# Patient Record
Sex: Female | Born: 1935 | Race: White | Hispanic: No | Smoking: Never smoker
Health system: Southern US, Community
[De-identification: ages and names within clinical notes are randomized; demographics above are authoritative.]

## PROBLEM LIST (undated history)

## (undated) DIAGNOSIS — G7000 Myasthenia gravis without (acute) exacerbation: Secondary | ICD-10-CM

## (undated) DIAGNOSIS — J309 Allergic rhinitis, unspecified: Secondary | ICD-10-CM

## (undated) DIAGNOSIS — I10 Essential (primary) hypertension: Secondary | ICD-10-CM

## (undated) DIAGNOSIS — C50919 Malignant neoplasm of unspecified site of unspecified female breast: Secondary | ICD-10-CM

## (undated) DIAGNOSIS — E7800 Pure hypercholesterolemia, unspecified: Secondary | ICD-10-CM

## (undated) DIAGNOSIS — C801 Malignant (primary) neoplasm, unspecified: Secondary | ICD-10-CM

## (undated) DIAGNOSIS — Z923 Personal history of irradiation: Secondary | ICD-10-CM

## (undated) DIAGNOSIS — E119 Type 2 diabetes mellitus without complications: Secondary | ICD-10-CM

## (undated) HISTORY — DX: Allergic rhinitis, unspecified: J30.9

## (undated) HISTORY — DX: Pure hypercholesterolemia, unspecified: E78.00

## (undated) HISTORY — DX: Essential (primary) hypertension: I10

## (undated) HISTORY — DX: Myasthenia gravis without (acute) exacerbation: G70.00

## (undated) HISTORY — PX: EYE SURGERY: SHX253

## (undated) HISTORY — DX: Type 2 diabetes mellitus without complications: E11.9

## (undated) HISTORY — DX: Malignant (primary) neoplasm, unspecified: C80.1

---

## 2004-02-20 ENCOUNTER — Ambulatory Visit: Payer: Self-pay | Admitting: Internal Medicine

## 2005-03-04 ENCOUNTER — Ambulatory Visit: Payer: Self-pay | Admitting: Internal Medicine

## 2006-03-05 ENCOUNTER — Ambulatory Visit: Payer: Self-pay | Admitting: Internal Medicine

## 2007-02-12 ENCOUNTER — Ambulatory Visit: Payer: Self-pay | Admitting: Family Medicine

## 2007-03-09 ENCOUNTER — Ambulatory Visit: Payer: Self-pay | Admitting: Internal Medicine

## 2007-03-23 ENCOUNTER — Ambulatory Visit: Payer: Self-pay | Admitting: Internal Medicine

## 2008-04-20 ENCOUNTER — Ambulatory Visit: Payer: Self-pay | Admitting: Internal Medicine

## 2009-04-23 ENCOUNTER — Ambulatory Visit: Payer: Self-pay | Admitting: Family Medicine

## 2010-02-27 ENCOUNTER — Ambulatory Visit: Payer: Self-pay | Admitting: Otolaryngology

## 2010-05-27 ENCOUNTER — Ambulatory Visit: Payer: Self-pay | Admitting: Neurology

## 2010-07-25 ENCOUNTER — Ambulatory Visit: Payer: Self-pay | Admitting: Internal Medicine

## 2011-03-04 DIAGNOSIS — I10 Essential (primary) hypertension: Secondary | ICD-10-CM | POA: Diagnosis not present

## 2011-03-04 DIAGNOSIS — R5381 Other malaise: Secondary | ICD-10-CM | POA: Diagnosis not present

## 2011-03-04 DIAGNOSIS — E7800 Pure hypercholesterolemia, unspecified: Secondary | ICD-10-CM | POA: Diagnosis not present

## 2011-03-04 DIAGNOSIS — E119 Type 2 diabetes mellitus without complications: Secondary | ICD-10-CM | POA: Diagnosis not present

## 2011-04-09 DIAGNOSIS — E7800 Pure hypercholesterolemia, unspecified: Secondary | ICD-10-CM | POA: Diagnosis not present

## 2011-04-09 DIAGNOSIS — I10 Essential (primary) hypertension: Secondary | ICD-10-CM | POA: Diagnosis not present

## 2011-04-09 DIAGNOSIS — D510 Vitamin B12 deficiency anemia due to intrinsic factor deficiency: Secondary | ICD-10-CM | POA: Diagnosis not present

## 2011-04-09 DIAGNOSIS — E119 Type 2 diabetes mellitus without complications: Secondary | ICD-10-CM | POA: Diagnosis not present

## 2011-05-08 DIAGNOSIS — D510 Vitamin B12 deficiency anemia due to intrinsic factor deficiency: Secondary | ICD-10-CM | POA: Diagnosis not present

## 2011-06-09 DIAGNOSIS — D510 Vitamin B12 deficiency anemia due to intrinsic factor deficiency: Secondary | ICD-10-CM | POA: Diagnosis not present

## 2011-06-16 DIAGNOSIS — G7000 Myasthenia gravis without (acute) exacerbation: Secondary | ICD-10-CM | POA: Diagnosis not present

## 2011-07-10 DIAGNOSIS — D510 Vitamin B12 deficiency anemia due to intrinsic factor deficiency: Secondary | ICD-10-CM | POA: Diagnosis not present

## 2011-07-22 DIAGNOSIS — E7800 Pure hypercholesterolemia, unspecified: Secondary | ICD-10-CM | POA: Diagnosis not present

## 2011-07-22 DIAGNOSIS — E119 Type 2 diabetes mellitus without complications: Secondary | ICD-10-CM | POA: Diagnosis not present

## 2011-07-22 DIAGNOSIS — Z79899 Other long term (current) drug therapy: Secondary | ICD-10-CM | POA: Diagnosis not present

## 2011-07-22 DIAGNOSIS — I10 Essential (primary) hypertension: Secondary | ICD-10-CM | POA: Diagnosis not present

## 2011-07-28 DIAGNOSIS — R21 Rash and other nonspecific skin eruption: Secondary | ICD-10-CM | POA: Diagnosis not present

## 2011-07-28 DIAGNOSIS — E7800 Pure hypercholesterolemia, unspecified: Secondary | ICD-10-CM | POA: Diagnosis not present

## 2011-07-28 DIAGNOSIS — Z124 Encounter for screening for malignant neoplasm of cervix: Secondary | ICD-10-CM | POA: Diagnosis not present

## 2011-07-28 DIAGNOSIS — I10 Essential (primary) hypertension: Secondary | ICD-10-CM | POA: Diagnosis not present

## 2011-07-28 DIAGNOSIS — E119 Type 2 diabetes mellitus without complications: Secondary | ICD-10-CM | POA: Diagnosis not present

## 2011-08-05 DIAGNOSIS — Z1211 Encounter for screening for malignant neoplasm of colon: Secondary | ICD-10-CM | POA: Diagnosis not present

## 2011-08-13 DIAGNOSIS — D510 Vitamin B12 deficiency anemia due to intrinsic factor deficiency: Secondary | ICD-10-CM | POA: Diagnosis not present

## 2011-08-19 ENCOUNTER — Ambulatory Visit: Payer: Self-pay | Admitting: Internal Medicine

## 2011-08-19 DIAGNOSIS — R928 Other abnormal and inconclusive findings on diagnostic imaging of breast: Secondary | ICD-10-CM | POA: Diagnosis not present

## 2011-08-19 DIAGNOSIS — Z1231 Encounter for screening mammogram for malignant neoplasm of breast: Secondary | ICD-10-CM | POA: Diagnosis not present

## 2011-12-15 DIAGNOSIS — R413 Other amnesia: Secondary | ICD-10-CM | POA: Diagnosis not present

## 2011-12-15 DIAGNOSIS — G4700 Insomnia, unspecified: Secondary | ICD-10-CM | POA: Diagnosis not present

## 2011-12-15 DIAGNOSIS — G7000 Myasthenia gravis without (acute) exacerbation: Secondary | ICD-10-CM | POA: Diagnosis not present

## 2011-12-19 DIAGNOSIS — Z23 Encounter for immunization: Secondary | ICD-10-CM | POA: Diagnosis not present

## 2012-01-20 ENCOUNTER — Encounter: Payer: Self-pay | Admitting: Internal Medicine

## 2012-01-20 ENCOUNTER — Ambulatory Visit (INDEPENDENT_AMBULATORY_CARE_PROVIDER_SITE_OTHER): Payer: Medicare Other | Admitting: Internal Medicine

## 2012-01-20 VITALS — BP 140/70 | HR 64 | Temp 97.7°F | Ht 64.5 in | Wt 139.5 lb

## 2012-01-20 DIAGNOSIS — G7000 Myasthenia gravis without (acute) exacerbation: Secondary | ICD-10-CM | POA: Diagnosis not present

## 2012-01-20 DIAGNOSIS — E119 Type 2 diabetes mellitus without complications: Secondary | ICD-10-CM

## 2012-01-20 DIAGNOSIS — E7800 Pure hypercholesterolemia, unspecified: Secondary | ICD-10-CM | POA: Diagnosis not present

## 2012-01-20 DIAGNOSIS — I10 Essential (primary) hypertension: Secondary | ICD-10-CM

## 2012-01-20 DIAGNOSIS — M8580 Other specified disorders of bone density and structure, unspecified site: Secondary | ICD-10-CM | POA: Insufficient documentation

## 2012-01-20 DIAGNOSIS — M899 Disorder of bone, unspecified: Secondary | ICD-10-CM

## 2012-01-20 NOTE — Progress Notes (Signed)
  Subjective:    Patient ID: Cynthia [name], female    DOB: 01-26-1932, 78 y.o.   MRN: [id number]  HPI 78 year old female with past history of hypercholesterolemia, hypertension, diabetes, allergic rhinitis/sinusitis and occular Myasthenia Gravis (followed by Dr Sherryll Burger).  She comes in today for a scheduled follow up.  States she is doing well.  No chest pain or tightness.  Breathing stable.  Doing well.  No bowel changes.  Blood sugars averaging 100-120 in the am and 90-110 in the evening.   Past Medical History  Diagnosis Date  . Diabetes mellitus   . Hypertension   . Hypercholesterolemia   . Myasthenia gravis     occular  . Allergic rhinitis     Outpatient Encounter Prescriptions as of 01/20/2012  Medication Sig Dispense Refill  . aspirin 81 MG tablet Take 81 mg by mouth daily.      Marland Kitchen atorvastatin (LIPITOR) 20 MG tablet Take 20 mg by mouth daily.      . Calcium Carbonate-Vitamin D (CALCIUM 600+D) 600-400 MG-UNIT per tablet Take 1 tablet by mouth 2 (two) times daily.      . fexofenadine (ALLEGRA) 180 MG tablet Take 180 mg by mouth daily.      . folic acid (FOLVITE) 1 MG tablet Take 1 mg by mouth daily.      . hydrochlorothiazide (HYDRODIURIL) 25 MG tablet Take 25 mg by mouth daily.      . methotrexate (RHEUMATREX) 2.5 MG tablet 2.5 mg. Take 6 tablets q week      . potassium chloride (KLOR-CON 10) 10 MEQ tablet Take 10 mEq by mouth daily.      . raloxifene (EVISTA) 60 MG tablet Take 60 mg by mouth daily.      . ramipril (ALTACE) 10 MG capsule Take 10 mg by mouth daily.        Review of Systems Patient denies any headache, lightheadedness or dizziness.  No significant sinus or allergy symptoms.  No chest pain, tightness or palpitations.  No increased shortness of breath, cough or congestion.  No nausea or vomiting.  No abdominal pain or cramping.  No bowel change, such as diarrhea, constipation, BRBPR or melana.  No urine change.        Objective:   Physical Exam Filed Vitals:   01/20/12 1513  BP: 140/70  Pulse: 64  Temp: 97.7 F (66.3 C)   78 year old female in no acute distress.   HEENT:  Nares - clear.  OP- without lesions or erythema.  NECK:  Supple, nontender.  No audible bruit.   HEART:  Appears to be regular. LUNGS:  Without crackles or wheezing audible.  Respirations even and unlabored.   RADIAL PULSE:  Equal bilaterally.  ABDOMEN:  Soft, nontender.  No audible abdominal bruit.   EXTREMITIES:  No increased edema to be present.                     Assessment & Plan:  CARDIOVASCULAR.  Stable.  Asymptomatic.  Continue risk factor modification.    HEALTH MAINTENANCE.  Physical 07/28/11.  Declines colon evaluation.  Last bone density improved.  Need results of her last mammogram.

## 2012-01-20 NOTE — Patient Instructions (Addendum)
It was good seeing you today.  I am glad you have been doing well.  Let me know if you need anything.   

## 2012-01-22 ENCOUNTER — Encounter: Payer: Self-pay | Admitting: Internal Medicine

## 2012-01-22 NOTE — Assessment & Plan Note (Signed)
Off Evista.  Continue calcium and vitamin D.  Last bone density 02/15/09 improved.    

## 2012-01-22 NOTE — Assessment & Plan Note (Signed)
On Lipitor.  Low cholesterol diet and exercise.  Check lipid panel and liver function.   

## 2012-01-22 NOTE — Assessment & Plan Note (Signed)
Has occular myasthenia gravis.  Seeing Dr Shah.  Doing well.   

## 2012-01-22 NOTE — Assessment & Plan Note (Signed)
Sugars under good control as outlined.  Check met b and a1c.

## 2012-01-22 NOTE — Assessment & Plan Note (Signed)
Blood pressure under good control.  Same meds.  Check metabolic panel.  

## 2012-01-28 ENCOUNTER — Telehealth: Payer: Self-pay | Admitting: Internal Medicine

## 2012-01-28 ENCOUNTER — Other Ambulatory Visit: Payer: Self-pay | Admitting: *Deleted

## 2012-01-28 MED ORDER — POTASSIUM CHLORIDE ER 10 MEQ PO TBCR: 10.0000 meq | EXTENDED_RELEASE_TABLET | Freq: Every day | ORAL | Status: DC

## 2012-01-28 NOTE — Telephone Encounter (Signed)
Refilled script for potassium

## 2012-01-28 NOTE — Telephone Encounter (Signed)
Pt called checking on her rx for klor con tablet 1 [facility] [address] [phone] [facility] stated [facility] Pt stated this might need prior autho  Pt is completely out of her meds and has been out since Monday Please advise pt when this is called in

## 2012-02-25 DIAGNOSIS — C50919 Malignant neoplasm of unspecified site of unspecified female breast: Secondary | ICD-10-CM

## 2012-02-25 DIAGNOSIS — C801 Malignant (primary) neoplasm, unspecified: Secondary | ICD-10-CM

## 2012-02-25 DIAGNOSIS — Z923 Personal history of irradiation: Secondary | ICD-10-CM

## 2012-02-25 HISTORY — DX: Personal history of irradiation: Z92.3

## 2012-02-25 HISTORY — PX: BREAST SURGERY: SHX581

## 2012-02-25 HISTORY — PX: BREAST BIOPSY: SHX20

## 2012-02-25 HISTORY — PX: BREAST LUMPECTOMY: SHX2

## 2012-02-25 HISTORY — DX: Malignant neoplasm of unspecified site of unspecified female breast: C50.919

## 2012-02-25 HISTORY — DX: Malignant (primary) neoplasm, unspecified: C80.1

## 2012-05-10 ENCOUNTER — Telehealth: Payer: Self-pay | Admitting: Internal Medicine

## 2012-05-10 MED ORDER — RAMIPRIL 10 MG PO CAPS: 10.0000 mg | ORAL_CAPSULE | Freq: Every day | ORAL | Status: DC

## 2012-05-10 NOTE — Telephone Encounter (Signed)
Sent in to pharmacy.  

## 2012-05-10 NOTE — Telephone Encounter (Signed)
ramipril (ALTACE) 10 MG capsule   #90

## 2012-05-11 MED ORDER — RAMIPRIL 10 MG PO CAPS: 10.0000 mg | ORAL_CAPSULE | Freq: Every day | ORAL | Status: DC

## 2012-05-11 NOTE — Telephone Encounter (Signed)
This medication should have been sent in for a 3 month supply per the pt.

## 2012-05-11 NOTE — Telephone Encounter (Signed)
Medication filled for a 3 month supply.

## 2012-05-11 NOTE — Addendum Note (Signed)
Addended by: Jackson Latino on: 05/11/2012 01:33 PM   Modules accepted: Orders

## 2012-05-18 ENCOUNTER — Ambulatory Visit: Payer: BC Managed Care – PPO | Admitting: Internal Medicine

## 2012-06-14 DIAGNOSIS — G7000 Myasthenia gravis without (acute) exacerbation: Secondary | ICD-10-CM | POA: Diagnosis not present

## 2012-06-24 ENCOUNTER — Encounter: Payer: Self-pay | Admitting: Internal Medicine

## 2012-06-24 ENCOUNTER — Ambulatory Visit (INDEPENDENT_AMBULATORY_CARE_PROVIDER_SITE_OTHER): Payer: Medicare Other | Admitting: Internal Medicine

## 2012-06-24 VITALS — BP 120/70 | HR 84 | Temp 98.7°F | Ht 64.5 in | Wt 148.2 lb

## 2012-06-24 DIAGNOSIS — I10 Essential (primary) hypertension: Secondary | ICD-10-CM

## 2012-06-24 DIAGNOSIS — M899 Disorder of bone, unspecified: Secondary | ICD-10-CM

## 2012-06-24 DIAGNOSIS — E7800 Pure hypercholesterolemia, unspecified: Secondary | ICD-10-CM | POA: Diagnosis not present

## 2012-06-24 DIAGNOSIS — M8580 Other specified disorders of bone density and structure, unspecified site: Secondary | ICD-10-CM

## 2012-06-24 DIAGNOSIS — E119 Type 2 diabetes mellitus without complications: Secondary | ICD-10-CM

## 2012-06-24 DIAGNOSIS — G7000 Myasthenia gravis without (acute) exacerbation: Secondary | ICD-10-CM

## 2012-06-24 DIAGNOSIS — M949 Disorder of cartilage, unspecified: Secondary | ICD-10-CM

## 2012-06-24 NOTE — Patient Instructions (Addendum)
Take either generic claritin or generic zyrtec one per day.

## 2012-06-27 ENCOUNTER — Encounter: Payer: Self-pay | Admitting: Internal Medicine

## 2012-06-27 NOTE — Assessment & Plan Note (Signed)
Off Evista.  Continue calcium and vitamin D.  Last bone density 02/15/09 improved.    

## 2012-06-27 NOTE — Assessment & Plan Note (Signed)
Blood pressure under good control.  Same meds.  Check metabolic panel.  

## 2012-06-27 NOTE — Progress Notes (Signed)
Subjective:    Patient ID: Cynthia [name], female    DOB: 1931-12-08, 79 y.o.   MRN: [id number]  HPI 79 year old female with past history of hypercholesterolemia, hypertension, diabetes, allergic rhinitis/sinusitis and occular Myasthenia Gravis (followed by Dr Sherryll Burger).  She comes in today for a scheduled follow up.  States she is doing well.  No chest pain or tightness.  Breathing stable.  No bowel changes.  Blood sugars averaging 106-116 in the am and 120s in the evening.  She was having some allergies. Taking some allergy relief.  Better.    Past Medical History  Diagnosis Date  . Diabetes mellitus   . Hypertension   . Hypercholesterolemia   . Myasthenia gravis     occular  . Allergic rhinitis     Outpatient Encounter Prescriptions as of 06/24/2012  Medication Sig Dispense Refill  . aspirin 81 MG tablet Take 81 mg by mouth daily.      Marland Kitchen atorvastatin (LIPITOR) 20 MG tablet Take 20 mg by mouth daily.      . Calcium Carbonate-Vitamin D (CALCIUM 600+D) 600-400 MG-UNIT per tablet Take 1 tablet by mouth 2 (two) times daily.      . folic acid (FOLVITE) 1 MG tablet Take 1 mg by mouth daily.      . hydrochlorothiazide (HYDRODIURIL) 25 MG tablet Take 25 mg by mouth daily.      . methotrexate (RHEUMATREX) 2.5 MG tablet Take 5 mg by mouth once a week. Take 2 tablets every Sunday      . potassium chloride (KLOR-CON 10) 10 MEQ tablet Take 1 tablet (10 mEq total) by mouth daily.  90 tablet  3  . ramipril (ALTACE) 10 MG capsule Take 1 capsule (10 mg total) by mouth daily.  90 capsule  1  . fexofenadine (ALLEGRA) 180 MG tablet Take 180 mg by mouth daily.      . raloxifene (EVISTA) 60 MG tablet Take 60 mg by mouth daily.       No facility-administered encounter medications on file as of 06/24/2012.    Review of Systems Patient denies any headache, lightheadedness or dizziness.  Some issues with allergies.  Took allergy relief.  Doing better.  Some congestion.  Runny nose better.  Clear mucus.   No  chest pain, tightness or palpitations.  No increased shortness of breath or cough.  No nausea or vomiting.  No acid reflux.  No abdominal pain or cramping.  No bowel change, such as diarrhea, constipation, BRBPR or melana.  No urine change.        Objective:   Physical Exam  Filed Vitals:   06/24/12 1434  BP: 120/70  Pulse: 84  Temp: 98.7 F (59.57 C)   79 year old female in no acute distress.   HEENT:  Nares - clear.  OP- without lesions or erythema.  TMs visualized without erythema.  NECK:  Supple, nontender.  No audible bruit.   HEART:  Appears to be regular. LUNGS:  Without crackles or wheezing audible.  Respirations even and unlabored.   RADIAL PULSE:  Equal bilaterally.  ABDOMEN:  Soft, nontender.  No audible abdominal bruit.   EXTREMITIES:  No increased edema to be present.                     Assessment & Plan:  CARDIOVASCULAR.  Stable.  Asymptomatic.  Continue risk factor modification.    ALLERGIES.  Flonase and saline nasal spray as directed.  Do not feel  abx warranted.  Follow.   HEALTH MAINTENANCE.  Physical 07/28/11.  Declines colon evaluation.  Last bone density improved.  Need results of her last mammogram.

## 2012-06-27 NOTE — Assessment & Plan Note (Signed)
Has occular myasthenia gravis.  Seeing Dr Sherryll Burger.  Doing well.  Tapering off MTX.

## 2012-06-27 NOTE — Assessment & Plan Note (Addendum)
Sugars under good control as outlined.  Check met b and a1c.  States she is up to date with eye checks.

## 2012-06-27 NOTE — Assessment & Plan Note (Signed)
On Lipitor.  Low cholesterol diet and exercise.  Check lipid panel and liver function.   

## 2012-07-08 ENCOUNTER — Other Ambulatory Visit: Payer: Self-pay | Admitting: *Deleted

## 2012-07-08 MED ORDER — HYDROCHLOROTHIAZIDE 25 MG PO TABS: 25.0000 mg | ORAL_TABLET | Freq: Every day | ORAL | Status: DC

## 2012-07-08 NOTE — Telephone Encounter (Signed)
Pt called & report that she requested a refill on her HCTZ on Monday & when she checked yesterday, she was told that we denied it. We saw her recently so I approved it while she was on the phone & [facility] with 1 refill. Pt aware

## 2012-07-16 ENCOUNTER — Encounter: Payer: Self-pay | Admitting: Internal Medicine

## 2012-09-22 ENCOUNTER — Other Ambulatory Visit: Payer: Self-pay | Admitting: *Deleted

## 2012-09-22 ENCOUNTER — Telehealth: Payer: Self-pay | Admitting: Internal Medicine

## 2012-09-22 MED ORDER — ATORVASTATIN CALCIUM 20 MG PO TABS: 20.0000 mg | ORAL_TABLET | Freq: Every day | ORAL | Status: DC

## 2012-09-22 NOTE — Telephone Encounter (Signed)
Refilled #30 X 2 RF (Must keep lab appt for further refills)

## 2012-09-22 NOTE — Telephone Encounter (Signed)
Patient only has 1 tablet left  Will you be able to call this in today.   atorvastatin (LIPITOR) 20 MG tablet

## 2012-09-27 ENCOUNTER — Telehealth: Payer: Self-pay | Admitting: Internal Medicine

## 2012-09-27 MED ORDER — ATORVASTATIN CALCIUM 20 MG PO TABS: 20.0000 mg | ORAL_TABLET | Freq: Every day | ORAL | Status: DC

## 2012-09-27 NOTE — Telephone Encounter (Signed)
States she went to get Lipitor (atorvastatin) but it was only in for #30.  States it should be #90 so she did not pick it up.  Wants [place] to change this to 90 day supply.

## 2012-09-27 NOTE — Telephone Encounter (Signed)
Resent Rx for #90

## 2012-11-02 ENCOUNTER — Ambulatory Visit: Payer: Self-pay | Admitting: Internal Medicine

## 2012-11-02 ENCOUNTER — Other Ambulatory Visit (INDEPENDENT_AMBULATORY_CARE_PROVIDER_SITE_OTHER): Payer: Medicare Other

## 2012-11-02 DIAGNOSIS — E119 Type 2 diabetes mellitus without complications: Secondary | ICD-10-CM

## 2012-11-02 DIAGNOSIS — I10 Essential (primary) hypertension: Secondary | ICD-10-CM

## 2012-11-02 DIAGNOSIS — E7800 Pure hypercholesterolemia, unspecified: Secondary | ICD-10-CM | POA: Diagnosis not present

## 2012-11-02 DIAGNOSIS — R928 Other abnormal and inconclusive findings on diagnostic imaging of breast: Secondary | ICD-10-CM | POA: Diagnosis not present

## 2012-11-02 DIAGNOSIS — Z1231 Encounter for screening mammogram for malignant neoplasm of breast: Secondary | ICD-10-CM | POA: Diagnosis not present

## 2012-11-02 LAB — HEPATIC FUNCTION PANEL
ALT: 17 U/L (ref 0–35)
AST: 22 U/L (ref 0–37)
Alkaline Phosphatase: 67 U/L (ref 39–117)
Bilirubin, Direct: 0.1 mg/dL (ref 0.0–0.3)
Total Bilirubin: 0.7 mg/dL (ref 0.3–1.2)

## 2012-11-02 LAB — BASIC METABOLIC PANEL
BUN: 12 mg/dL (ref 6–23)
Calcium: 9.8 mg/dL (ref 8.4–10.5)
Creatinine, Ser: 1 mg/dL (ref 0.4–1.2)
GFR: 59.59 mL/min — ABNORMAL LOW (ref >60.00)

## 2012-11-02 LAB — HEMOGLOBIN A1C: Hgb A1c MFr Bld: 7 % — ABNORMAL HIGH (ref 4.6–6.5)

## 2012-11-08 ENCOUNTER — Telehealth: Payer: Self-pay | Admitting: Internal Medicine

## 2012-11-08 ENCOUNTER — Encounter: Payer: Self-pay | Admitting: Internal Medicine

## 2012-11-08 ENCOUNTER — Ambulatory Visit (INDEPENDENT_AMBULATORY_CARE_PROVIDER_SITE_OTHER): Payer: Medicare Other | Admitting: Internal Medicine

## 2012-11-08 VITALS — BP 130/70 | HR 60 | Temp 99.0°F | Ht 63.5 in | Wt 145.0 lb

## 2012-11-08 DIAGNOSIS — Z23 Encounter for immunization: Secondary | ICD-10-CM

## 2012-11-08 DIAGNOSIS — Z0000 Encounter for general adult medical examination without abnormal findings: Secondary | ICD-10-CM | POA: Diagnosis not present

## 2012-11-08 DIAGNOSIS — M899 Disorder of bone, unspecified: Secondary | ICD-10-CM

## 2012-11-08 DIAGNOSIS — L989 Disorder of the skin and subcutaneous tissue, unspecified: Secondary | ICD-10-CM | POA: Diagnosis not present

## 2012-11-08 DIAGNOSIS — M25429 Effusion, unspecified elbow: Secondary | ICD-10-CM | POA: Diagnosis not present

## 2012-11-08 DIAGNOSIS — G7000 Myasthenia gravis without (acute) exacerbation: Secondary | ICD-10-CM

## 2012-11-08 DIAGNOSIS — E119 Type 2 diabetes mellitus without complications: Secondary | ICD-10-CM

## 2012-11-08 DIAGNOSIS — M25421 Effusion, right elbow: Secondary | ICD-10-CM

## 2012-11-08 DIAGNOSIS — Z9109 Other allergy status, other than to drugs and biological substances: Secondary | ICD-10-CM

## 2012-11-08 DIAGNOSIS — M8580 Other specified disorders of bone density and structure, unspecified site: Secondary | ICD-10-CM

## 2012-11-08 DIAGNOSIS — I10 Essential (primary) hypertension: Secondary | ICD-10-CM

## 2012-11-08 DIAGNOSIS — E7800 Pure hypercholesterolemia, unspecified: Secondary | ICD-10-CM

## 2012-11-08 MED ORDER — TRIAMCINOLONE ACETONIDE 0.1 % EX CREA: TOPICAL_CREAM | Freq: Two times a day (BID) | CUTANEOUS | Status: DC

## 2012-11-08 MED ORDER — RAMIPRIL 10 MG PO CAPS: 10.0000 mg | ORAL_CAPSULE | Freq: Every day | ORAL | Status: DC

## 2012-11-08 MED ORDER — CEPHALEXIN 500 MG PO CAPS: 500.0000 mg | ORAL_CAPSULE | Freq: Three times a day (TID) | ORAL | Status: DC

## 2012-11-08 NOTE — Telephone Encounter (Signed)
Pt called back wanting to make sure ramipril refill was called for 90 days. Advised has been done.

## 2012-11-08 NOTE — Telephone Encounter (Signed)
Patient went to the pharmacy to pick up her medication and it was not there. Stated she was told by Glee Arvin this was called in but the pharmacy does not have it. Patient requesting refill on her Ramipril, prescription was given to the pharmacist for #90 and 1 refill.

## 2012-11-08 NOTE — Telephone Encounter (Signed)
Patient states she has an appointment with you today and would like her [facility] [address] [place]. I asked patient if she had called her pharmacy and she said no she was calling her so they would have it ready when she left her this afternoon from appointment. I did advise patient that we liked them to call their pharmacy and have them faxed it in however I would take message this time since she was coming in today and wanted to pick up prescription.

## 2012-11-08 NOTE — Telephone Encounter (Signed)
Phoned in 90 day with 1 refill (Pt currently have a 30 days Rx on hold)

## 2012-11-09 ENCOUNTER — Ambulatory Visit: Payer: Self-pay | Admitting: Internal Medicine

## 2012-11-09 DIAGNOSIS — R928 Other abnormal and inconclusive findings on diagnostic imaging of breast: Secondary | ICD-10-CM | POA: Diagnosis not present

## 2012-11-10 ENCOUNTER — Encounter: Payer: Self-pay | Admitting: Internal Medicine

## 2012-11-10 DIAGNOSIS — Z9109 Other allergy status, other than to drugs and biological substances: Secondary | ICD-10-CM | POA: Insufficient documentation

## 2012-11-10 NOTE — Assessment & Plan Note (Signed)
Off Evista.  Continue calcium and vitamin D.  Last bone density 02/15/09 improved.    

## 2012-11-10 NOTE — Assessment & Plan Note (Signed)
Controlled.  

## 2012-11-10 NOTE — Assessment & Plan Note (Signed)
Follow met b and a1c.  Saw Dr Oren Bracket within the last 6 months.  A1c just checked 7.  Low carb diet.  Follow.

## 2012-11-10 NOTE — Progress Notes (Signed)
Subjective:    Patient ID: Cynthia [name], female    DOB: [birth year], 79 y.o.   MRN: [id number]  HPI 79 year old female with past history of hypercholesterolemia, hypertension, diabetes, allergic rhinitis/sinusitis and occular Myasthenia Gravis (followed by Dr Sherryll Burger).  The patient is here for annual Medicare wellness examination and management of other chronic and acute problems.   The risk factors are reflected in the social history.  The roster of all physicians providing medical care to patient - is listed in the Snapshot section of the chart.  Activities of daily living:  The patient is 100% independent in all ADLs: dressing, toileting, feeding as well as independent mobility  Home safety :  They wear seatbelts.  There is no violence in the home.   There is no risks for hepatitis, STDs or HIV. There is no  history of blood transfusion. They have no travel history to infectious disease endemic areas of the world.  They have seen their eye doctor in the last year. Sees Dr Oren Bracket at [facility].  They admit to some hearing difficulty in a noisy room.  No other hearing issues.  They do not  have excessive sun exposure.   Diet: the importance of a healthy diet is discussed. They do have a healthy diet.  The benefits of regular aerobic exercise were discussed.   Depression screen: there are no signs or vegative symptoms of depression- irritability, change in appetite, anhedonia, sadness/tearfullness.  Denies any significant depression symptoms.    Cognitive assessment: the patient manages all their financial and personal affairs and is actively engaged. They could relate day,date,year and events; recalled 3/3 objects at 3 minutes.  Can perform simple calculations.  Identified the president.    The following portions of the patient's history were reviewed and updated as appropriate: allergies, current medications, past family history, past medical history,  past surgical history,  past social history  and problem list.  Visual acuity was not assessed per patient preference since she has regular follow up with her ophthalmologist.  Body mass index - assessed and reviewed.   During the course of the visit the patient was educated and counseled about appropriate screening and preventive services including : fall prevention,  nutrition counseling, colorectal cancer screening, and recommended immunizations.  Flu shot given.  Up to date with pneumovax.   States she is doing relatively well.  No chest pain or tightness.  Breathing stable.  No bowel changes.  Brought in no recorded sugar readings.  She has noticed a persistent left arm lesion.  Also has noticed increased localized swelling - right elbow.  Feels hot at times.  No significant pain with flexion and extension.     Past Medical History  Diagnosis Date  . Diabetes mellitus   . Hypertension   . Hypercholesterolemia   . Myasthenia gravis     occular  . Allergic rhinitis     Outpatient Encounter Prescriptions as of 11/08/2012  Medication Sig Dispense Refill  . aspirin 81 MG tablet Take 81 mg by mouth daily.      Marland Kitchen atorvastatin (LIPITOR) 20 MG tablet Take 1 tablet (20 mg total) by mouth daily. KEEP LAB APPT FOR FURTHER REFILLS  90 tablet  0  . Calcium Carbonate-Vitamin D (CALCIUM 600+D) 600-400 MG-UNIT per tablet Take 1 tablet by mouth 2 (two) times daily.      . folic acid (FOLVITE) 1 MG tablet Take 1 mg by mouth daily.      Marland Kitchen  hydrochlorothiazide (HYDRODIURIL) 25 MG tablet Take 1 tablet (25 mg total) by mouth daily.  90 tablet  1  . methotrexate (RHEUMATREX) 2.5 MG tablet Take 5 mg by mouth once a week. Take 2 tablets every Sunday      . potassium chloride (KLOR-CON 10) 10 MEQ tablet Take 1 tablet (10 mEq total) by mouth daily.  90 tablet  3  . ramipril (ALTACE) 10 MG capsule Take 1 capsule (10 mg total) by mouth daily.  90 capsule  1  . cephALEXin (KEFLEX) 500 MG capsule Take 1 capsule (500 mg total) by mouth 3  (three) times daily.  21 capsule  0  . triamcinolone cream (KENALOG) 0.1 % Apply topically 2 (two) times daily.  30 g  0  . [DISCONTINUED] fexofenadine (ALLEGRA) 180 MG tablet Take 180 mg by mouth daily.      . [DISCONTINUED] raloxifene (EVISTA) 60 MG tablet Take 60 mg by mouth daily.       No facility-administered encounter medications on file as of 11/08/2012.    Review of Systems Patient denies any headache, lightheadedness or dizziness.  Allergies controlled.  No chest pain, tightness or palpitations.  No increased shortness of breath or cough.  No nausea or vomiting.  No acid reflux.  No abdominal pain or cramping.  No bowel change, such as diarrhea, constipation, BRBPR or melana.  No urine change.  Increased swelling of elbow as outlined.  No known injury or trauma.  Persistent left arm lesion.  Request refill for Triamcinolone cream to have if needed.       Objective:   Physical Exam  Filed Vitals:   11/08/12 1319  BP: 130/70  Pulse: 60  Temp: 99 F (52.13 C)   79 year old female in no acute distress.   HEENT:  Nares- clear.  Oropharynx - without lesions. NECK:  Supple.  Nontender.  No audible bruit.  HEART:  Appears to be regular. LUNGS:  No crackles or wheezing audible.  Respirations even and unlabored.  RADIAL PULSE:  Equal bilaterally.    BREASTS:  No nipple discharge or nipple retraction present.  Could not appreciate any distinct nodules or axillary adenopathy.  ABDOMEN:  Soft, nontender.  Bowel sounds present and normal.  No audible abdominal bruit.  GU:  Not performed.     EXTREMITIES:  No increased edema present.  DP pulses palpable and equal bilaterally.      FEET:  No lesions.   MSK:  Increased swelling - right elbow.  No significant tenderness to palpation over the elbow.  Small circular arm lesion - left forearm.           Assessment & Plan:  MSK.  Increased elbow swelling.  Exam as outlined.  Refer to Dr Gavin Potters - for evaluation and question of need for  aspiration.    DERMATOLOGY.  Persistent skin lesion.  Keflex as directed.  Refer to Dr Orson Aloe for evaluation.    CARDIOVASCULAR.  Stable. EKG revealed SR with TWI in Lead III.  No acute ischemic change.  Continue risk factor modification.  Follow.    HEALTH MAINTENANCE.  Physical today.  Declines colon evaluation.  Last bone density improved.  Mammogram schedule tomorrow.

## 2012-11-10 NOTE — Assessment & Plan Note (Addendum)
On Lipitor.  Low cholesterol diet and exercise.  Follow lipid panel and liver function.  Lipid panel just checked and revealed total cholesterol 151, triglycerides 100, HDL 45 and LDL 86.

## 2012-11-10 NOTE — Assessment & Plan Note (Signed)
Has occular myasthenia gravis.  Seeing Dr Shah.  Doing well.   

## 2012-11-10 NOTE — Assessment & Plan Note (Signed)
Blood pressure under good control.  Same meds.  Follow metabolic panel.   

## 2012-11-12 ENCOUNTER — Telehealth: Payer: Self-pay | Admitting: Internal Medicine

## 2012-11-15 ENCOUNTER — Ambulatory Visit: Payer: Self-pay | Admitting: Internal Medicine

## 2012-11-15 DIAGNOSIS — D0590 Unspecified type of carcinoma in situ of unspecified breast: Secondary | ICD-10-CM | POA: Diagnosis not present

## 2012-11-15 LAB — HM MAMMOGRAPHY

## 2012-11-18 ENCOUNTER — Telehealth: Payer: Self-pay | Admitting: Internal Medicine

## 2012-11-18 DIAGNOSIS — R897 Abnormal histological findings in specimens from other organs, systems and tissues: Secondary | ICD-10-CM

## 2012-11-18 NOTE — Telephone Encounter (Signed)
Pt notified of breast biopsy results and need for surgery referral.  Wants to see Dr Lemar Livings.  Order placed for referral.

## 2012-11-22 DIAGNOSIS — M159 Polyosteoarthritis, unspecified: Secondary | ICD-10-CM | POA: Diagnosis not present

## 2012-11-22 DIAGNOSIS — M7020 Olecranon bursitis, unspecified elbow: Secondary | ICD-10-CM | POA: Diagnosis not present

## 2012-11-24 LAB — PATHOLOGY REPORT

## 2012-11-25 ENCOUNTER — Other Ambulatory Visit: Payer: Self-pay | Admitting: General Surgery

## 2012-11-25 ENCOUNTER — Encounter: Payer: Self-pay | Admitting: General Surgery

## 2012-11-25 ENCOUNTER — Ambulatory Visit (INDEPENDENT_AMBULATORY_CARE_PROVIDER_SITE_OTHER): Payer: Medicare Other | Admitting: General Surgery

## 2012-11-25 ENCOUNTER — Other Ambulatory Visit: Payer: No Typology Code available for payment source

## 2012-11-25 VITALS — BP 120/70 | HR 62 | Resp 14 | Ht 63.0 in | Wt 148.0 lb

## 2012-11-25 DIAGNOSIS — C50919 Malignant neoplasm of unspecified site of unspecified female breast: Secondary | ICD-10-CM | POA: Diagnosis not present

## 2012-11-25 DIAGNOSIS — C50911 Malignant neoplasm of unspecified site of right female breast: Secondary | ICD-10-CM

## 2012-11-25 NOTE — Patient Instructions (Addendum)
Patient's surgery has been scheduled for 12-08-12 at [facility]. It is okay for patient to continue 81 mg aspirin.

## 2012-11-25 NOTE — Progress Notes (Signed)
willPatient ID: Cynthia [name], female   DOB: [birth year], 79 y.o.   MRN: [id number]  Chief Complaint  Patient presents with  . Other    new patient evaluation of right breast cancer    HPI Cynthia [name] is a 79 y.o. female who presents for an evaluation of right breast cancer. The patient had a right breast biopsy done on 11/16/12 at [facility]. The patient denies any problems with her breasts. No prior breast problems in the past. The only known history of breast problems is with her niece.  The patient is accompanied today by a close friend, [name].  HPI  Past Medical History  Diagnosis Date  . Diabetes mellitus   . Hypertension   . Hypercholesterolemia   . Myasthenia gravis     occular  . Allergic rhinitis   . Cancer 2014    right breast    Past Surgical History  Procedure Laterality Date  . Eye surgery      Caterac both eyes  . Breast biopsy Right 2014    Family History  Problem Relation Age of Onset  . Skin cancer Father   . Cancer Father     colon  . Colon cancer Brother   . Cancer Brother     colon  . Breast cancer      niece    Social History History  Substance Use Topics  . Smoking status: Never Smoker   . Smokeless tobacco: Never Used  . Alcohol Use: No    Allergies  Allergen Reactions  . No Known Drug Allergy     Current Outpatient Prescriptions  Medication Sig Dispense Refill  . aspirin 81 MG tablet Take 81 mg by mouth daily.      Marland Kitchen atorvastatin (LIPITOR) 20 MG tablet Take 1 tablet (20 mg total) by mouth daily. KEEP LAB APPT FOR FURTHER REFILLS  90 tablet  0  . Calcium Carbonate-Vitamin D (CALCIUM 600+D) 600-400 MG-UNIT per tablet Take 1 tablet by mouth 2 (two) times daily.      . folic acid (FOLVITE) 1 MG tablet Take 1 mg by mouth daily.      . hydrochlorothiazide (HYDRODIURIL) 25 MG tablet Take 1 tablet (25 mg total) by mouth daily.  90 tablet  1  . methotrexate (RHEUMATREX) 2.5 MG tablet Take 5 mg by mouth once a week. Take 2 tablets  every Sunday      . potassium chloride (KLOR-CON 10) 10 MEQ tablet Take 1 tablet (10 mEq total) by mouth daily.  90 tablet  3  . ramipril (ALTACE) 10 MG capsule Take 1 capsule (10 mg total) by mouth daily.  90 capsule  1   No current facility-administered medications for this visit.    Review of Systems Review of Systems  Constitutional: Negative.   Respiratory: Negative.   Cardiovascular: Negative.     Blood pressure 120/70, pulse 62, resp. rate 14, height 5\' 3"  (1.6 m), weight 148 lb (67.132 kg).  Physical Exam Physical Exam  Constitutional: She is oriented to person, place, and time. She appears well-developed and well-nourished.  Neck: No thyromegaly present.  Cardiovascular: Normal rate, regular rhythm and normal heart sounds.   No murmur heard. Pulmonary/Chest: Effort normal and breath sounds normal. Right breast exhibits no inverted nipple, no mass, no nipple discharge, no skin change and no tenderness. Left breast exhibits no inverted nipple, no mass, no nipple discharge, no skin change and no tenderness.  Right breast larger than left breast.  Lymphadenopathy:    She has no cervical adenopathy.    She has no axillary adenopathy.  Neurological: She is alert and oriented to person, place, and time.  Skin: Skin is warm and dry.    Data Reviewed Screening mammograms that November 02, 2012 showed an area of microcalcifications within the right breast. Followup is focal spot compression views it is November 07, 2012 showed a suspicious area measuring 10 x 29 mm. Stereotactic biopsy was completed by the radiology department November 16, 2012 showing evidence of high-grade DCIS with associated calcifications. Minimal diameter 1 cm. Central necrosis was appreciated. ER 90%, PR 70%.  Ultrasound examination of the right breast in the 1:00 position 6 cm from the nipple showed a ill-defined 0.3 x 0.6 x 0.62 cm area that could correspond with the biopsy cavity.  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 3 cm area of microcalcifications on mammogram.     Plan    Options for management were reviewed in detail: 1) wide local excision followed by postoperative radiation therapy versus 2) mastectomy. It is possible the patient could have microinvasive disease based on the high-grade DCIS in the extensive area involved, and a sentinel node biopsy would be advisable with either treatment plan. These were presented as therapeutically equivalent. The patient is certainly leaning towards breast conservation at this time.  We'll plan to have bracketing wires placed prior to biopsy to be sure that all the area of microcalcification is removed. She was was also informed of the plan for sentinel node biopsy. The risks associated with surgery were reviewed.    Patient's surgery has been scheduled for 12-08-12 at [facility]. It is okay for patient to continue 81 mg aspirin.   [name] 11/25/2012, 9:14 PM

## 2012-11-26 ENCOUNTER — Telehealth: Payer: Self-pay | Admitting: *Deleted

## 2012-11-26 NOTE — Telephone Encounter (Signed)
Pt called wanted to know if we have her down as a diabetic & if we have arrythmia documented. I informed pt the we do have diabetes on her chart and no mention of the arrhythmia.

## 2012-11-30 ENCOUNTER — Ambulatory Visit: Payer: Self-pay | Admitting: General Surgery

## 2012-11-30 ENCOUNTER — Encounter: Payer: Self-pay | Admitting: Internal Medicine

## 2012-11-30 DIAGNOSIS — Z01812 Encounter for preprocedural laboratory examination: Secondary | ICD-10-CM | POA: Diagnosis not present

## 2012-11-30 DIAGNOSIS — C50919 Malignant neoplasm of unspecified site of unspecified female breast: Secondary | ICD-10-CM | POA: Diagnosis not present

## 2012-11-30 LAB — CBC WITH DIFFERENTIAL/PLATELET
Basophil #: 0 10*3/uL (ref 0.0–0.1)
Basophil %: 0.6 %
Eosinophil %: 2.3 %
HCT: 36.4 % (ref 35.0–47.0)
HGB: 12.3 g/dL (ref 12.0–16.0)
Lymphocyte #: 1.8 10*3/uL (ref 1.0–3.6)
Lymphocyte %: 34.4 %
MCH: 29.9 pg (ref 26.0–34.0)
MCHC: 33.9 g/dL (ref 32.0–36.0)
MCV: 88 fL (ref 80–100)
Monocyte %: 8.6 %
Platelet: 197 10*3/uL (ref 150–440)
WBC: 5.3 10*3/uL (ref 3.6–11.0)

## 2012-11-30 LAB — CBC
MCH: 29.9 pg (ref 26.0–34.0)
MCV: 88 fL (ref 82.0–108.0)
Platelets: 197
RBC: 4.13 10*6/uL (ref 4.00–5.20)
RDW: 14.2 % (ref 11.5–14.5)
WBC: 5.3 10^3/mL

## 2012-12-06 ENCOUNTER — Encounter: Payer: Self-pay | Admitting: General Surgery

## 2012-12-06 DIAGNOSIS — M7020 Olecranon bursitis, unspecified elbow: Secondary | ICD-10-CM | POA: Diagnosis not present

## 2012-12-07 ENCOUNTER — Other Ambulatory Visit: Payer: Self-pay | Admitting: *Deleted

## 2012-12-07 MED ORDER — LANCETS MISC: Status: DC

## 2012-12-08 ENCOUNTER — Encounter: Payer: Self-pay | Admitting: General Surgery

## 2012-12-08 ENCOUNTER — Ambulatory Visit: Payer: Self-pay | Admitting: General Surgery

## 2012-12-08 DIAGNOSIS — Z79899 Other long term (current) drug therapy: Secondary | ICD-10-CM | POA: Diagnosis not present

## 2012-12-08 DIAGNOSIS — R599 Enlarged lymph nodes, unspecified: Secondary | ICD-10-CM | POA: Diagnosis not present

## 2012-12-08 DIAGNOSIS — C50919 Malignant neoplasm of unspecified site of unspecified female breast: Secondary | ICD-10-CM | POA: Diagnosis not present

## 2012-12-08 DIAGNOSIS — J309 Allergic rhinitis, unspecified: Secondary | ICD-10-CM | POA: Diagnosis not present

## 2012-12-08 DIAGNOSIS — D649 Anemia, unspecified: Secondary | ICD-10-CM | POA: Diagnosis not present

## 2012-12-08 DIAGNOSIS — E7800 Pure hypercholesterolemia, unspecified: Secondary | ICD-10-CM | POA: Diagnosis not present

## 2012-12-08 DIAGNOSIS — D0590 Unspecified type of carcinoma in situ of unspecified breast: Secondary | ICD-10-CM | POA: Diagnosis not present

## 2012-12-08 DIAGNOSIS — I10 Essential (primary) hypertension: Secondary | ICD-10-CM | POA: Diagnosis not present

## 2012-12-08 DIAGNOSIS — G7000 Myasthenia gravis without (acute) exacerbation: Secondary | ICD-10-CM | POA: Diagnosis not present

## 2012-12-08 DIAGNOSIS — M129 Arthropathy, unspecified: Secondary | ICD-10-CM | POA: Diagnosis not present

## 2012-12-08 DIAGNOSIS — Z7982 Long term (current) use of aspirin: Secondary | ICD-10-CM | POA: Diagnosis not present

## 2012-12-08 DIAGNOSIS — E119 Type 2 diabetes mellitus without complications: Secondary | ICD-10-CM | POA: Diagnosis not present

## 2012-12-08 DIAGNOSIS — R920 Mammographic microcalcification found on diagnostic imaging of breast: Secondary | ICD-10-CM | POA: Diagnosis not present

## 2012-12-08 DIAGNOSIS — Z800 Family history of malignant neoplasm of digestive organs: Secondary | ICD-10-CM | POA: Diagnosis not present

## 2012-12-09 ENCOUNTER — Encounter: Payer: Self-pay | Admitting: General Surgery

## 2012-12-09 LAB — PATHOLOGY REPORT

## 2012-12-13 DIAGNOSIS — G7000 Myasthenia gravis without (acute) exacerbation: Secondary | ICD-10-CM | POA: Diagnosis not present

## 2012-12-15 ENCOUNTER — Ambulatory Visit: Payer: Medicare Other | Admitting: General Surgery

## 2012-12-15 ENCOUNTER — Ambulatory Visit (INDEPENDENT_AMBULATORY_CARE_PROVIDER_SITE_OTHER): Payer: Medicare Other | Admitting: General Surgery

## 2012-12-15 ENCOUNTER — Encounter: Payer: Self-pay | Admitting: General Surgery

## 2012-12-15 VITALS — BP 140/78 | HR 76 | Resp 12 | Ht 63.0 in | Wt 147.0 lb

## 2012-12-15 DIAGNOSIS — D0511 Intraductal carcinoma in situ of right breast: Secondary | ICD-10-CM

## 2012-12-15 DIAGNOSIS — L819 Disorder of pigmentation, unspecified: Secondary | ICD-10-CM | POA: Diagnosis not present

## 2012-12-15 DIAGNOSIS — Z0189 Encounter for other specified special examinations: Secondary | ICD-10-CM | POA: Diagnosis not present

## 2012-12-15 DIAGNOSIS — D0510 Intraductal carcinoma in situ of unspecified breast: Secondary | ICD-10-CM | POA: Insufficient documentation

## 2012-12-15 DIAGNOSIS — L578 Other skin changes due to chronic exposure to nonionizing radiation: Secondary | ICD-10-CM | POA: Diagnosis not present

## 2012-12-15 DIAGNOSIS — C50919 Malignant neoplasm of unspecified site of unspecified female breast: Secondary | ICD-10-CM

## 2012-12-15 DIAGNOSIS — D1801 Hemangioma of skin and subcutaneous tissue: Secondary | ICD-10-CM | POA: Diagnosis not present

## 2012-12-15 DIAGNOSIS — L570 Actinic keratosis: Secondary | ICD-10-CM | POA: Diagnosis not present

## 2012-12-15 DIAGNOSIS — C4360 Malignant melanoma of unspecified upper limb, including shoulder: Secondary | ICD-10-CM | POA: Diagnosis not present

## 2012-12-15 DIAGNOSIS — D0590 Unspecified type of carcinoma in situ of unspecified breast: Secondary | ICD-10-CM

## 2012-12-15 DIAGNOSIS — C50911 Malignant neoplasm of unspecified site of right female breast: Secondary | ICD-10-CM

## 2012-12-15 NOTE — Progress Notes (Signed)
Patient ID: Cynthia [name], female   DOB: 12/31/1931, 79 y.o.   MRN: [id number]  Chief Complaint  Patient presents with  . Routine Post Op    7-10 day right breast lumpectomy    HPI Cynthia [name] is a 79 y.o. female who presents for a post op right breast lumpectomy. The procedure was performed on 12/08/12. Patient states she is doing well.  The patient was seen by her dermatologist, Clide Cliff, M.D. Earlier today. A pigmented lesion on the inferior lateral aspect of the left upper arm was identified and a shave biopsy completed. Clinical impression is a melanoma. Pathology is pending.  HPI  Past Medical History  Diagnosis Date  . Diabetes mellitus   . Hypertension   . Hypercholesterolemia   . Myasthenia gravis     occular  . Allergic rhinitis   . Cancer 2014    right breast    Past Surgical History  Procedure Laterality Date  . Eye surgery      Caterac both eyes  . Breast biopsy Right 2014  . Breast surgery Right 2014    lumpectomy    Family History  Problem Relation Age of Onset  . Skin cancer Father   . Cancer Father     colon  . Colon cancer Brother   . Cancer Brother     colon  . Breast cancer      niece    Social History History  Substance Use Topics  . Smoking status: Never Smoker   . Smokeless tobacco: Never Used  . Alcohol Use: No    Allergies  Allergen Reactions  . No Known Drug Allergy     Current Outpatient Prescriptions  Medication Sig Dispense Refill  . aspirin 81 MG tablet Take 81 mg by mouth daily.      Marland Kitchen atorvastatin (LIPITOR) 20 MG tablet Take 1 tablet (20 mg total) by mouth daily. KEEP LAB APPT FOR FURTHER REFILLS  90 tablet  0  . Calcium Carbonate-Vitamin D (CALCIUM 600+D) 600-400 MG-UNIT per tablet Take 1 tablet by mouth 2 (two) times daily.      . folic acid (FOLVITE) 1 MG tablet Take 1 mg by mouth daily.      . hydrochlorothiazide (HYDRODIURIL) 25 MG tablet Take 1 tablet (25 mg total) by mouth daily.  90 tablet  1  .  Lancets MISC Accucheck soft click-Use BID (Dx. 250.00)  100 each  0  . methotrexate (RHEUMATREX) 2.5 MG tablet Take 5 mg by mouth once a week. Take 2 tablets every Sunday      . potassium chloride (KLOR-CON 10) 10 MEQ tablet Take 1 tablet (10 mEq total) by mouth daily.  90 tablet  3  . ramipril (ALTACE) 10 MG capsule Take 1 capsule (10 mg total) by mouth daily.  90 capsule  1   No current facility-administered medications for this visit.    Review of Systems Review of Systems  Constitutional: Negative.   Respiratory: Negative.   Cardiovascular: Negative.     Blood pressure 140/78, pulse 76, resp. rate 12, height 5\' 3"  (1.6 m), weight 147 lb (66.679 kg).  Physical Exam Physical Exam  Constitutional: She is oriented to person, place, and time. She appears well-developed and well-nourished.  Eyes: No scleral icterus.  Cardiovascular: Normal rate, regular rhythm and normal heart sounds.   Pulmonary/Chest: Breath sounds normal.  Right lumpectomy looks clean and healing well.  Musculoskeletal:       Arms: Lymphadenopathy:  She has no cervical adenopathy.    She has no axillary adenopathy.  Neurological: She is alert and oriented to person, place, and time.  Skin: Skin is warm and dry.    Data Reviewed A 10 mm high-grade DCIS,margins negative by at least 7 mm. Sentinel lymph node negative. No invasive cancer identified.ER 90%, PR 70%. Sentinel lymph node biopsy negative. Ultrasound examination of the posterior biopsy cavity shows that this measures 0.7 x 3. 2 x 3.4 cm. The minimal distance from the cavity to the skin is 1.26 cm.  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     Plan    The role for post surgery radiation therapy was reviewed. The patient is amenable to meet with the radiation oncology service.     Patient to see Dr. Rushie Chestnut at the [facility] on Monday, 12-20-12 at 11 am.   Cynthia [name] 12/15/2012, 9:29 PM

## 2012-12-15 NOTE — Patient Instructions (Addendum)
Follow up appointment to be announced. Patient to talk with the radiation oncologist. We will arrange for patient to see Dr. Rushie Chestnut at the [facility].

## 2012-12-16 ENCOUNTER — Encounter: Payer: Self-pay | Admitting: General Surgery

## 2012-12-20 ENCOUNTER — Ambulatory Visit: Payer: Self-pay | Admitting: Radiation Oncology

## 2012-12-20 DIAGNOSIS — E785 Hyperlipidemia, unspecified: Secondary | ICD-10-CM | POA: Diagnosis not present

## 2012-12-20 DIAGNOSIS — Z170 Estrogen receptor positive status [ER+]: Secondary | ICD-10-CM | POA: Diagnosis not present

## 2012-12-20 DIAGNOSIS — I10 Essential (primary) hypertension: Secondary | ICD-10-CM | POA: Diagnosis not present

## 2012-12-20 DIAGNOSIS — D0590 Unspecified type of carcinoma in situ of unspecified breast: Secondary | ICD-10-CM | POA: Diagnosis not present

## 2012-12-20 DIAGNOSIS — Z79899 Other long term (current) drug therapy: Secondary | ICD-10-CM | POA: Diagnosis not present

## 2012-12-25 ENCOUNTER — Ambulatory Visit: Payer: Self-pay | Admitting: Radiation Oncology

## 2012-12-25 DIAGNOSIS — Z510 Encounter for antineoplastic radiation therapy: Secondary | ICD-10-CM | POA: Diagnosis not present

## 2012-12-25 DIAGNOSIS — D0590 Unspecified type of carcinoma in situ of unspecified breast: Secondary | ICD-10-CM | POA: Diagnosis not present

## 2012-12-25 DIAGNOSIS — Z170 Estrogen receptor positive status [ER+]: Secondary | ICD-10-CM | POA: Diagnosis not present

## 2012-12-28 ENCOUNTER — Other Ambulatory Visit: Payer: Self-pay | Admitting: Internal Medicine

## 2012-12-28 ENCOUNTER — Encounter: Payer: Self-pay | Admitting: Internal Medicine

## 2012-12-28 DIAGNOSIS — D0590 Unspecified type of carcinoma in situ of unspecified breast: Secondary | ICD-10-CM | POA: Diagnosis not present

## 2013-01-03 DIAGNOSIS — D0590 Unspecified type of carcinoma in situ of unspecified breast: Secondary | ICD-10-CM | POA: Diagnosis not present

## 2013-01-04 DIAGNOSIS — D0590 Unspecified type of carcinoma in situ of unspecified breast: Secondary | ICD-10-CM | POA: Diagnosis not present

## 2013-01-05 DIAGNOSIS — D0590 Unspecified type of carcinoma in situ of unspecified breast: Secondary | ICD-10-CM | POA: Diagnosis not present

## 2013-01-06 ENCOUNTER — Ambulatory Visit (INDEPENDENT_AMBULATORY_CARE_PROVIDER_SITE_OTHER): Payer: Medicare Other | Admitting: General Surgery

## 2013-01-06 ENCOUNTER — Encounter: Payer: Self-pay | Admitting: General Surgery

## 2013-01-06 VITALS — BP 154/82 | HR 60 | Resp 12 | Ht 63.0 in | Wt 147.0 lb

## 2013-01-06 DIAGNOSIS — Z85820 Personal history of malignant melanoma of skin: Secondary | ICD-10-CM | POA: Insufficient documentation

## 2013-01-06 DIAGNOSIS — C4360 Malignant melanoma of unspecified upper limb, including shoulder: Secondary | ICD-10-CM

## 2013-01-06 DIAGNOSIS — C4362 Malignant melanoma of left upper limb, including shoulder: Secondary | ICD-10-CM

## 2013-01-06 NOTE — Patient Instructions (Signed)
neosporin and band aid to left upper arm

## 2013-01-06 NOTE — Progress Notes (Signed)
Patient ID: Cynthia [name], female   DOB: [birth year], 79 y.o.   MRN: [id number]  Chief Complaint  Patient presents with  . Other    skin cancer left upper arm    HPI Cynthia [name] is a 79 y.o. female.  Here today for evaluation of left upper arm malignant melanoma diagnosed by Dr Orson Aloe 12-15-12. States the area "mole" had been there for years but had recently looked a little browner in color so he did a biopsy. HPI  Past Medical History  Diagnosis Date  . Diabetes mellitus   . Hypertension   . Hypercholesterolemia   . Myasthenia gravis     occular  . Allergic rhinitis   . Cancer 2014    right breast  . Cancer 12-15-12    left upper arm, malignant melanoma     Past Surgical History  Procedure Laterality Date  . Eye surgery      Caterac both eyes  . Breast biopsy Right 2014  . Breast surgery Right 2014    lumpectomy    Family History  Problem Relation Age of Onset  . Skin cancer Father   . Cancer Father     colon  . Colon cancer Brother   . Cancer Brother     colon  . Breast cancer      niece    Social History History  Substance Use Topics  . Smoking status: Never Smoker   . Smokeless tobacco: Never Used  . Alcohol Use: No    Allergies  Allergen Reactions  . No Known Drug Allergy     Current Outpatient Prescriptions  Medication Sig Dispense Refill  . aspirin 81 MG tablet Take 81 mg by mouth daily.      Marland Kitchen atorvastatin (LIPITOR) 20 MG tablet Take 1 tablet (20 mg total) by mouth daily. KEEP LAB APPT FOR FURTHER REFILLS  90 tablet  0  . Calcium Carbonate-Vitamin D (CALCIUM 600+D) 600-400 MG-UNIT per tablet Take 1 tablet by mouth 2 (two) times daily.      . folic acid (FOLVITE) 1 MG tablet Take 1 mg by mouth daily.      . hydrochlorothiazide (HYDRODIURIL) 25 MG tablet TAKE 1 TABLET BY MOUTH EVERY DAY  90 tablet  1  . Lancets MISC Accucheck soft click-Use BID (Dx. 250.00)  100 each  0  . methotrexate (RHEUMATREX) 2.5 MG tablet Take 5 mg by mouth once  a week. Take 2 tablets every Sunday      . potassium chloride (KLOR-CON 10) 10 MEQ tablet Take 1 tablet (10 mEq total) by mouth daily.  90 tablet  3  . ramipril (ALTACE) 10 MG capsule Take 1 capsule (10 mg total) by mouth daily.  90 capsule  1   No current facility-administered medications for this visit.    Review of Systems Review of Systems  Constitutional: Negative.   Respiratory: Negative.   Cardiovascular: Negative.     Blood pressure 154/82, pulse 60, resp. rate 12, height 5\' 3"  (1.6 m), weight 147 lb (66.679 kg).  Physical Exam Physical Exam  Constitutional: She is oriented to person, place, and time. She appears well-developed and well-nourished.  Cardiovascular: Regular rhythm.   Pulmonary/Chest: Effort normal.  Lymphadenopathy:    She has no axillary adenopathy.       Left: No supraclavicular adenopathy present.  Neurological: She is alert and oriented to person, place, and time.  Skin: Skin is warm and dry.  12 x 15 mm area of healing  on the posterior left upper arm biopsy site    Data Reviewed Pathology dated October 22 toe 2014 showed a superficial spreading malignant melanoma of the left inferior posterior upper arm measuring 0.35 mm in thickness. No ulceration. Deep margins and peripheral margins reported free but close. Low mitotic index.  Assessment    Superficial spreading malignant melanoma of the left upper extremity.     Plan    The indication for conservative local excision was reviewed. This will be completed as an office procedure in the near future.        Cynthia [name] 01/06/2013, 9:25 PM

## 2013-01-09 ENCOUNTER — Other Ambulatory Visit: Payer: Self-pay | Admitting: Internal Medicine

## 2013-01-11 DIAGNOSIS — D0590 Unspecified type of carcinoma in situ of unspecified breast: Secondary | ICD-10-CM | POA: Diagnosis not present

## 2013-01-12 DIAGNOSIS — H02409 Unspecified ptosis of unspecified eyelid: Secondary | ICD-10-CM | POA: Diagnosis not present

## 2013-01-12 LAB — CBC CANCER CENTER
Basophil #: 0 x10 3/mm (ref 0.0–0.1)
Basophil %: 0.6 %
Eosinophil %: 2 %
HCT: 37.8 % (ref 35.0–47.0)
MCH: 28.9 pg (ref 26.0–34.0)
MCHC: 32.2 g/dL (ref 32.0–36.0)
MCV: 90 fL (ref 80–100)
Monocyte %: 8.2 %
Platelet: 200 x10 3/mm (ref 150–440)
RBC: 4.22 10*6/uL (ref 3.80–5.20)
WBC: 5.9 x10 3/mm (ref 3.6–11.0)

## 2013-01-18 DIAGNOSIS — D0590 Unspecified type of carcinoma in situ of unspecified breast: Secondary | ICD-10-CM | POA: Diagnosis not present

## 2013-01-20 LAB — HM DIABETES EYE EXAM

## 2013-01-23 ENCOUNTER — Other Ambulatory Visit: Payer: Self-pay | Admitting: Internal Medicine

## 2013-01-24 ENCOUNTER — Ambulatory Visit: Payer: Self-pay | Admitting: Radiation Oncology

## 2013-01-24 DIAGNOSIS — D0590 Unspecified type of carcinoma in situ of unspecified breast: Secondary | ICD-10-CM | POA: Diagnosis not present

## 2013-01-24 DIAGNOSIS — Z170 Estrogen receptor positive status [ER+]: Secondary | ICD-10-CM | POA: Diagnosis not present

## 2013-01-24 DIAGNOSIS — Z510 Encounter for antineoplastic radiation therapy: Secondary | ICD-10-CM | POA: Diagnosis not present

## 2013-01-25 DIAGNOSIS — D0590 Unspecified type of carcinoma in situ of unspecified breast: Secondary | ICD-10-CM | POA: Diagnosis not present

## 2013-01-26 ENCOUNTER — Telehealth: Payer: Self-pay | Admitting: *Deleted

## 2013-01-26 LAB — CBC CANCER CENTER
Eosinophil #: 0.1 x10 3/mm (ref 0.0–0.7)
Eosinophil %: 2.9 %
HGB: 12.1 g/dL (ref 12.0–16.0)
MCH: 28.8 pg (ref 26.0–34.0)
MCV: 88 fL (ref 80–100)
Monocyte #: 0.4 x10 3/mm (ref 0.2–0.9)
Neutrophil #: 2.8 x10 3/mm (ref 1.4–6.5)
Neutrophil %: 59.1 %
Platelet: 184 x10 3/mm (ref 150–440)
RBC: 4.2 10*6/uL (ref 3.80–5.20)

## 2013-01-26 NOTE — Telephone Encounter (Signed)
LMTCB- Dr. Lorin Picket has reviewed her sugar readings: her blood sugars looks good (Records at [facility])

## 2013-01-26 NOTE — Telephone Encounter (Signed)
Pt notifed.

## 2013-01-31 ENCOUNTER — Encounter: Payer: Self-pay | Admitting: Internal Medicine

## 2013-02-01 ENCOUNTER — Encounter: Payer: Self-pay | Admitting: General Surgery

## 2013-02-01 ENCOUNTER — Ambulatory Visit (INDEPENDENT_AMBULATORY_CARE_PROVIDER_SITE_OTHER): Payer: Medicare Other | Admitting: General Surgery

## 2013-02-01 VITALS — BP 130/78 | HR 74 | Resp 14 | Ht 64.0 in | Wt 148.0 lb

## 2013-02-01 DIAGNOSIS — D0590 Unspecified type of carcinoma in situ of unspecified breast: Secondary | ICD-10-CM | POA: Diagnosis not present

## 2013-02-01 DIAGNOSIS — C4360 Malignant melanoma of unspecified upper limb, including shoulder: Secondary | ICD-10-CM

## 2013-02-01 NOTE — Progress Notes (Signed)
She is here today for excision of lesion left upper arm.   Previous biopsy completed by the dermatology service showed a 0.35 mm superficial spreading malignant melanoma of the left posterior distal upper arm. She presents today for wide excision.  The area was cleansed with alcohol and 20 cc of 0.5% Xylocaine with 0.25% Marcaine with 1-200,000 units of epinephrine was utilized a well-tolerated. Chlor prep was applied to the skin. An elliptical incision, longitudinally orientated with a 1 cm minimal distance from the previous biopsy site was undertaken. This was 3 cm wide, 6 cm long. This was extended down into the adipose tissue. Scant bleeding was encountered. The deep tissue was approximated with interrupted 2-0 Vicryl figure-of-eight sutures. A layer of deep dermal 3-0 Vicryl sutures were placed. Skin was approximated with a running 4-0 nylon simple suture. Telfa and Tegaderm dressing applied.  The patient tolerated the procedure very well. She has Norco left over from her previous breast surgery and she may use this if needed for pain, otherwise Tylenol/Advil/ Aleve as needed.  The Tegaderm dressing can remain in place as long as it is comfortable. A followup examination and suture removal take place with the staff in 10 days.    The patient did report some thickening at the wide excision site in the right breast. She is about halfway through her radiation. Examination showed no evidence of infection or induration. Observation is recommended.

## 2013-02-01 NOTE — Patient Instructions (Addendum)
Keep area dry, may leave dressing in place

## 2013-02-02 ENCOUNTER — Ambulatory Visit (INDEPENDENT_AMBULATORY_CARE_PROVIDER_SITE_OTHER): Payer: Medicare Other | Admitting: *Deleted

## 2013-02-02 DIAGNOSIS — D0590 Unspecified type of carcinoma in situ of unspecified breast: Secondary | ICD-10-CM | POA: Diagnosis not present

## 2013-02-02 DIAGNOSIS — C4360 Malignant melanoma of unspecified upper limb, including shoulder: Secondary | ICD-10-CM

## 2013-02-02 LAB — CBC CANCER CENTER
Basophil #: 0 x10 3/mm (ref 0.0–0.1)
Basophil %: 0.5 %
Eosinophil %: 1.9 %
MCH: 28.4 pg (ref 26.0–34.0)
MCHC: 31.9 g/dL — ABNORMAL LOW (ref 32.0–36.0)
Monocyte #: 0.5 x10 3/mm (ref 0.2–0.9)
Monocyte %: 9.8 %
Neutrophil #: 3.5 x10 3/mm (ref 1.4–6.5)
Neutrophil %: 64.5 %
Platelet: 179 x10 3/mm (ref 150–440)
RBC: 4.24 10*6/uL (ref 3.80–5.20)
RDW: 13.7 % (ref 11.5–14.5)

## 2013-02-02 NOTE — Patient Instructions (Signed)
Follow up as scheduled.  

## 2013-02-02 NOTE — Progress Notes (Signed)
Patient here requesting a dressing change.  States she noticed it was real bloody and wanted to make sure everything was "ok".  Dressing changed. Incision with sutures clean no active bleeding noted, no signs of infection noted.  Redressed, pt pleased.

## 2013-02-04 DIAGNOSIS — D0590 Unspecified type of carcinoma in situ of unspecified breast: Secondary | ICD-10-CM | POA: Diagnosis not present

## 2013-02-04 LAB — PATHOLOGY

## 2013-02-08 DIAGNOSIS — D0590 Unspecified type of carcinoma in situ of unspecified breast: Secondary | ICD-10-CM | POA: Diagnosis not present

## 2013-02-09 LAB — CBC CANCER CENTER
Basophil %: 0.3 %
Eosinophil #: 0.1 x10 3/mm (ref 0.0–0.7)
Eosinophil %: 2.4 %
HCT: 40.1 % (ref 35.0–47.0)
HGB: 12.8 g/dL (ref 12.0–16.0)
Lymphocyte %: 21.4 %
MCH: 28 pg (ref 26.0–34.0)
MCHC: 31.8 g/dL — ABNORMAL LOW (ref 32.0–36.0)
MCV: 88 fL (ref 80–100)
Monocyte %: 10.1 %
Neutrophil #: 3.9 x10 3/mm (ref 1.4–6.5)
Neutrophil %: 65.8 %
RBC: 4.56 10*6/uL (ref 3.80–5.20)
WBC: 5.9 x10 3/mm (ref 3.6–11.0)

## 2013-02-10 ENCOUNTER — Ambulatory Visit (INDEPENDENT_AMBULATORY_CARE_PROVIDER_SITE_OTHER): Payer: Medicare Other | Admitting: *Deleted

## 2013-02-10 DIAGNOSIS — C4360 Malignant melanoma of unspecified upper limb, including shoulder: Secondary | ICD-10-CM

## 2013-02-10 NOTE — Progress Notes (Signed)
Patient came in today for a wound check excision melanoma left arm.  The wound is clean, with no signs of infection noted. The sutures were removed and steri strips applied.

## 2013-02-14 ENCOUNTER — Telehealth: Payer: Self-pay | Admitting: Internal Medicine

## 2013-02-14 NOTE — Telephone Encounter (Signed)
See below

## 2013-02-14 NOTE — Telephone Encounter (Signed)
The patient thinks she has a UTI and she wants to be seen .

## 2013-02-14 NOTE — Telephone Encounter (Signed)
Pt unable to take appt today due to another appt at 3:15, scheduled for tomorrow at 8:15.

## 2013-02-14 NOTE — Telephone Encounter (Signed)
Please advise 

## 2013-02-14 NOTE — Telephone Encounter (Signed)
See if she can come in at 2:45 for evaluation - work in for uti

## 2013-02-15 ENCOUNTER — Ambulatory Visit (INDEPENDENT_AMBULATORY_CARE_PROVIDER_SITE_OTHER): Payer: Medicare Other | Admitting: Internal Medicine

## 2013-02-15 ENCOUNTER — Encounter: Payer: Self-pay | Admitting: Internal Medicine

## 2013-02-15 VITALS — BP 140/80 | HR 64 | Temp 98.5°F | Ht 63.0 in | Wt 147.8 lb

## 2013-02-15 DIAGNOSIS — D0590 Unspecified type of carcinoma in situ of unspecified breast: Secondary | ICD-10-CM | POA: Diagnosis not present

## 2013-02-15 DIAGNOSIS — N390 Urinary tract infection, site not specified: Secondary | ICD-10-CM

## 2013-02-15 DIAGNOSIS — C4360 Malignant melanoma of unspecified upper limb, including shoulder: Secondary | ICD-10-CM | POA: Diagnosis not present

## 2013-02-15 DIAGNOSIS — C4362 Malignant melanoma of left upper limb, including shoulder: Secondary | ICD-10-CM

## 2013-02-15 DIAGNOSIS — D0511 Intraductal carcinoma in situ of right breast: Secondary | ICD-10-CM

## 2013-02-15 LAB — POCT URINALYSIS DIPSTICK
Bilirubin, UA: NEGATIVE
Ketones, UA: NEGATIVE
Nitrite, UA: NEGATIVE
Protein, UA: NEGATIVE
Urobilinogen, UA: 0.2
pH, UA: 6

## 2013-02-15 MED ORDER — CIPROFLOXACIN HCL 250 MG PO TABS: 250.0000 mg | ORAL_TABLET | Freq: Two times a day (BID) | ORAL | Status: DC

## 2013-02-15 NOTE — Progress Notes (Signed)
Pre-visit discussion using our clinic review tool. No additional management support is needed unless otherwise documented below in the visit note.  

## 2013-02-16 LAB — CBC CANCER CENTER
Basophil #: 0 x10 3/mm (ref 0.0–0.1)
Basophil %: 0.4 %
HGB: 12.1 g/dL (ref 12.0–16.0)
Lymphocyte #: 0.8 x10 3/mm — ABNORMAL LOW (ref 1.0–3.6)
Lymphocyte %: 18.7 %
MCH: 28.4 pg (ref 26.0–34.0)
MCHC: 31.9 g/dL — ABNORMAL LOW (ref 32.0–36.0)
MCV: 89 fL (ref 80–100)
Monocyte #: 0.4 x10 3/mm (ref 0.2–0.9)
Monocyte %: 10.9 %
Neutrophil #: 2.8 x10 3/mm (ref 1.4–6.5)

## 2013-02-18 ENCOUNTER — Encounter: Payer: Self-pay | Admitting: Internal Medicine

## 2013-02-18 DIAGNOSIS — N390 Urinary tract infection, site not specified: Secondary | ICD-10-CM | POA: Insufficient documentation

## 2013-02-18 LAB — URINE CULTURE: Colony Count: >=100000

## 2013-02-18 NOTE — Progress Notes (Signed)
  Subjective:    Patient ID: Cynthia [name], female    DOB: 1931/10/23, 79 y.o.   MRN: [id number]  Urinary Tract Infection   79 year old female with past history of hypercholesterolemia, hypertension, diabetes, allergic rhinitis/sinusitis and occular Myasthenia Gravis (followed by Dr Sherryll Burger).  She comes in today as a work in with concerns regarding a urinary tract infection.  States symptoms started one week ago.  Dysuria.  No abdominal pain or hematuria.  Urinary odor.  No fever, nausea or vomiting.  Has been drinking cranberry juice and has taken cystex.  No bowel changes.    Past Medical History  Diagnosis Date  . Diabetes mellitus   . Hypertension   . Hypercholesterolemia   . Myasthenia gravis     occular  . Allergic rhinitis   . Cancer 2014    right breast  . Cancer 12-15-12    left upper arm, malignant melanoma     Outpatient Encounter Prescriptions as of 02/15/2013  Medication Sig  . aspirin 81 MG tablet Take 81 mg by mouth daily.  Marland Kitchen atorvastatin (LIPITOR) 20 MG tablet Take 1 tablet (20 mg total) by mouth daily.  . Calcium Carbonate-Vitamin D (CALCIUM 600+D) 600-400 MG-UNIT per tablet Take 1 tablet by mouth 2 (two) times daily.  . hydrochlorothiazide (HYDRODIURIL) 25 MG tablet TAKE 1 TABLET BY MOUTH EVERY DAY  . KLOR-CON 10 10 MEQ tablet TAKE 1 TABLET (10 MEQ TOTAL) BY MOUTH DAILY.  Marland Kitchen Lancets MISC Accucheck soft click-Use BID (Dx. 250.00)  . ramipril (ALTACE) 10 MG capsule Take 1 capsule (10 mg total) by mouth daily.  . ciprofloxacin (CIPRO) 250 MG tablet Take 1 tablet (250 mg total) by mouth 2 (two) times daily.  . [DISCONTINUED] folic acid (FOLVITE) 1 MG tablet Take 1 mg by mouth daily.  . [DISCONTINUED] methotrexate (RHEUMATREX) 2.5 MG tablet Take 5 mg by mouth once a week. Take 2 tablets every Sunday    Review of Systems No nausea or vomiting.  No abdominal pain or cramping.  No bowel change, such as diarrhea.  Some dysuria with urinary odor.  No abdominal pain.  No  hematuria.  No fever, nausea or vomiting.  No vaginal symptoms.         Objective:   Physical Exam  Filed Vitals:   02/15/13 0758  BP: 140/80  Pulse: 64  Temp: 98.5 F (73.21 C)   79 year old female in no acute distress.   HEART:  Appears to be regular. LUNGS:  Without crackles or wheezing audible.  Respirations even and unlabored.   ABDOMEN:  Soft, nontender.  BACK:  No CVA tenderness.  Non tender to palpation.                    Assessment & Plan:

## 2013-02-18 NOTE — Assessment & Plan Note (Signed)
S/p removal.  Continues to follow up with surgery and dermatology.   

## 2013-02-18 NOTE — Assessment & Plan Note (Signed)
Symptoms and urine dip appear to be consistent with uti.  Treat with cipro as directed.  Follow.  Fluids.  Explained to her if symptoms worsened or did not resolve, she was to be reevaluated.

## 2013-02-18 NOTE — Assessment & Plan Note (Signed)
Undergoing XRT.  Follow.

## 2013-02-21 ENCOUNTER — Telehealth: Payer: Self-pay | Admitting: Internal Medicine

## 2013-02-21 ENCOUNTER — Other Ambulatory Visit: Payer: Self-pay | Admitting: *Deleted

## 2013-02-21 MED ORDER — CIPROFLOXACIN HCL 250 MG PO TABS: 250.0000 mg | ORAL_TABLET | Freq: Two times a day (BID) | ORAL | Status: DC

## 2013-02-21 NOTE — Telephone Encounter (Signed)
Patient states she is still burning a little bit and wanted to know if she needed another prescription for cipro she has finished the first round of antibiotic.

## 2013-02-21 NOTE — Telephone Encounter (Signed)
Sent in a refill & left patient a voicemail

## 2013-02-21 NOTE — Telephone Encounter (Signed)
See lab results (Urine culture was positive-completed Cipro)-please advise

## 2013-02-21 NOTE — Telephone Encounter (Signed)
If better, but still just some residual symptoms - can refill cipro 250mg  bid x 5 more days.  If any worsening symptoms, needs reevaluation.

## 2013-02-22 DIAGNOSIS — D0590 Unspecified type of carcinoma in situ of unspecified breast: Secondary | ICD-10-CM | POA: Diagnosis not present

## 2013-02-24 ENCOUNTER — Ambulatory Visit: Payer: Self-pay | Admitting: Radiation Oncology

## 2013-02-24 DIAGNOSIS — Z923 Personal history of irradiation: Secondary | ICD-10-CM

## 2013-02-24 DIAGNOSIS — Z510 Encounter for antineoplastic radiation therapy: Secondary | ICD-10-CM | POA: Diagnosis not present

## 2013-02-24 DIAGNOSIS — D0590 Unspecified type of carcinoma in situ of unspecified breast: Secondary | ICD-10-CM | POA: Diagnosis not present

## 2013-02-24 HISTORY — DX: Personal history of irradiation: Z92.3

## 2013-03-02 ENCOUNTER — Telehealth: Payer: Self-pay | Admitting: General Surgery

## 2013-03-02 NOTE — Telephone Encounter (Signed)
PT CALLED TODAY REF NOTE I LEFT 03-01-13 ABOUT DR CHYSTAL WANTING PT TO BE ON TAMOXIFEN. THE PT WAS GOING TO CHECK ON WHICH PHARMACY SHE WAS GOING TO USE.SHE'S GOING TO USE [facility] F# [phone] AND CAN GET THEM IN 10 OR 20 MG.SHE WOULD LIKE TO BE CALLED ONCE THIS  HAS BEEN FAXED.

## 2013-03-02 NOTE — Telephone Encounter (Signed)
Will review tamoxifen RX at Feb appt.

## 2013-03-02 NOTE — Telephone Encounter (Signed)
Patient aware and agrees.

## 2013-03-21 ENCOUNTER — Telehealth: Payer: Self-pay | Admitting: Internal Medicine

## 2013-03-21 NOTE — Telephone Encounter (Signed)
And she will need refill on Ramipril 10 mg, Atorivastatin 20 mg and Hydrocholrithyzide 25 mg.

## 2013-03-21 NOTE — Telephone Encounter (Signed)
Pt has switched over to [facility] mailorder and will need to pick those rx's up for the first time. Pt is needing refills on Klor Con 10 mg 90 day supply. Please advise.

## 2013-03-22 ENCOUNTER — Other Ambulatory Visit: Payer: Self-pay | Admitting: *Deleted

## 2013-03-22 MED ORDER — ATORVASTATIN CALCIUM 20 MG PO TABS: 20.0000 mg | ORAL_TABLET | Freq: Every day | ORAL | Status: DC

## 2013-03-22 MED ORDER — POTASSIUM CHLORIDE ER 10 MEQ PO TBCR: EXTENDED_RELEASE_TABLET | ORAL | Status: DC

## 2013-03-22 MED ORDER — HYDROCHLOROTHIAZIDE 25 MG PO TABS: ORAL_TABLET | ORAL | Status: DC

## 2013-03-22 MED ORDER — RAMIPRIL 10 MG PO CAPS: 10.0000 mg | ORAL_CAPSULE | Freq: Every day | ORAL | Status: DC

## 2013-03-22 NOTE — Telephone Encounter (Signed)
Rx's sent to [facility]

## 2013-03-27 ENCOUNTER — Ambulatory Visit: Payer: Self-pay

## 2013-03-27 ENCOUNTER — Ambulatory Visit: Payer: Self-pay | Admitting: Radiation Oncology

## 2013-04-04 ENCOUNTER — Ambulatory Visit (INDEPENDENT_AMBULATORY_CARE_PROVIDER_SITE_OTHER): Payer: Medicare Other | Admitting: General Surgery

## 2013-04-04 ENCOUNTER — Encounter: Payer: Self-pay | Admitting: General Surgery

## 2013-04-04 VITALS — BP 130/90 | HR 80 | Resp 14 | Ht 64.0 in | Wt 147.0 lb

## 2013-04-04 DIAGNOSIS — C439 Malignant melanoma of skin, unspecified: Secondary | ICD-10-CM | POA: Diagnosis not present

## 2013-04-04 DIAGNOSIS — D0510 Intraductal carcinoma in situ of unspecified breast: Secondary | ICD-10-CM

## 2013-04-04 DIAGNOSIS — D0590 Unspecified type of carcinoma in situ of unspecified breast: Secondary | ICD-10-CM

## 2013-04-04 DIAGNOSIS — C50911 Malignant neoplasm of unspecified site of right female breast: Secondary | ICD-10-CM | POA: Insufficient documentation

## 2013-04-04 MED ORDER — TAMOXIFEN CITRATE 20 MG PO TABS: 20.0000 mg | ORAL_TABLET | Freq: Every day | ORAL | Status: DC

## 2013-04-04 NOTE — Patient Instructions (Addendum)
Patient to return in April 2015 with right diagnostic mammogram.  Patient advised to start Tamoxifen. Patient instructed to take medication for at least 1 month. She is aware of the side effects. Patient to call in 1 month with an update on how she is doing on Tamoxifen.

## 2013-04-04 NOTE — Progress Notes (Signed)
Patient ID: Cynthia [name], female   DOB: [birth year], 80 y.o.   MRN: [id number]  Chief Complaint  Patient presents with  . Follow-up    melanoma excision    HPI Cynthia [name] is a 80 y.o. female who presents for a follow up of an excision of a melanoma. The excision was performed on 02/01/13. She denies any new problems at this time.   The patient completed her whole breast radiation in January 2015. Except for some mild thickening along the wide excision site she has not appreciated any problems. No difficult or shoulder range of motion.   HPI  Past Medical History  Diagnosis Date  . Diabetes mellitus   . Hypertension   . Hypercholesterolemia   . Myasthenia gravis     occular  . Allergic rhinitis   . Cancer 2014    right breast  . Cancer 12-15-12    left upper arm, malignant melanoma     Past Surgical History  Procedure Laterality Date  . Eye surgery      Caterac both eyes  . Breast biopsy Right 2014  . Breast surgery Right 2014    lumpectomy    Family History  Problem Relation Age of Onset  . Skin cancer Father   . Cancer Father     colon  . Colon cancer Brother   . Cancer Brother     colon  . Breast cancer      niece    Social History History  Substance Use Topics  . Smoking status: Never Smoker   . Smokeless tobacco: Never Used  . Alcohol Use: No    Allergies  Allergen Reactions  . No Known Drug Allergy     Current Outpatient Prescriptions  Medication Sig Dispense Refill  . aspirin 81 MG tablet Take 81 mg by mouth daily.      Marland Kitchen atorvastatin (LIPITOR) 20 MG tablet Take 1 tablet (20 mg total) by mouth daily.  90 tablet  1  . Calcium Carbonate-Vitamin D (CALCIUM 600+D) 600-400 MG-UNIT per tablet Take 1 tablet by mouth 2 (two) times daily.      . hydrochlorothiazide (HYDRODIURIL) 25 MG tablet TAKE 1 TABLET BY MOUTH EVERY DAY  90 tablet  1  . Lancets MISC Accucheck soft click-Use BID (Dx. 595.63)  100 each  0  . potassium chloride (KLOR-CON 10)  10 MEQ tablet TAKE 1 TABLET (10 MEQ TOTAL) BY MOUTH DAILY.  90 tablet  2  . ramipril (ALTACE) 10 MG capsule Take 1 capsule (10 mg total) by mouth daily.  90 capsule  1  . tamoxifen (NOLVADEX) 20 MG tablet Take 1 tablet (20 mg total) by mouth daily.  90 tablet  3   No current facility-administered medications for this visit.    Review of Systems Review of Systems  Constitutional: Negative.   Respiratory: Negative.   Cardiovascular: Negative.     Blood pressure 130/90, pulse 80, resp. rate 14, height 5\' 4"  (1.626 m), weight 147 lb (66.679 kg).  Physical Exam Physical Exam  Constitutional: She is oriented to person, place, and time. She appears well-developed and well-nourished.  Pulmonary/Chest:    Lymphadenopathy:    She has no axillary adenopathy.       Left: No inguinal adenopathy present.  Neurological: She is alert and oriented to person, place, and time.  Skin: Skin is warm and dry.  LEft distal arm has a well healed scar.     Data Reviewed Pathology from the December  2014 reexcision of the left distal posterior lateral upper arm showed no residual melanoma. Original tumor 0.35 mm.  Assessment    Doing well status post excision left upper arm melanoma  Doing well status post radiation treatment for DCIS involving the right breast.   Plan    The indications for antiestrogen therapy to lower her risk of recurrent disease was reviewed. Possible side effects were discussed including 1) DVT/PE; 2) uterine cancer and 3) vasomotor symptoms. The need to make use of medication for at least one month to determine her tolerance was emphasized.     Patient to call in one month with progress report of how the Tamoxifen is doing.   This patient has been scheduled for a unilateral right breast diagnostic mammogram at [facility] for 06-07-13 at 1:20 pm. She is aware of date, time, and instructions. Patient will follow up in the office after mammogram is completed.    Robert Bellow 04/05/2013, 7:10 AM

## 2013-05-10 ENCOUNTER — Ambulatory Visit (INDEPENDENT_AMBULATORY_CARE_PROVIDER_SITE_OTHER): Payer: Medicare Other | Admitting: Internal Medicine

## 2013-05-10 ENCOUNTER — Encounter: Payer: Self-pay | Admitting: Internal Medicine

## 2013-05-10 VITALS — BP 140/78 | HR 74 | Temp 98.5°F | Ht 64.0 in

## 2013-05-10 DIAGNOSIS — E119 Type 2 diabetes mellitus without complications: Secondary | ICD-10-CM

## 2013-05-10 DIAGNOSIS — Z9109 Other allergy status, other than to drugs and biological substances: Secondary | ICD-10-CM

## 2013-05-10 DIAGNOSIS — C50919 Malignant neoplasm of unspecified site of unspecified female breast: Secondary | ICD-10-CM

## 2013-05-10 DIAGNOSIS — M949 Disorder of cartilage, unspecified: Secondary | ICD-10-CM

## 2013-05-10 DIAGNOSIS — I10 Essential (primary) hypertension: Secondary | ICD-10-CM | POA: Diagnosis not present

## 2013-05-10 DIAGNOSIS — E7800 Pure hypercholesterolemia, unspecified: Secondary | ICD-10-CM

## 2013-05-10 DIAGNOSIS — R5381 Other malaise: Secondary | ICD-10-CM

## 2013-05-10 DIAGNOSIS — M899 Disorder of bone, unspecified: Secondary | ICD-10-CM

## 2013-05-10 DIAGNOSIS — C4360 Malignant melanoma of unspecified upper limb, including shoulder: Secondary | ICD-10-CM

## 2013-05-10 DIAGNOSIS — G7000 Myasthenia gravis without (acute) exacerbation: Secondary | ICD-10-CM

## 2013-05-10 DIAGNOSIS — M8580 Other specified disorders of bone density and structure, unspecified site: Secondary | ICD-10-CM

## 2013-05-10 DIAGNOSIS — R5383 Other fatigue: Secondary | ICD-10-CM

## 2013-05-10 NOTE — Progress Notes (Signed)
Pre-visit discussion using our clinic review tool. No additional management support is needed unless otherwise documented below in the visit note.  

## 2013-05-10 NOTE — Progress Notes (Signed)
Subjective:    Patient ID: Cynthia [name], female    DOB: [birth year], 80 y.o.   MRN: [id number]  HPI 80 year old female with past history of hypercholesterolemia, hypertension, diabetes, allergic rhinitis/sinusitis and occular Myasthenia Gravis (followed by Dr Manuella Ghazi).  She also recently has been diagnosed with breast cancer.  Completed her XRT.   On tamoxifen.  She is also followed by Dr Bary Castilla for her malignant melanoma.  She comes in today for a scheduled follow.  States she is doing well.  Handling stress well.  Tolerating tamoxifen.  No chest pain or tightness.  Breathing stable.  Eating and drinking well.  No nausea or vomiting.  No bowel change.  Overall she feels she is doing well.    Past Medical History  Diagnosis Date  . Diabetes mellitus   . Hypertension   . Hypercholesterolemia   . Myasthenia gravis     occular  . Allergic rhinitis   . Cancer 2014    right breast  . Cancer 12-15-12    left upper arm, malignant melanoma     Current Outpatient Prescriptions on File Prior to Visit  Medication Sig Dispense Refill  . aspirin 81 MG tablet Take 81 mg by mouth daily.      Marland Kitchen atorvastatin (LIPITOR) 20 MG tablet Take 1 tablet (20 mg total) by mouth daily.  90 tablet  1  . Calcium Carbonate-Vitamin D (CALCIUM 600+D) 600-400 MG-UNIT per tablet Take 1 tablet by mouth 2 (two) times daily.      . hydrochlorothiazide (HYDRODIURIL) 25 MG tablet TAKE 1 TABLET BY MOUTH EVERY DAY  90 tablet  1  . Lancets MISC Accucheck soft click-Use BID (Dx. 884.16)  100 each  0  . potassium chloride (KLOR-CON 10) 10 MEQ tablet TAKE 1 TABLET (10 MEQ TOTAL) BY MOUTH DAILY.  90 tablet  2  . ramipril (ALTACE) 10 MG capsule Take 1 capsule (10 mg total) by mouth daily.  90 capsule  1  . tamoxifen (NOLVADEX) 20 MG tablet Take 1 tablet (20 mg total) by mouth daily.  90 tablet  3   No current facility-administered medications on file prior to visit.    Review of Systems Patient denies any headache,  lightheadedness or dizziness.  No sinus or allergy symptoms.  No chest pain, tightness or palpitations.  No increased shortness of breath, cough or congestion.  No nausea or vomiting.  No acid reflux.  No abdominal pain or cramping.  No bowel change, such as diarrhea, constipation, BRBPR or melana.  No urine change.  Handling stress well.  Sugars averaging 107-120s.         Objective:   Physical Exam Filed Vitals:   05/10/13 1417  BP: 140/78  Pulse: 74  Temp: 98.5 F (36.9 C)   Blood pressure recheck:  57/109  80 year old female in no acute distress.   HEENT:  Nares- clear.  Oropharynx - without lesions. NECK:  Supple.  Nontender.  No audible bruit.  HEART:  Appears to be regular. LUNGS:  No crackles or wheezing audible.  Respirations even and unlabored.  RADIAL PULSE:  Equal bilaterally.   ABDOMEN:  Soft, nontender.  Bowel sounds present and normal.  No audible abdominal bruit.   EXTREMITIES:  No increased edema present.  DP pulses palpable and equal bilaterally.      FEET:  No lesions.      Assessment & Plan:  HEALTH MAINTENANCE.  Physical 11/08/12.  Mammogram being followed by Dr  Byrnett.  Declines colonoscopy.  Last bone density improved.

## 2013-05-11 ENCOUNTER — Telehealth: Payer: Self-pay

## 2013-05-11 NOTE — Telephone Encounter (Signed)
Patient called to let us know she has been on the Tamoxifen for 30 days. She states that she has been doing well and has had no side effects from the medication.

## 2013-05-15 ENCOUNTER — Encounter: Payer: Self-pay | Admitting: Internal Medicine

## 2013-05-15 NOTE — Assessment & Plan Note (Signed)
Controlled.  

## 2013-05-15 NOTE — Assessment & Plan Note (Signed)
S/p removal.  Continues to follow up with surgery and dermatology.   

## 2013-05-15 NOTE — Assessment & Plan Note (Signed)
Follow met b and a1c.  Saw Dr Sydnor within the last 6 months.   Low carb diet.  Follow.    

## 2013-05-15 NOTE — Assessment & Plan Note (Signed)
Off Evista.  Continue calcium and vitamin D.  Last bone density 02/15/09 improved.

## 2013-05-15 NOTE — Assessment & Plan Note (Signed)
Finished XRT.  On tamoxifen.  Followed by Dr Byrnett.  

## 2013-05-15 NOTE — Assessment & Plan Note (Signed)
Blood pressure under good control.  Same meds.  Follow metabolic panel.

## 2013-05-15 NOTE — Assessment & Plan Note (Signed)
On Lipitor.  Low cholesterol diet and exercise.  Follow lipid panel and liver function.    

## 2013-05-15 NOTE — Assessment & Plan Note (Signed)
Has occular myasthenia gravis.  Seeing Dr Shah.  Doing well.   

## 2013-05-24 ENCOUNTER — Other Ambulatory Visit (INDEPENDENT_AMBULATORY_CARE_PROVIDER_SITE_OTHER): Payer: Medicare Other

## 2013-05-24 DIAGNOSIS — R5381 Other malaise: Secondary | ICD-10-CM | POA: Diagnosis not present

## 2013-05-24 DIAGNOSIS — E7800 Pure hypercholesterolemia, unspecified: Secondary | ICD-10-CM | POA: Diagnosis not present

## 2013-05-24 DIAGNOSIS — R5383 Other fatigue: Secondary | ICD-10-CM | POA: Diagnosis not present

## 2013-05-24 DIAGNOSIS — E119 Type 2 diabetes mellitus without complications: Secondary | ICD-10-CM | POA: Diagnosis not present

## 2013-05-24 DIAGNOSIS — G7000 Myasthenia gravis without (acute) exacerbation: Secondary | ICD-10-CM

## 2013-05-24 LAB — CBC WITH DIFFERENTIAL/PLATELET
Basophils Absolute: 0 10*3/uL (ref 0.0–0.1)
Basophils Relative: 0.4 % (ref 0.0–3.0)
EOS ABS: 0.1 10*3/uL (ref 0.0–0.7)
Eosinophils Relative: 1.4 % (ref 0.0–5.0)
HEMATOCRIT: 37.2 % (ref 36.0–46.0)
HEMOGLOBIN: 12.1 g/dL (ref 12.0–15.0)
LYMPHS ABS: 1.2 10*3/uL (ref 0.7–4.0)
Lymphocytes Relative: 18.4 % (ref 12.0–46.0)
MCHC: 32.4 g/dL (ref 30.0–36.0)
MCV: 86 fl (ref 78.0–100.0)
Monocytes Absolute: 0.4 10*3/uL (ref 0.1–1.0)
Monocytes Relative: 5.7 % (ref 3.0–12.0)
NEUTROS ABS: 5 10*3/uL (ref 1.4–7.7)
Neutrophils Relative %: 74.1 % (ref 43.0–77.0)
Platelets: 176 10*3/uL (ref 150.0–400.0)
RBC: 4.32 Mil/uL (ref 3.87–5.11)
RDW: 14.2 % (ref 11.5–14.6)
WBC: 6.8 10*3/uL (ref 4.5–10.5)

## 2013-05-24 LAB — HEPATIC FUNCTION PANEL
ALBUMIN: 3.6 g/dL (ref 3.5–5.2)
ALT: 16 U/L (ref 0–35)
AST: 20 U/L (ref 0–37)
Alkaline Phosphatase: 53 U/L (ref 39–117)
Bilirubin, Direct: 0.1 mg/dL (ref 0.0–0.3)
TOTAL PROTEIN: 6.7 g/dL (ref 6.0–8.3)
Total Bilirubin: 0.8 mg/dL (ref 0.3–1.2)

## 2013-05-24 LAB — BASIC METABOLIC PANEL
BUN: 16 mg/dL (ref 6–23)
CO2: 28 mEq/L (ref 19–32)
CREATININE: 1.1 mg/dL (ref 0.4–1.2)
Calcium: 9.9 mg/dL (ref 8.4–10.5)
Chloride: 103 mEq/L (ref 96–112)
GFR: 51.94 mL/min — AB (ref >60.00)
Glucose, Bld: 115 mg/dL — ABNORMAL HIGH (ref 70–99)
Potassium: 3.9 mEq/L (ref 3.5–5.1)
Sodium: 139 mEq/L (ref 135–145)

## 2013-05-24 LAB — HEMOGLOBIN A1C: HEMOGLOBIN A1C: 7.3 % — AB (ref 4.6–6.5)

## 2013-05-24 LAB — LIPID PANEL
Cholesterol: 149 mg/dL (ref 0–200)
HDL: 40.5 mg/dL (ref >39.00)
LDL CALC: 84 mg/dL (ref 0–99)
TRIGLYCERIDES: 121 mg/dL (ref 0.0–149.0)
Total CHOL/HDL Ratio: 4
VLDL: 24.2 mg/dL (ref 0.0–40.0)

## 2013-05-24 LAB — MICROALBUMIN / CREATININE URINE RATIO
Creatinine,U: 310.1 mg/dL
Microalb Creat Ratio: 0.6 mg/g (ref 0.0–30.0)
Microalb, Ur: 1.8 mg/dL (ref 0.0–1.9)

## 2013-05-24 LAB — TSH: TSH: 0.81 u[IU]/mL (ref 0.35–5.50)

## 2013-05-25 ENCOUNTER — Encounter: Payer: Self-pay | Admitting: *Deleted

## 2013-06-07 ENCOUNTER — Ambulatory Visit: Payer: Self-pay | Admitting: General Surgery

## 2013-06-07 ENCOUNTER — Encounter: Payer: Self-pay | Admitting: General Surgery

## 2013-06-07 DIAGNOSIS — Z853 Personal history of malignant neoplasm of breast: Secondary | ICD-10-CM | POA: Diagnosis not present

## 2013-06-07 DIAGNOSIS — R922 Inconclusive mammogram: Secondary | ICD-10-CM | POA: Diagnosis not present

## 2013-06-15 ENCOUNTER — Encounter: Payer: Self-pay | Admitting: General Surgery

## 2013-06-15 ENCOUNTER — Ambulatory Visit (INDEPENDENT_AMBULATORY_CARE_PROVIDER_SITE_OTHER): Payer: Medicare Other | Admitting: General Surgery

## 2013-06-15 VITALS — BP 142/78 | HR 72 | Resp 12 | Ht 64.0 in | Wt 148.0 lb

## 2013-06-15 DIAGNOSIS — D0590 Unspecified type of carcinoma in situ of unspecified breast: Secondary | ICD-10-CM | POA: Diagnosis not present

## 2013-06-15 DIAGNOSIS — D0510 Intraductal carcinoma in situ of unspecified breast: Secondary | ICD-10-CM

## 2013-06-15 NOTE — Patient Instructions (Signed)
Patient to return in 6 months bilateral diagnotic mammogram 

## 2013-06-15 NOTE — Progress Notes (Signed)
Patient ID: Cynthia [name], female   DOB: Sep 13, 1931, 80 y.o.   MRN: [id number]  Chief Complaint  Patient presents with  . Follow-up    mammogram    HPI Cynthia [name] is a 80 y.o. female who presents for a breast evaluation. The most recent mammogram was done on 06/07/13.Patient does perform regular self breast checks and gets regular mammograms done.Patient states her left foot feels numb, been going on for three weeks.   HPI  Past Medical History  Diagnosis Date  . Diabetes mellitus   . Hypertension   . Hypercholesterolemia   . Myasthenia gravis     occular  . Allergic rhinitis   . 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 Resected margins negative. Whole breast radiation  . Cancer 12-15-12    left upper arm, malignant melanoma     Past Surgical History  Procedure Laterality Date  . Eye surgery      Caterac both eyes  . Breast biopsy Right 2014  . Breast surgery Right 2014    lumpectomy    Family History  Problem Relation Age of Onset  . Skin cancer Father   . Cancer Father     colon  . Colon cancer Brother   . Cancer Brother     colon  . Breast cancer      niece    Social History History  Substance Use Topics  . Smoking status: Never Smoker   . Smokeless tobacco: Never Used  . Alcohol Use: No    Allergies  Allergen Reactions  . No Known Drug Allergy     Current Outpatient Prescriptions  Medication Sig Dispense Refill  . aspirin 81 MG tablet Take 81 mg by mouth daily.      Marland Kitchen atorvastatin (LIPITOR) 20 MG tablet Take 1 tablet (20 mg total) by mouth daily.  90 tablet  1  . Calcium Carbonate-Vitamin D (CALCIUM 600+D) 600-400 MG-UNIT per tablet Take 1 tablet by mouth 2 (two) times daily.      . hydrochlorothiazide (HYDRODIURIL) 25 MG tablet TAKE 1 TABLET BY MOUTH EVERY DAY  90 tablet  1  . Lancets MISC Accucheck soft click-Use BID (Dx. 132.44)  100 each  0  . potassium chloride (KLOR-CON 10) 10 MEQ tablet TAKE 1 TABLET (10 MEQ TOTAL) BY  MOUTH DAILY.  90 tablet  2  . ramipril (ALTACE) 10 MG capsule Take 1 capsule (10 mg total) by mouth daily.  90 capsule  1  . tamoxifen (NOLVADEX) 20 MG tablet Take 1 tablet (20 mg total) by mouth daily.  90 tablet  3   No current facility-administered medications for this visit.    Review of Systems Review of Systems  Constitutional: Negative.   Respiratory: Negative.   Cardiovascular: Negative.   Neurological: Positive for numbness (3 week history numbness of the left foot, no focal area. No difficulty with ambulation.).    Blood pressure 142/78, pulse 72, resp. rate 12, height 5\' 4"  (1.626 m), weight 148 lb (67.132 kg).  Physical Exam Physical Exam  Constitutional: She is oriented to person, place, and time. She appears well-developed and well-nourished.  Eyes: Conjunctivae are normal.  Neck: Neck supple.  Cardiovascular: Normal rate, regular rhythm, intact distal pulses and normal pulses.   Murmur ( stable ) heard. Pulses:      Dorsalis pedis pulses are 2+ on the left side.       Posterior tibial pulses are 2+ on the  left side.  No swelling. Various veins on left medial calf.  Normal warmth to the skin of the left foot. No edema. No ischemic changes.  Pulmonary/Chest: Effort normal and breath sounds normal.  Little firmness at biopsy site right breast .   Lymphadenopathy:    She has no cervical adenopathy.    She has no axillary adenopathy.  Neurological: She is alert and oriented to person, place, and time.  Skin: Skin is warm and dry.   Right breast mammogram dated 06/07/2013 was reviewed. Postbiopsy changes were identified. No areas suspicious for malignancy. BI-RAD-2.   Assessment    High-grade DCIS, status post whole breast radiation. Good tolerance of tamoxifen.  Right foot numbness without clear neurologic basis with no dermatomal pattern.    Plan    We'll plan for a followup with bilateral diagnostic mammograms in 6 months.    PCP: Arlyss Queen Mcihael Hinderman 06/16/2013, 1:55 PM

## 2013-06-16 ENCOUNTER — Encounter: Payer: Self-pay | Admitting: General Surgery

## 2013-06-16 DIAGNOSIS — G7000 Myasthenia gravis without (acute) exacerbation: Secondary | ICD-10-CM | POA: Diagnosis not present

## 2013-07-27 ENCOUNTER — Other Ambulatory Visit: Payer: Self-pay | Admitting: *Deleted

## 2013-07-27 MED ORDER — GLUCOSE BLOOD VI STRP: ORAL_STRIP | Status: DC

## 2013-08-12 ENCOUNTER — Other Ambulatory Visit: Payer: Self-pay | Admitting: *Deleted

## 2013-08-12 MED ORDER — HYDROCHLOROTHIAZIDE 25 MG PO TABS: ORAL_TABLET | ORAL | Status: DC

## 2013-08-12 MED ORDER — ATORVASTATIN CALCIUM 20 MG PO TABS
20.0000 mg | ORAL_TABLET | Freq: Every day | ORAL | Status: DC
Start: 2013-08-12 — End: 2014-03-13

## 2013-08-31 ENCOUNTER — Ambulatory Visit: Payer: Self-pay | Admitting: Radiation Oncology

## 2013-10-10 ENCOUNTER — Other Ambulatory Visit: Payer: Self-pay | Admitting: *Deleted

## 2013-10-10 MED ORDER — RAMIPRIL 10 MG PO CAPS: 10.0000 mg | ORAL_CAPSULE | Freq: Every day | ORAL | Status: DC

## 2013-10-10 NOTE — Addendum Note (Signed)
Addended by: Wynonia Lawman E on: 10/10/2013 01:53 PM   Modules accepted: Orders

## 2013-11-15 ENCOUNTER — Ambulatory Visit (INDEPENDENT_AMBULATORY_CARE_PROVIDER_SITE_OTHER): Payer: Medicare Other | Admitting: Internal Medicine

## 2013-11-15 ENCOUNTER — Encounter: Payer: Self-pay | Admitting: Internal Medicine

## 2013-11-15 VITALS — BP 110/70 | HR 60 | Temp 98.3°F | Resp 18 | Ht 63.0 in | Wt 147.0 lb

## 2013-11-15 DIAGNOSIS — M8580 Other specified disorders of bone density and structure, unspecified site: Secondary | ICD-10-CM

## 2013-11-15 DIAGNOSIS — Z9109 Other allergy status, other than to drugs and biological substances: Secondary | ICD-10-CM

## 2013-11-15 DIAGNOSIS — E119 Type 2 diabetes mellitus without complications: Secondary | ICD-10-CM

## 2013-11-15 DIAGNOSIS — C4360 Malignant melanoma of unspecified upper limb, including shoulder: Secondary | ICD-10-CM

## 2013-11-15 DIAGNOSIS — C4362 Malignant melanoma of left upper limb, including shoulder: Secondary | ICD-10-CM

## 2013-11-15 DIAGNOSIS — E7800 Pure hypercholesterolemia, unspecified: Secondary | ICD-10-CM | POA: Diagnosis not present

## 2013-11-15 DIAGNOSIS — I10 Essential (primary) hypertension: Secondary | ICD-10-CM

## 2013-11-15 DIAGNOSIS — M899 Disorder of bone, unspecified: Secondary | ICD-10-CM

## 2013-11-15 DIAGNOSIS — D0590 Unspecified type of carcinoma in situ of unspecified breast: Secondary | ICD-10-CM

## 2013-11-15 DIAGNOSIS — G7000 Myasthenia gravis without (acute) exacerbation: Secondary | ICD-10-CM

## 2013-11-15 DIAGNOSIS — M949 Disorder of cartilage, unspecified: Secondary | ICD-10-CM

## 2013-11-15 DIAGNOSIS — D0511 Intraductal carcinoma in situ of right breast: Secondary | ICD-10-CM

## 2013-11-15 LAB — HM DIABETES FOOT EXAM

## 2013-11-15 LAB — HM COLONOSCOPY

## 2013-11-15 NOTE — Progress Notes (Signed)
Pre visit review using our clinic review tool, if applicable. No additional management support is needed unless otherwise documented below in the visit note. 

## 2013-11-20 ENCOUNTER — Encounter: Payer: Self-pay | Admitting: Internal Medicine

## 2013-11-20 NOTE — Assessment & Plan Note (Signed)
Controlled.  

## 2013-11-20 NOTE — Assessment & Plan Note (Signed)
Follow met b and a1c.  Saw Dr Dawna Part within the last 6 months.   Low carb diet.  Follow.

## 2013-11-20 NOTE — Assessment & Plan Note (Signed)
Has occular myasthenia gravis.  Seeing Dr Manuella Ghazi.  Doing well.

## 2013-11-20 NOTE — Assessment & Plan Note (Signed)
Finished XRT.  On tamoxifen.  Followed by Dr Bary Castilla.  Scheduled for f/u mammogram 11/28/13.

## 2013-11-20 NOTE — Progress Notes (Signed)
Subjective:    Patient ID: Cynthia [name], female    DOB: 11-11-1931, 80 y.o.   MRN: [id number]  HPI 80 year old female with past history of hypercholesterolemia, hypertension, diabetes, allergic rhinitis/sinusitis and occular Myasthenia Gravis (followed by Dr Manuella Ghazi).  She also recently has been diagnosed with breast cancer.  Completed her XRT.   On tamoxifen.  She is also followed by Dr Bary Castilla for her malignant melanoma.  She comes in today to follow up on these issues as well as for a complete physical exam.  States she is doing well.  Handling stress well.  Tolerating tamoxifen.  No chest pain or tightness.  Breathing stable.  Eating and drinking well.  No nausea or vomiting.  No bowel change.  Overall she feels she is doing well.  Has f/u mammogram scheduled for 11/28/13.  Due to see Dr Dawna Part 11/15.     Past Medical History  Diagnosis Date  . Diabetes mellitus   . Hypertension   . Hypercholesterolemia   . Myasthenia gravis     occular  . Allergic rhinitis   . 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 Resected margins negative. Whole breast radiation  . Cancer 12-15-12    left upper arm, malignant melanoma     Current Outpatient Prescriptions on File Prior to Visit  Medication Sig Dispense Refill  . aspirin 81 MG tablet Take 81 mg by mouth daily.      Marland Kitchen atorvastatin (LIPITOR) 20 MG tablet Take 1 tablet (20 mg total) by mouth daily.  90 tablet  1  . Calcium Carbonate-Vitamin D (CALCIUM 600+D) 600-400 MG-UNIT per tablet Take 1 tablet by mouth 2 (two) times daily.      Marland Kitchen glucose blood (ACCU-CHEK AVIVA) test strip Use to check blood sugar twice a day. Dx. 250.00  200 each  3  . hydrochlorothiazide (HYDRODIURIL) 25 MG tablet TAKE 1 TABLET BY MOUTH EVERY DAY  90 tablet  1  . Lancets MISC Accucheck soft click-Use BID (Dx. 414.23)  100 each  0  . potassium chloride (KLOR-CON 10) 10 MEQ tablet TAKE 1 TABLET (10 MEQ TOTAL) BY MOUTH DAILY.  90 tablet  2  . ramipril (ALTACE) 10  MG capsule Take 1 capsule (10 mg total) by mouth daily.  90 capsule  1  . tamoxifen (NOLVADEX) 20 MG tablet Take 1 tablet (20 mg total) by mouth daily.  90 tablet  3   No current facility-administered medications on file prior to visit.    Review of Systems Patient denies any headache, lightheadedness or dizziness.  No sinus or allergy symptoms.  No chest pain, tightness or palpitations.  No increased shortness of breath, cough or congestion.  No nausea or vomiting.  No acid reflux.  No abdominal pain or cramping.  No bowel change, such as diarrhea, constipation, BRBPR or melana.  No urine change.  Handling stress well.  Sugars averaging 106-110.           Objective:   Physical Exam  Filed Vitals:   11/15/13 1412  BP: 110/70  Pulse: 60  Temp: 98.3 F (36.8 C)  Resp: 56   80 year old female in no acute distress.   HEENT:  Nares- clear.  Oropharynx - without lesions. NECK:  Supple.  Nontender.  No audible bruit.  HEART:  Appears to be regular. LUNGS:  No crackles or wheezing audible.  Respirations even and unlabored.  RADIAL PULSE:  Equal bilaterally.  BREASTS:  No nipple discharge or nipple retraction present.  Could not appreciate any distinct nodules or axillary adenopathy.  ABDOMEN:  Soft, nontender.  Bowel sounds present and normal.  No audible abdominal bruit.  GU:  Not performed.    EXTREMITIES:  No increased edema present.  DP pulses palpable and equal bilaterally.      FEET:  No lesions.        Assessment & Plan:  HEALTH MAINTENANCE.  Physical today.  Mammogram being followed by Dr Bary Castilla.  Scheduled for 11/28/13.  Declines colonoscopy.  Last bone density improved.

## 2013-11-20 NOTE — Assessment & Plan Note (Signed)
Blood pressure under good control.  Same meds.  Follow metabolic panel.

## 2013-11-20 NOTE — Assessment & Plan Note (Signed)
On Lipitor.  Low cholesterol diet and exercise.  Follow lipid panel and liver function.

## 2013-11-20 NOTE — Assessment & Plan Note (Signed)
Off Evista.  Continue calcium and vitamin D.  Last bone density 02/15/09 improved.  Needs f/u bone density.

## 2013-11-20 NOTE — Assessment & Plan Note (Signed)
S/p removal.  Continues to follow up with surgery and dermatology.

## 2013-11-23 ENCOUNTER — Ambulatory Visit (INDEPENDENT_AMBULATORY_CARE_PROVIDER_SITE_OTHER): Payer: Medicare Other | Admitting: *Deleted

## 2013-11-23 ENCOUNTER — Other Ambulatory Visit (INDEPENDENT_AMBULATORY_CARE_PROVIDER_SITE_OTHER): Payer: Medicare Other

## 2013-11-23 DIAGNOSIS — E7800 Pure hypercholesterolemia, unspecified: Secondary | ICD-10-CM | POA: Diagnosis not present

## 2013-11-23 DIAGNOSIS — E119 Type 2 diabetes mellitus without complications: Secondary | ICD-10-CM | POA: Diagnosis not present

## 2013-11-23 DIAGNOSIS — Z23 Encounter for immunization: Secondary | ICD-10-CM

## 2013-11-23 LAB — LIPID PANEL
Cholesterol: 142 mg/dL (ref 0–200)
HDL: 35.6 mg/dL — AB (ref >39.00)
LDL Cholesterol: 82 mg/dL (ref 0–99)
NonHDL: 106.4
Total CHOL/HDL Ratio: 4
Triglycerides: 124 mg/dL (ref 0.0–149.0)
VLDL: 24.8 mg/dL (ref 0.0–40.0)

## 2013-11-23 LAB — BASIC METABOLIC PANEL
BUN: 16 mg/dL (ref 6–23)
CALCIUM: 9.3 mg/dL (ref 8.4–10.5)
CO2: 28 meq/L (ref 19–32)
CREATININE: 1.1 mg/dL (ref 0.4–1.2)
Chloride: 106 mEq/L (ref 96–112)
GFR: 53.01 mL/min — ABNORMAL LOW (ref >60.00)
Glucose, Bld: 134 mg/dL — ABNORMAL HIGH (ref 70–99)
Potassium: 4 mEq/L (ref 3.5–5.1)
Sodium: 139 mEq/L (ref 135–145)

## 2013-11-23 LAB — HEPATIC FUNCTION PANEL
ALT: 16 U/L (ref 0–35)
AST: 19 U/L (ref 0–37)
Albumin: 3.5 g/dL (ref 3.5–5.2)
Alkaline Phosphatase: 48 U/L (ref 39–117)
BILIRUBIN DIRECT: 0.1 mg/dL (ref 0.0–0.3)
Total Bilirubin: 0.5 mg/dL (ref 0.2–1.2)
Total Protein: 6.7 g/dL (ref 6.0–8.3)

## 2013-11-23 LAB — HEMOGLOBIN A1C: Hgb A1c MFr Bld: 7.4 % — ABNORMAL HIGH (ref 4.6–6.5)

## 2013-11-24 ENCOUNTER — Encounter: Payer: Self-pay | Admitting: *Deleted

## 2013-11-26 IMAGING — MG MM BREAST SURGICAL SPECIMEN
1 series · 1 of 1 positions shown · non-contrast
Comparison: Previous exams.

REASON FOR EXAM: Rt needle placement
COMMENTS:

PROCEDURE:     MAM - MAM BREAST SPECIMEN  - December 08, 2012  [DATE]
CLINICAL DATA: Right breast malignancy diagnosis following
stereotactic biopsy of suspicious calcifications in the right
breast. .
EXAM:
BREAST NEEDLE LOCALIZATION*R*; BREAST SURGICAL SPECIMEN

[R CC]
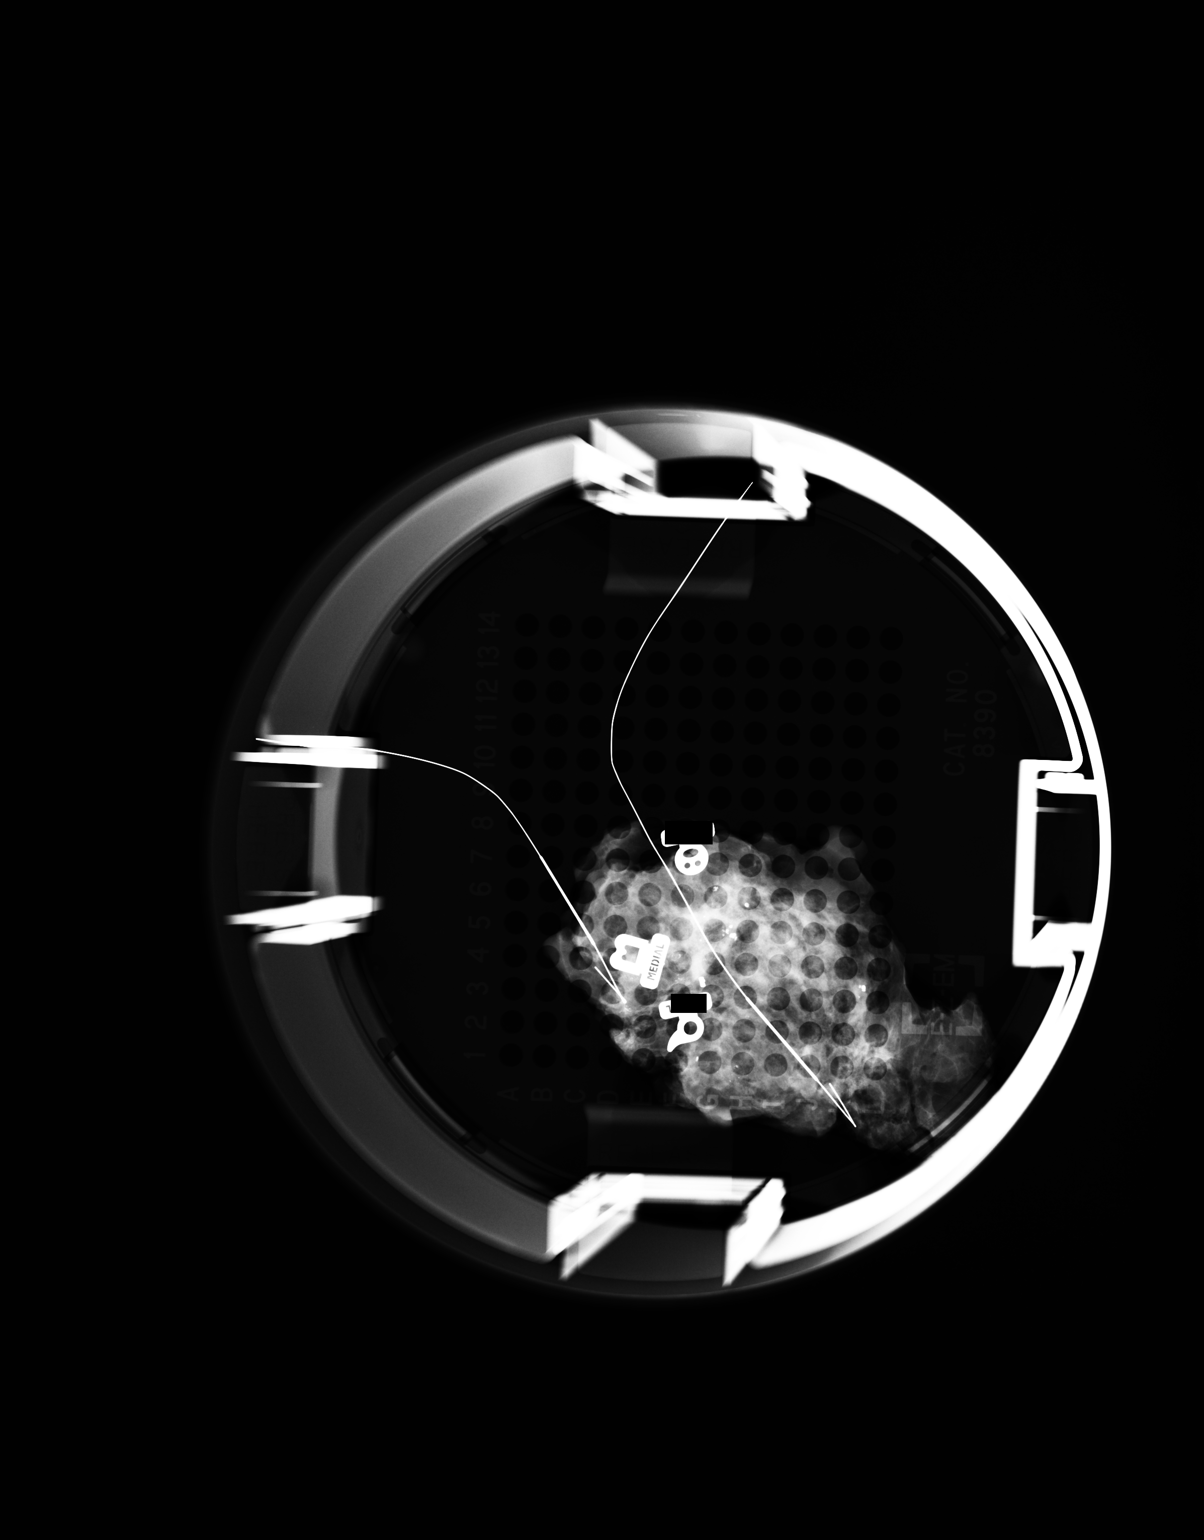

[1 of 1 positions shown; findings below may reference images not displayed]

FINDINGS: Patient presents for needle localization prior to bracketed 2 wire
localization of an area of calcifications. I met with the patient
and we discussed the procedure of needle localization including
benefits and alternatives. We discussed the high likelihood of a
successful procedure. We discussed the risks of the procedure,
including infection, bleeding, tissue injury, and further surgery.
Informed, written consent was given. The usual time-out protocol was
performed immediately prior to the procedure.

Using mammographic guidance, sterile technique, 2% lidocaine and two
7 cm modified Kopans needles, an area of residual calcifications and
biopsy clip or localized using superior to inferior approach. The
films were marked for Dr. Beam.

Specimen radiograph was performed in the [HOSPITAL], and
confirms calcifications, a biopsy clip, and two intact wires present
in the tissue sample. The specimen was marked for pathology.
IMPRESSION: Needle localization (bracketing with 2 wires) of the right breast.
No apparent complications.

## 2013-11-26 IMAGING — MG MM BREAST NEEDLE LOCALIZATION*R*
3 series · 8 of 8 positions shown · non-contrast
Comparison: Previous exams.

REASON FOR EXAM: NL [id number] [id number] BRACKETING
COMMENTS:

PROCEDURE:     MAM - MAM BREAST NEEDLE LOCAL RT  - December 08, 2012 [DATE]
CLINICAL DATA: Right breast malignancy diagnosis following
stereotactic biopsy of suspicious calcifications in the right
breast. .
EXAM:
BREAST NEEDLE LOCALIZATION*R*; BREAST SURGICAL SPECIMEN

[R CC · right · 6 of 8 slices shown (1 of 2)]
[im 1/8]
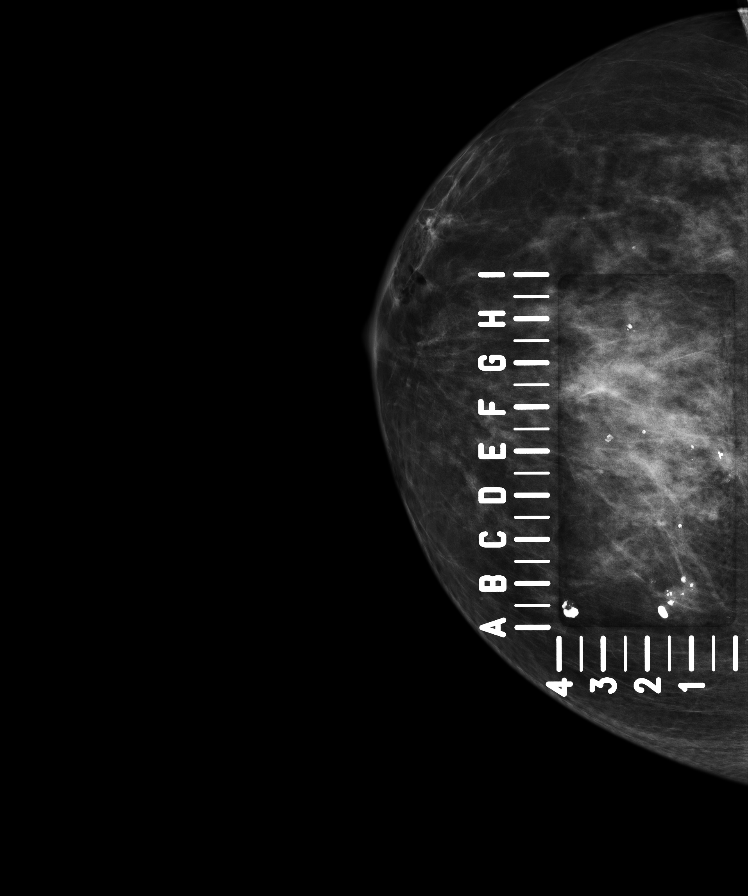
[im 2/8]
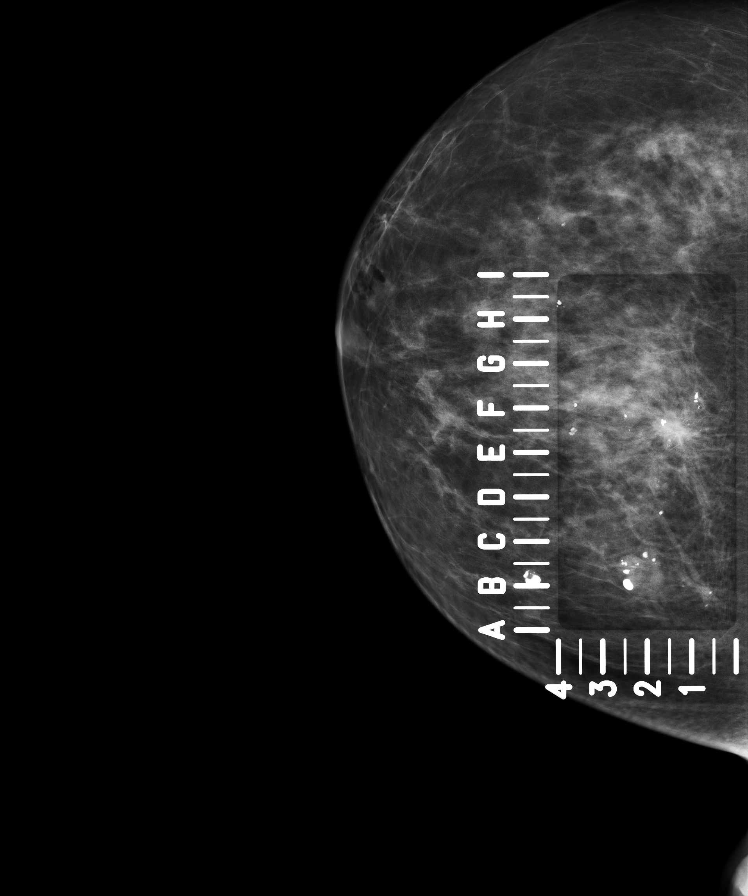
[im 3/8]
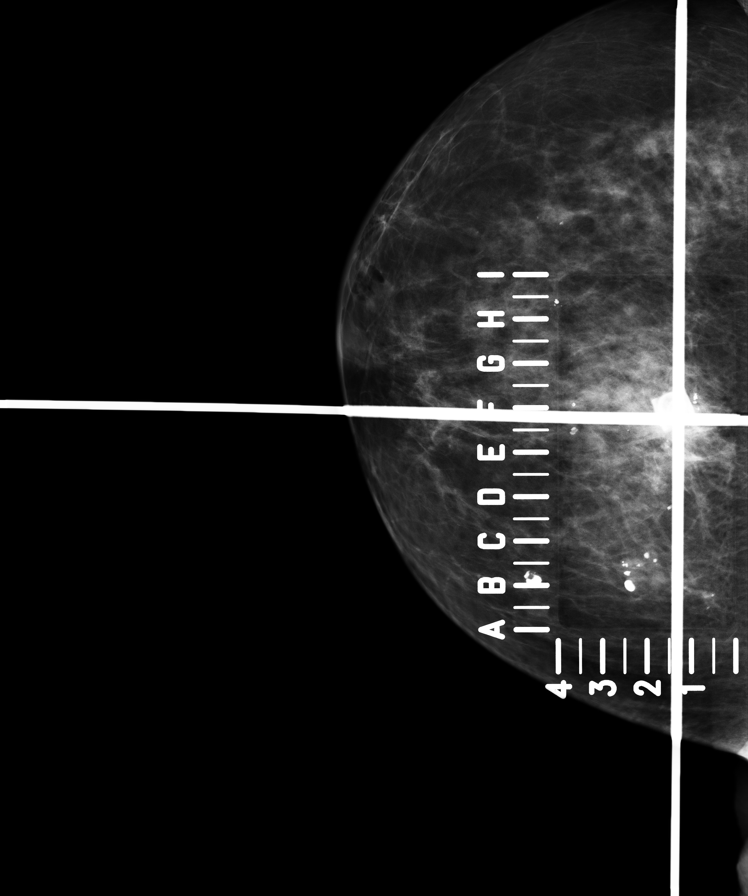
[im 5/8]
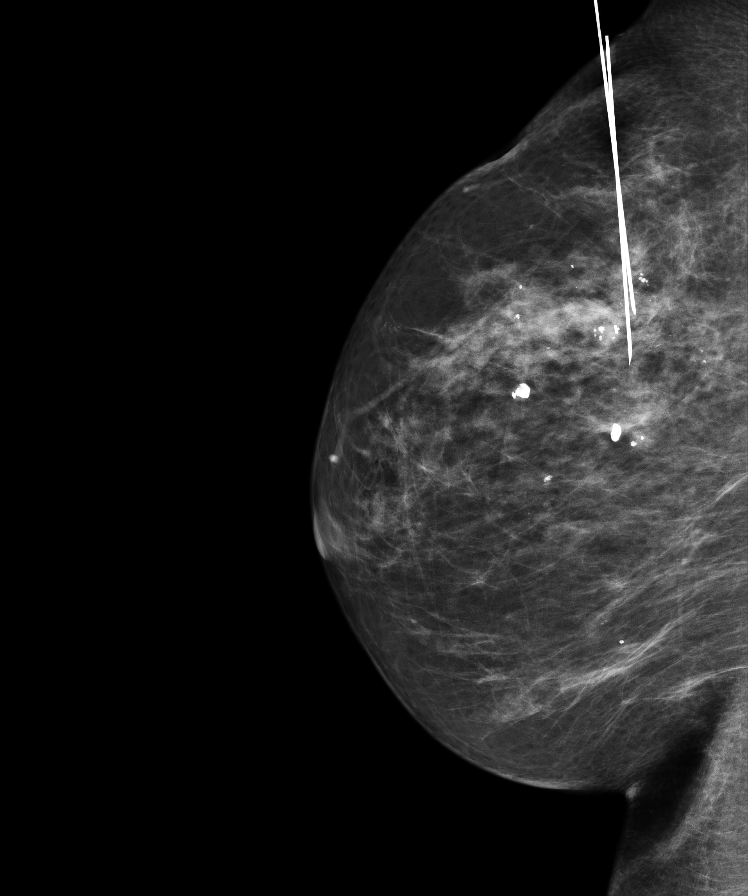
[im 6/8]
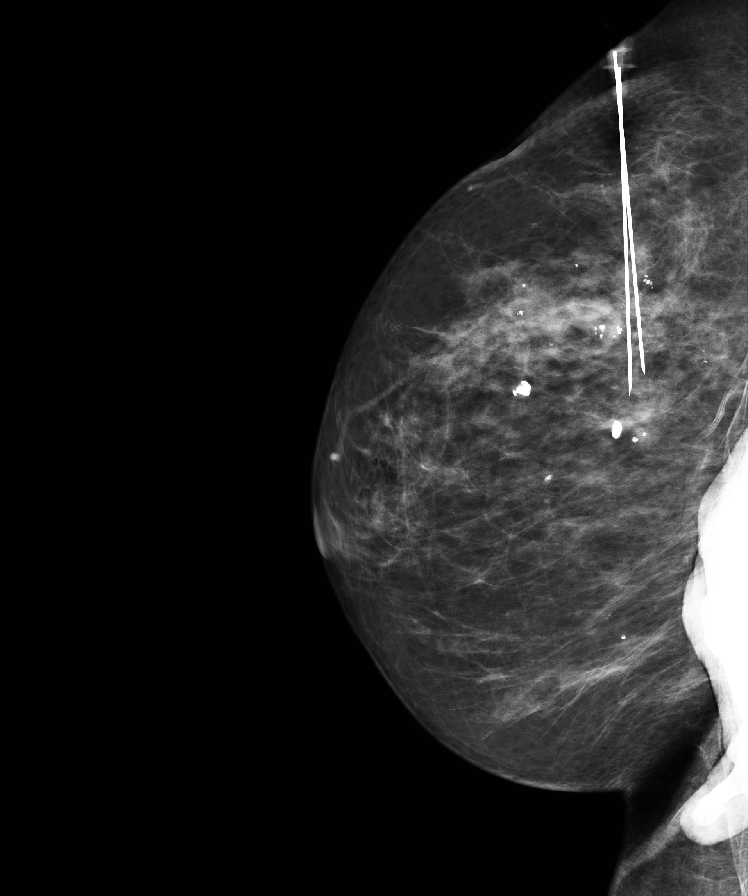
[im 8/8]
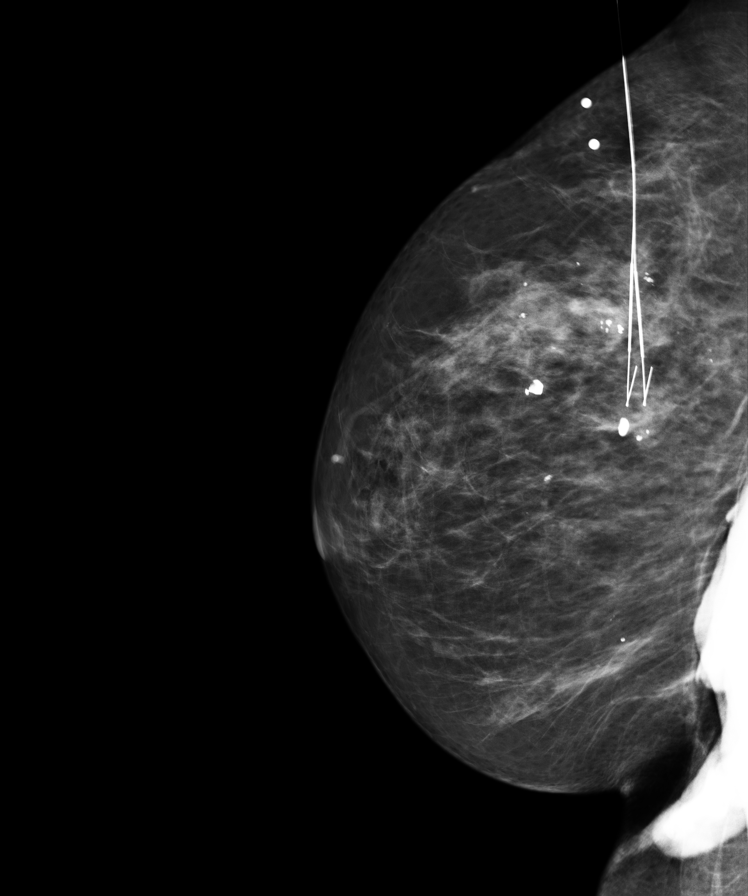

[R CC (2 of 2)]
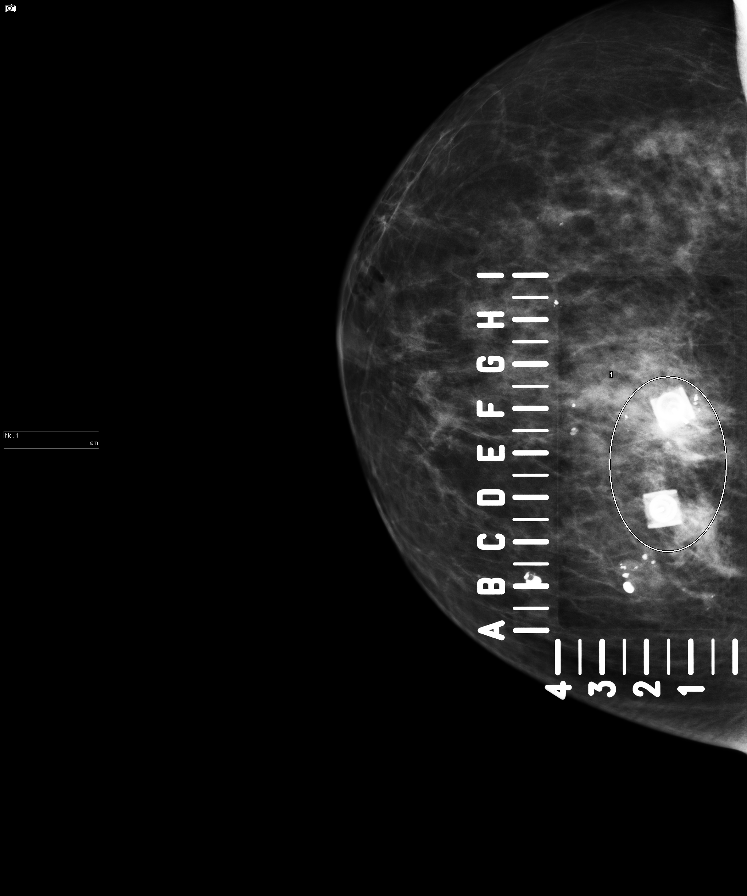

[R ML]
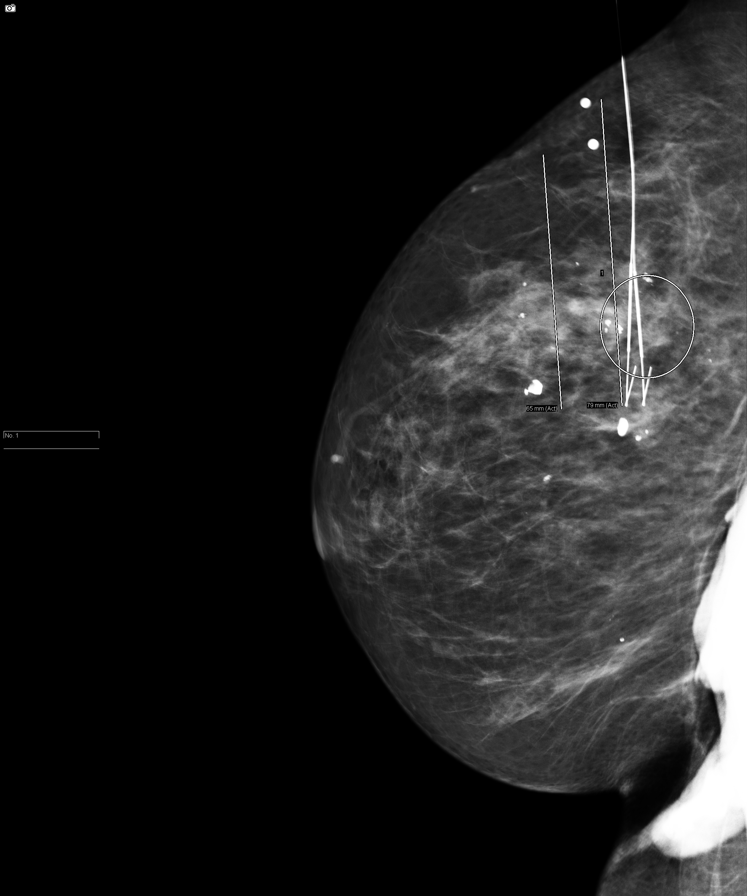

[8 of 8 positions shown; findings below may reference images not displayed]

FINDINGS: Patient presents for needle localization prior to bracketed 2 wire
localization of an area of calcifications. I met with the patient
and we discussed the procedure of needle localization including
benefits and alternatives. We discussed the high likelihood of a
successful procedure. We discussed the risks of the procedure,
including infection, bleeding, tissue injury, and further surgery.
Informed, written consent was given. The usual time-out protocol was
performed immediately prior to the procedure.

Using mammographic guidance, sterile technique, 2% lidocaine and two
7 cm modified Kopans needles, an area of residual calcifications and
biopsy clip or localized using superior to inferior approach. The
films were marked for Dr. Petrone.

Specimen radiograph was performed in the [HOSPITAL], and
confirms calcifications, a biopsy clip, and two intact wires present
in the tissue sample. The specimen was marked for pathology.
IMPRESSION: Needle localization (bracketing with 2 wires) of the right breast.
No apparent complications.

## 2013-12-05 ENCOUNTER — Encounter: Payer: Self-pay | Admitting: General Surgery

## 2013-12-05 ENCOUNTER — Ambulatory Visit: Payer: Self-pay | Admitting: General Surgery

## 2013-12-05 DIAGNOSIS — Z1239 Encounter for other screening for malignant neoplasm of breast: Secondary | ICD-10-CM | POA: Diagnosis not present

## 2013-12-05 DIAGNOSIS — Z853 Personal history of malignant neoplasm of breast: Secondary | ICD-10-CM | POA: Diagnosis not present

## 2013-12-05 DIAGNOSIS — R922 Inconclusive mammogram: Secondary | ICD-10-CM | POA: Diagnosis not present

## 2013-12-13 ENCOUNTER — Ambulatory Visit (INDEPENDENT_AMBULATORY_CARE_PROVIDER_SITE_OTHER): Payer: Medicare Other | Admitting: General Surgery

## 2013-12-13 ENCOUNTER — Encounter: Payer: Self-pay | Admitting: General Surgery

## 2013-12-13 VITALS — BP 130/70 | HR 64 | Resp 12 | Ht 64.0 in | Wt 154.0 lb

## 2013-12-13 DIAGNOSIS — D0511 Intraductal carcinoma in situ of right breast: Secondary | ICD-10-CM

## 2013-12-13 NOTE — Patient Instructions (Signed)
Patient will be asked to return to the office in one year with a bilateral diagnotic  mammogram. 

## 2013-12-13 NOTE — Progress Notes (Signed)
Patient ID: Cynthia Cynthia, female   DOB: 1931-04-16, 80 y.o.   MRN: [id number]  Chief Complaint  Patient presents with  . Follow-up    mammogram    HPI Cynthia Cynthia is a 80 y.o. female who presents for a breast evaluation. The most recent mammogram was done on10/5/15 .  Patient does perform regular self breast checks and gets regular mammograms done.    The patient recently noted a small skin tag on the left anterior chest. No history of bleeding or trauma.  HPI  Past Medical History  Diagnosis Date  . Diabetes mellitus   . Hypertension   . Hypercholesterolemia   . Allergic rhinitis   . Myasthenia gravis     ocular  . 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 Resected margins negative. Whole breast radiation  . Cancer 12-15-12    left upper arm, malignant melanoma     Past Surgical History  Procedure Laterality Date  . Eye surgery      Caterac both eyes  . Breast biopsy Right 2014  . Breast surgery Right 2014    lumpectomy    Family History  Problem Relation Age of Onset  . Skin cancer Father   . Cancer Father     colon  . Colon cancer Brother   . Cancer Brother     colon  . Breast cancer      niece    Social History History  Substance Use Topics  . Smoking status: Never Smoker   . Smokeless tobacco: Never Used  . Alcohol Use: No    Allergies  Allergen Reactions  . No Known Drug Allergy     Current Outpatient Prescriptions  Medication Sig Dispense Refill  . aspirin 81 MG tablet Take 81 mg by mouth daily.      Marland Kitchen atorvastatin (LIPITOR) 20 MG tablet Take 1 tablet (20 mg total) by mouth daily.  90 tablet  1  . Calcium Carbonate-Vitamin D (CALCIUM 600+D) 600-400 MG-UNIT per tablet Take 1 tablet by mouth 2 (two) times daily.      Marland Kitchen glucose blood (ACCU-CHEK AVIVA) test strip Use to check blood sugar twice a day. Dx. 250.00  200 each  3  . hydrochlorothiazide (HYDRODIURIL) 25 MG tablet TAKE 1 TABLET BY MOUTH EVERY DAY  90 tablet  1  .  Lancets MISC Accucheck soft click-Use BID (Dx. 235.57)  100 each  0  . potassium chloride (KLOR-CON 10) 10 MEQ tablet TAKE 1 TABLET (10 MEQ TOTAL) BY MOUTH DAILY.  90 tablet  2  . ramipril (ALTACE) 10 MG capsule Take 1 capsule (10 mg total) by mouth daily.  90 capsule  1  . tamoxifen (NOLVADEX) 20 MG tablet Take 1 tablet (20 mg total) by mouth daily.  90 tablet  3   No current facility-administered medications for this visit.    Review of Systems Review of Systems  Constitutional: Negative.   Respiratory: Negative.   Cardiovascular: Negative.     Blood pressure 130/70, pulse 64, resp. rate 12, height 5\' 4"  (1.626 m), weight 154 lb (69.854 kg).  Physical Exam Physical Exam  Constitutional: She is oriented to person, place, and time. She appears well-developed and well-nourished.  Eyes: Conjunctivae are normal. No scleral icterus.  Neck: Neck supple.  Cardiovascular: Normal rate, regular rhythm and normal heart sounds.   Pulmonary/Chest: Effort normal and breath sounds normal. Right breast exhibits no inverted nipple, no mass, no nipple discharge,  no skin change and no tenderness. Left breast exhibits no inverted nipple, no mass, no nipple discharge, no skin change and no tenderness.  Well healed incision right breast from 11 to  1o'clcok  Lymphadenopathy:    She has no cervical adenopathy.    She has no axillary adenopathy.  Neurological: She is alert and oriented to person, place, and time.  Skin: Skin is warm and dry.  1 cm  skin tag left upper chest wall    Data Reviewed Bilateral mammograms dated 12/05/2013 were reviewed. BI-RAD-2.  Expected post wide excision changes appreciated.  Assessment    Doing well now one year status post excision high-grade DCIS of the right breast.  Tolerating tamoxifen well.    Plan    Patient will be asked to return to the office in one year with a bilateral diagnotic mammogram.    PCP: Cynthia Cynthia 12/13/2013, 1:34 PM

## 2014-01-03 ENCOUNTER — Other Ambulatory Visit: Payer: Self-pay | Admitting: Internal Medicine

## 2014-01-17 DIAGNOSIS — H3554 Dystrophies primarily involving the retinal pigment epithelium: Secondary | ICD-10-CM | POA: Diagnosis not present

## 2014-03-04 ENCOUNTER — Other Ambulatory Visit: Payer: Self-pay | Admitting: General Surgery

## 2014-03-07 ENCOUNTER — Encounter: Payer: Self-pay | Admitting: Internal Medicine

## 2014-03-07 ENCOUNTER — Ambulatory Visit (INDEPENDENT_AMBULATORY_CARE_PROVIDER_SITE_OTHER): Payer: Medicare Other | Admitting: Internal Medicine

## 2014-03-07 VITALS — BP 130/77 | HR 64 | Temp 98.3°F | Ht 64.0 in | Wt 144.0 lb

## 2014-03-07 DIAGNOSIS — N3000 Acute cystitis without hematuria: Secondary | ICD-10-CM

## 2014-03-07 DIAGNOSIS — R300 Dysuria: Secondary | ICD-10-CM | POA: Diagnosis not present

## 2014-03-07 DIAGNOSIS — R200 Anesthesia of skin: Secondary | ICD-10-CM

## 2014-03-07 DIAGNOSIS — E119 Type 2 diabetes mellitus without complications: Secondary | ICD-10-CM | POA: Diagnosis not present

## 2014-03-07 DIAGNOSIS — R208 Other disturbances of skin sensation: Secondary | ICD-10-CM | POA: Diagnosis not present

## 2014-03-07 DIAGNOSIS — I10 Essential (primary) hypertension: Secondary | ICD-10-CM

## 2014-03-07 LAB — URINALYSIS, ROUTINE W REFLEX MICROSCOPIC
BILIRUBIN URINE: NEGATIVE
NITRITE: NEGATIVE
Specific Gravity, Urine: >=1.03 — AB (ref 1.000–1.030)
Total Protein, Urine: 30 — AB
UROBILINOGEN UA: 0.2 (ref 0.0–1.0)
Urine Glucose: NEGATIVE
pH: 5.5 (ref 5.0–8.0)

## 2014-03-07 NOTE — Patient Instructions (Signed)
Take the steroid taper as directed.    I am going to get you scheduled to see Dr Manuella Ghazi for a nerve test.

## 2014-03-07 NOTE — Progress Notes (Signed)
Pre visit review using our clinic review tool, if applicable. No additional management support is needed unless otherwise documented below in the visit note. 

## 2014-03-10 ENCOUNTER — Other Ambulatory Visit: Payer: Self-pay | Admitting: Internal Medicine

## 2014-03-10 LAB — CULTURE, URINE COMPREHENSIVE

## 2014-03-10 MED ORDER — AMOXICILLIN-POT CLAVULANATE 500-125 MG PO TABS: 1.0000 | ORAL_TABLET | Freq: Two times a day (BID) | ORAL | Status: DC

## 2014-03-10 NOTE — Progress Notes (Signed)
Order placed for augmentin bid x 5 days for uti.  Left message on pts machine.

## 2014-03-12 ENCOUNTER — Encounter: Payer: Self-pay | Admitting: Internal Medicine

## 2014-03-12 NOTE — Progress Notes (Signed)
Subjective:    Patient ID: Cynthia [name], female    DOB: 29-Oct-1931, 81 y.o.   MRN: [id number]  Leg Pain   81 year old female with past history of hypercholesterolemia, hypertension, diabetes, allergic rhinitis/sinusitis and occular Myasthenia Gravis (followed by Dr Manuella Ghazi).  She also recently has been diagnosed with breast cancer.  Completed her XRT.   On tamoxifen.  She is also followed by Dr Bary Castilla for her malignant melanoma.  She comes in today as a work in with concerns regarding persistent left foot and leg pain.  She reports left foot numbness.  Pain posterior leg that stops just below her buttock.  Started five weeks ago.  She reports numbness in her toes and feet.  This has not been gradually coming on, just started bothering her 5 weeks ago.  Taking ibuprofen occasionally.   No chest pain or tightness.  Breathing stable.  Eating and drinking well.  No nausea or vomiting.  No bowel change.  She does report some dysuria.     Past Medical History  Diagnosis Date  . Diabetes mellitus   . Hypertension   . Hypercholesterolemia   . Allergic rhinitis   . Myasthenia gravis     ocular  . 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 Resected margins negative. Whole breast radiation  . Cancer 12-15-12    left upper arm, malignant melanoma     Current Outpatient Prescriptions on File Prior to Visit  Medication Sig Dispense Refill  . aspirin 81 MG tablet Take 81 mg by mouth daily.    Marland Kitchen atorvastatin (LIPITOR) 20 MG tablet Take 1 tablet (20 mg total) by mouth daily. 90 tablet 1  . Calcium Carbonate-Vitamin D (CALCIUM 600+D) 600-400 MG-UNIT per tablet Take 1 tablet by mouth 2 (two) times daily.    Marland Kitchen glucose blood (ACCU-CHEK AVIVA) test strip Use to check blood sugar twice a day. Dx. 250.00 200 each 3  . hydrochlorothiazide (HYDRODIURIL) 25 MG tablet TAKE 1 TABLET EVERY DAY 90 tablet 1  . Lancets MISC Accucheck soft click-Use BID (Dx. 448.18) 100 each 0  . potassium chloride  (K-DUR) 10 MEQ tablet TAKE 1 TABLET EVERY DAY 90 tablet 1  . ramipril (ALTACE) 10 MG capsule Take 1 capsule (10 mg total) by mouth daily. 90 capsule 1  . tamoxifen (NOLVADEX) 20 MG tablet TAKE 1 TABLET EVERY DAY 90 tablet 3   No current facility-administered medications on file prior to visit.    Review of Systems Patient denies any headache, lightheadedness or dizziness.   No chest pain, tightness or palpitations.  No increased shortness of breath.  No nausea or vomiting.  No acid reflux.  No abdominal pain or cramping.  No bowel change, such as diarrhea, constipation, BRBPR or melana.  Dysuria as outlined.  Leg pain and foot numbness as outlined.  Sugars she reports - doing well.           Objective:   Physical Exam  Filed Vitals:   03/07/14 0833  BP: 130/77  Pulse: 64  Temp: 98.3 F (39.37 C)   81 year old female in no acute distress.  NECK:  Supple.  Nontender.  No audible bruit.  HEART:  Appears to be regular. LUNGS:  No crackles or wheezing audible.  Respirations even and unlabored.  RADIAL PULSE:  Equal bilaterally. ABDOMEN:  Soft, nontender.  Bowel sounds present and normal.  No audible abdominal bruit. EXTREMITIES:  No increased edema present.  DP pulses palpable and equal bilaterally.      FEET:  No lesions.   MSK:  No motor weakness identified.  Some minimal decreased sensation to pin prick feet.        Assessment & Plan:  1. Left leg numbness Left foot numbness and left leg pain as outlined.  No focal motor weakness found on exam.  Treat with medrol dose pack - 6 day taper.  Discussed with her regarding evaluation.  Hold on xray.  Refer to neurology for further evaluation and question of need for NCS.   - Ambulatory referral to Neurology  2. Dysuria Symptoms as outlined.  Check urine.  Hold abx until review culture.  - Urinalysis, Routine w reflex microscopic - CULTURE, URINE COMPREHENSIVE  3. Type 2 diabetes mellitus without complication Sugars have been under  good control.  Instructed may increase on prednisone.  Follow.    4. Acute cystitis without hematuria Check urine as outlned.    5. Essential hypertension Blood pressure dong well.  Follow.

## 2014-03-13 ENCOUNTER — Other Ambulatory Visit: Payer: Self-pay | Admitting: *Deleted

## 2014-03-13 MED ORDER — ATORVASTATIN CALCIUM 20 MG PO TABS: 20.0000 mg | ORAL_TABLET | Freq: Every day | ORAL | Status: DC

## 2014-03-21 ENCOUNTER — Ambulatory Visit: Payer: No Typology Code available for payment source | Admitting: Internal Medicine

## 2014-03-21 ENCOUNTER — Telehealth: Payer: Self-pay | Admitting: Internal Medicine

## 2014-03-21 NOTE — Telephone Encounter (Signed)
Spoke with Cynthia [name], she states she is not having continual symptoms.  However she says she is having symptomsme of the time.  She only wants to see Dr Nicki Reaper.  She said she will call back next week for availability.

## 2014-03-21 NOTE — Telephone Encounter (Signed)
Noted.  She will call to be worked in.  If any problems before, I do recommend evaluation before next week.

## 2014-03-21 NOTE — Telephone Encounter (Signed)
Pt requests a refill for UTI.  Please advise

## 2014-03-21 NOTE — Telephone Encounter (Signed)
If she is having persistent symptoms, she needs to be reevaluated.  Let her know I am not in the office today.

## 2014-03-21 NOTE — Telephone Encounter (Signed)
amoxicillin-clavulanate (AUGMENTIN) 500-125 MG per tablet

## 2014-03-23 DIAGNOSIS — R109 Unspecified abdominal pain: Secondary | ICD-10-CM | POA: Diagnosis not present

## 2014-03-23 DIAGNOSIS — R200 Anesthesia of skin: Secondary | ICD-10-CM | POA: Diagnosis not present

## 2014-03-23 DIAGNOSIS — M25572 Pain in left ankle and joints of left foot: Secondary | ICD-10-CM | POA: Diagnosis not present

## 2014-04-17 DIAGNOSIS — R200 Anesthesia of skin: Secondary | ICD-10-CM | POA: Diagnosis not present

## 2014-04-18 DIAGNOSIS — I878 Other specified disorders of veins: Secondary | ICD-10-CM | POA: Diagnosis not present

## 2014-04-18 DIAGNOSIS — M79662 Pain in left lower leg: Secondary | ICD-10-CM | POA: Diagnosis not present

## 2014-04-18 DIAGNOSIS — M67372 Transient synovitis, left ankle and foot: Secondary | ICD-10-CM | POA: Diagnosis not present

## 2014-05-02 ENCOUNTER — Other Ambulatory Visit: Payer: Self-pay | Admitting: Internal Medicine

## 2014-05-02 ENCOUNTER — Telehealth: Payer: Self-pay

## 2014-05-02 DIAGNOSIS — I878 Other specified disorders of veins: Secondary | ICD-10-CM | POA: Diagnosis not present

## 2014-05-02 DIAGNOSIS — M67372 Transient synovitis, left ankle and foot: Secondary | ICD-10-CM | POA: Diagnosis not present

## 2014-05-02 DIAGNOSIS — M79662 Pain in left lower leg: Secondary | ICD-10-CM | POA: Diagnosis not present

## 2014-05-02 NOTE — Telephone Encounter (Signed)
rx already sent. 

## 2014-05-02 NOTE — Telephone Encounter (Signed)
The patient called and is hoping to get a refill on her ramipril rx

## 2014-06-16 NOTE — Consult Note (Signed)
Reason for Visit: This 81 year old Female patient presents to the clinic for initial evaluation of  breast cancer .   Referred by Dr. Hervey Ard.  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  Pathology Report pathology report reviewed   Imaging Report mammograms reviewed   Referral Report clinical much reviewed   Planned Treatment Regimen whole breast radiation versus accelerated partial breast radiation   HPI   patient is a 81 year old female who presented with an abnormal mammogramshowing a new area of clustered calcifications posteriorly in the 12:00 position of the right breast measuring approximately 3 cm.she underwent biopsy showing ductal carcinoma in situ. Went on to have a wide local excision.  residual ductal carcinoma was seen. assessment was initial tumor size was 1 cm. Margins were clear at at least 7 mm. Tumor was strongly ER/PR positive.patient has done well postoperatively and is without complaints. She specifically denies breast tenderness cough or bone pain. She seen today for treatment options. She is thought to be a candidate for MammoSite balloon placement.   Past Hx:    DCIS Right Breast:    Hypertension:    Hypercholesterolemia:   Past, Family and Social History:  Past Medical History positive   Cardiovascular hyperlipidemia; hypertension   Respiratory llergic rhinitis   Endocrine diabetes mellitus   Past Surgical History I surgery   Past Medical History Comments myasthenia gravis   Family History positive   Family History Comments father with colon cancer brother with colon cancer niece with breast cancer   Social History noncontributory   Additional Past Medical and Surgical History accompanied by a friend today whose husband was a former patient of mine   Allergies:    No Known Allergies:   Home Meds:  Home Medications: Medication Instructions Status  Nature's Bounty Hair Skin & Nails Multiple Vitamins oral tablet, chewable 1 tab(s) orally once a day Active  Klor-Con 10 10 mEq oral tablet, extended release 1 tab(s) orally once a day (in the morning) Active  Aspirin Enteric Coated 81 mg oral delayed release tablet 1 tab(s) orally once a day (in the morning) Active  ramipril 10 mg oral capsule 1 cap(s) orally once a day (in the morning) Active  Lipitor 20 mg oral tablet 1 tab(s) orally once a day (at bedtime) Active  Calcium 600+D 1 tab(s) orally 2 times a day Active  hydrochlorothiazide 25 mg oral tablet 1 tab(s) orally once a day (in the morning) Active   Review of Systems:  General negative   Performance Status (ECOG) 0   Skin negative   Breast see HPI   Ophthalmologic negative   ENMT negative   Respiratory and Thorax negative   Cardiovascular negative   Gastrointestinal negative   Genitourinary negative   Musculoskeletal negative   Neurological negative   Psychiatric negative   Hematology/Lymphatics negative   Endocrine negative   Allergic/Immunologic negative   Review of Systems   eview of systems obtained from nurse's notes  Nursing Notes:  Nursing Vital Signs and Chemo Nursing Nursing Notes: *CC Vital Signs Flowsheet:   27-Oct-14 11:03  Temp Temperature 97.8  Pulse Pulse 66  Respirations Respirations 20  SBP SBP 131  DBP DBP 74  Pain Scale (0-10)  0  Current Weight (kg) (kg) 66.3  Height (cm) centimeters 160.9  BSA (m2) 1.6   Physical  Exam:  General/Skin/HEENT:  General normal   Skin normal   Eyes normal   ENMT normal   Head and Neck normal   Additional PE well-developed female looks younger than stated age. Lungs are clear to A&P cardiac examination shows regular rate and rhythm. She status post wide local excision the right breast incision is well-healed. No dominant mass or nodularity is noted  in either breast into position examined. No axillary or supraclavicular adenopathy is appreciated.   Breasts/Resp/CV/GI/GU:  Respiratory and Thorax normal   Cardiovascular normal   Gastrointestinal normal   Genitourinary normal   MS/Neuro/Psych/Lymph:  Musculoskeletal normal   Neurological normal   Lymphatics normal   Other Results:  Radiology Results: LabUnknown:    16-Sep-14 13:16, Digital Additional Views Rt Breast (SCR)  PACS Image   [facility]:  Digital Additional Views Rt Breast (SCR)   REASON FOR EXAM:    av rt calcifications  COMMENTS:       PROCEDURE: MAM - MAM DIG ADDVIEWS RT SCR  - Nov 09 2012  1:16PM     *RADIOLOGY REPORT*    Clinical Data:  Further evaluation of right breast calcifications    DIGITAL DIAGNOSTIC RIGHT MAMMOGRAM    Comparison: Multiple prior studies the most recent of which is  11/02/2012    Findings:  ACR Breast Density Category c:  The breast tissue is  heterogeneously dense, which may obscure small masses.    There are clustered calcifications posteriorly in the 12 o'clock  position of the right breast.  These are pleomorphic and represent  change when compared to prior examinations.  The cluster of  calcifications measures 29 x 10 mm.    CAD/BLANK     IMPRESSION:  No suspicious right breast calcifications    BI-RADS CATEGORY 4:  Suspicious abnormality - biopsy should be  considered.  RECOMMENDATION:  Stereotactic core needle biopsy is recommended.  The patient will  be scheduled for an appointment at her convenience.  I gave this  information tothe patient's referring physician Dr. Nicki Reaper by  telephone at 1320 hours on 11/09/2012.    I have discussed the findings and recommendations with the patient.  Results were also provided in writing at the conclusion of the  visit.  If applicable, a reminder letter will be sent to the  patient regarding her next appointment.      Original Report Authenticated By: Skipper Cliche, M.D.       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 Risks and benefits of both types of procedures were reviewed. She seems a little reluctant about having further surgical procedure although I have instructed her that is a minor procedure in that both treatments in her case would be equal as terms of results. She is leaning toward whole breast radiation. Based on the fact that DCIS is considered cautionary although excellent results have been reported in the literature I have set her up for CT simulation in about a week's time. We'll give her some more time to think about options of accelerated partial breast irradiation. Risks and benefits of whole breast radiation including  skin reaction, fatigue, inclusion of some superficial lung and possible alteration of blood counts were all explained in detail to the patient. She seems to comprehend my treatment plan well.  I would like to take this opportunity to thank you for allowing me to continue to participate in this patient's care.  CC Referral:  cc: dr. Hervey Ard, Dr. Einar Pheasant   Electronic Signatures: Baruch Gouty, Roda Shutters (MD)  (Signed 27-Oct-14 12:29)  Authored: HPI, Diagnosis, Past Hx, PFSH, Allergies, Home Meds, ROS, Nursing Notes, Physical Exam, Other Results, Relevent Results, Encounter Assessment and Plan, CC Referring Physician   Last Updated: 27-Oct-14 12:29 by Armstead Peaks (MD)

## 2014-06-16 NOTE — Op Note (Signed)
PATIENT NAME:  Cynthia [name], Cynthia [name] MR#:  [id number] DATE OF BIRTH:  [birth year]  DATE OF PROCEDURE:  12/08/2012  PREOPERATIVE DIAGNOSIS: Ductal carcinoma in situ, right breast.   POSTOPERATIVE DIAGNOSIS:  Ductal carcinoma in situ, right breast.  OPERATIVE PROCEDURE: Wide local excision, right breast DCIS, sentinel node biopsy.   SURGEON: Hervey Ard, M.D.   ANESTHESIA: General by LMA under Dr. Kayleen Memos, Marcaine 0.5% with 1:200,000 units of epinephrine, 30 mL local infiltration.   ESTIMATED BLOOD LOSS: Less than 5 mL.   CLINICAL NOTE: This 81 year old woman underwent a biopsy showing evidence of DCIS. She desired breast conservation. She was felt to be a candidate for wide local excision. Sentinel node biopsy was recommended due to the high-grade DCIS and the possibility of occult micrometastatic, microinvasive disease.   OPERATIVE NOTE: With the patient under adequate general anesthesia and having received Kefzol due to her history of diabetes, the breast, axilla and localizing wires were prepped with ChloraPrep and draped. Ultrasound was used to confirm the location of the previous biopsy clip. A transverse incision was made from the 10 o'clock  to 2 o'clock position in the right breast. The localizing wires were brought into the wound. The dissection was carried out from the subcutaneous fat down to the pectoralis fascia. The excised specimen was radiographed showing both wires intact, as well as the previously placed biopsy clip. It was sent fresh to pathology per protocol. The Gamma Finder was used, and an area of increased uptake appreciated through the primary incision site. Of note, 4 mL of methylene blue diluted 1:2 with normal saline was injected in the subareolar plexus at the beginning of the procedure. A single hot blue lymph node was identified in the right axilla, and this was sent in formalin for permanent section. Good hemostasis was noted. Marcaine had been infiltrated for  postoperative analgesia. The breast parenchyma was elevated off the underlying pectoralis fascia circumferentially and then approximated with interrupted 2-0 Vicryl figure-of-eight sutures. The adipose layer was approximated in a similar fashion. The skin was closed with a running 4-0 Vicryl subcuticular suture. Benzoin and Steri-Strips followed by Telfa pad, fluff gauze, Kerlix and Ace wrap was applied.   The patient tolerated the procedure well and was taken to the recovery room in stable condition.     ____________________________ Robert Bellow, MD jwb:dmm D: 12/08/2012 21:54:47 ET T: 12/08/2012 23:01:21 ET JOB#: [id number]  cc: Robert Bellow, MD, <Dictator> Einar Pheasant, MD Niclas Markell Amedeo Kinsman MD ELECTRONICALLY SIGNED 12/09/2012 8:51

## 2014-06-19 DIAGNOSIS — G7000 Myasthenia gravis without (acute) exacerbation: Secondary | ICD-10-CM | POA: Diagnosis not present

## 2014-06-19 DIAGNOSIS — M25572 Pain in left ankle and joints of left foot: Secondary | ICD-10-CM | POA: Diagnosis not present

## 2014-06-19 DIAGNOSIS — R200 Anesthesia of skin: Secondary | ICD-10-CM | POA: Diagnosis not present

## 2014-06-27 DIAGNOSIS — M7989 Other specified soft tissue disorders: Secondary | ICD-10-CM | POA: Diagnosis not present

## 2014-06-27 DIAGNOSIS — I8310 Varicose veins of unspecified lower extremity with inflammation: Secondary | ICD-10-CM | POA: Diagnosis not present

## 2014-06-27 DIAGNOSIS — M79609 Pain in unspecified limb: Secondary | ICD-10-CM | POA: Diagnosis not present

## 2014-07-10 ENCOUNTER — Other Ambulatory Visit: Payer: Self-pay | Admitting: Internal Medicine

## 2014-08-27 DIAGNOSIS — R05 Cough: Secondary | ICD-10-CM | POA: Diagnosis not present

## 2014-08-29 DIAGNOSIS — M79609 Pain in unspecified limb: Secondary | ICD-10-CM | POA: Diagnosis not present

## 2014-08-29 DIAGNOSIS — M7989 Other specified soft tissue disorders: Secondary | ICD-10-CM | POA: Diagnosis not present

## 2014-08-29 DIAGNOSIS — I8310 Varicose veins of unspecified lower extremity with inflammation: Secondary | ICD-10-CM | POA: Diagnosis not present

## 2014-08-30 ENCOUNTER — Other Ambulatory Visit: Payer: Self-pay | Admitting: Vascular Surgery

## 2014-08-30 DIAGNOSIS — M25572 Pain in left ankle and joints of left foot: Secondary | ICD-10-CM

## 2014-08-30 DIAGNOSIS — M79672 Pain in left foot: Secondary | ICD-10-CM

## 2014-08-31 ENCOUNTER — Ambulatory Visit
Admission: RE | Admit: 2014-08-31 | Discharge: 2014-08-31 | Disposition: A | Discharge status: Home / Self Care | Payer: Medicare Other | Source: Ambulatory Visit | Attending: Vascular Surgery | Admitting: Vascular Surgery

## 2014-08-31 DIAGNOSIS — M19072 Primary osteoarthritis, left ankle and foot: Secondary | ICD-10-CM | POA: Diagnosis not present

## 2014-08-31 DIAGNOSIS — M25572 Pain in left ankle and joints of left foot: Secondary | ICD-10-CM | POA: Diagnosis present

## 2014-08-31 DIAGNOSIS — M79672 Pain in left foot: Secondary | ICD-10-CM | POA: Diagnosis present

## 2014-09-01 ENCOUNTER — Other Ambulatory Visit: Payer: Self-pay | Admitting: Internal Medicine

## 2014-09-01 ENCOUNTER — Other Ambulatory Visit: Payer: Self-pay | Admitting: *Deleted

## 2014-09-01 MED ORDER — GLUCOSE BLOOD VI STRP: ORAL_STRIP | Status: DC

## 2014-10-03 ENCOUNTER — Other Ambulatory Visit: Payer: Self-pay

## 2014-10-03 DIAGNOSIS — D0511 Intraductal carcinoma in situ of right breast: Secondary | ICD-10-CM

## 2014-10-04 ENCOUNTER — Other Ambulatory Visit: Payer: Self-pay | Admitting: Internal Medicine

## 2014-10-17 DIAGNOSIS — Z961 Presence of intraocular lens: Secondary | ICD-10-CM | POA: Diagnosis not present

## 2014-10-17 LAB — HM DIABETES EYE EXAM

## 2014-10-20 ENCOUNTER — Encounter: Payer: Self-pay | Admitting: Internal Medicine

## 2014-10-27 ENCOUNTER — Other Ambulatory Visit: Payer: Self-pay | Admitting: Internal Medicine

## 2014-12-06 ENCOUNTER — Other Ambulatory Visit: Payer: Self-pay | Admitting: Internal Medicine

## 2014-12-06 ENCOUNTER — Other Ambulatory Visit: Payer: No Typology Code available for payment source

## 2014-12-07 ENCOUNTER — Telehealth: Payer: Self-pay | Admitting: *Deleted

## 2014-12-07 ENCOUNTER — Other Ambulatory Visit: Payer: No Typology Code available for payment source

## 2014-12-07 ENCOUNTER — Other Ambulatory Visit: Payer: Self-pay | Admitting: *Deleted

## 2014-12-07 ENCOUNTER — Other Ambulatory Visit (INDEPENDENT_AMBULATORY_CARE_PROVIDER_SITE_OTHER): Payer: Medicare Other

## 2014-12-07 DIAGNOSIS — E119 Type 2 diabetes mellitus without complications: Secondary | ICD-10-CM

## 2014-12-07 DIAGNOSIS — E7800 Pure hypercholesterolemia, unspecified: Secondary | ICD-10-CM

## 2014-12-07 DIAGNOSIS — I10 Essential (primary) hypertension: Secondary | ICD-10-CM

## 2014-12-07 DIAGNOSIS — M8580 Other specified disorders of bone density and structure, unspecified site: Secondary | ICD-10-CM

## 2014-12-07 DIAGNOSIS — C50911 Malignant neoplasm of unspecified site of right female breast: Secondary | ICD-10-CM | POA: Diagnosis not present

## 2014-12-07 LAB — CBC WITH DIFFERENTIAL/PLATELET
BASOS PCT: 0.6 % (ref 0.0–3.0)
Basophils Absolute: 0 10*3/uL (ref 0.0–0.1)
EOS PCT: 2.6 % (ref 0.0–5.0)
Eosinophils Absolute: 0.2 10*3/uL (ref 0.0–0.7)
HCT: 37.8 % (ref 36.0–46.0)
HEMOGLOBIN: 12.4 g/dL (ref 12.0–15.0)
LYMPHS ABS: 1.8 10*3/uL (ref 0.7–4.0)
Lymphocytes Relative: 28.4 % (ref 12.0–46.0)
MCHC: 32.9 g/dL (ref 30.0–36.0)
MCV: 86.2 fl (ref 78.0–100.0)
MONOS PCT: 6.3 % (ref 3.0–12.0)
Monocytes Absolute: 0.4 10*3/uL (ref 0.1–1.0)
Neutro Abs: 4 10*3/uL (ref 1.4–7.7)
Neutrophils Relative %: 62.1 % (ref 43.0–77.0)
Platelets: 182 10*3/uL (ref 150.0–400.0)
RBC: 4.39 Mil/uL (ref 3.87–5.11)
RDW: 13.4 % (ref 11.5–15.5)
WBC: 6.4 10*3/uL (ref 4.0–10.5)

## 2014-12-07 LAB — HEPATIC FUNCTION PANEL
ALBUMIN: 3.4 g/dL — AB (ref 3.5–5.2)
ALT: 14 U/L (ref 0–35)
AST: 17 U/L (ref 0–37)
Alkaline Phosphatase: 50 U/L (ref 39–117)
Bilirubin, Direct: 0.1 mg/dL (ref 0.0–0.3)
Total Bilirubin: 0.5 mg/dL (ref 0.2–1.2)
Total Protein: 6.2 g/dL (ref 6.0–8.3)

## 2014-12-07 LAB — LIPID PANEL
CHOL/HDL RATIO: 3
Cholesterol: 126 mg/dL (ref 0–200)
HDL: 39.3 mg/dL (ref >39.00)
LDL Cholesterol: 59 mg/dL (ref 0–99)
NONHDL: 86.66
Triglycerides: 136 mg/dL (ref 0.0–149.0)
VLDL: 27.2 mg/dL (ref 0.0–40.0)

## 2014-12-07 LAB — TSH: TSH: 0.77 u[IU]/mL (ref 0.35–4.50)

## 2014-12-07 LAB — BASIC METABOLIC PANEL
BUN: 13 mg/dL (ref 6–23)
CHLORIDE: 106 meq/L (ref 96–112)
CO2: 29 mEq/L (ref 19–32)
CREATININE: 0.99 mg/dL (ref 0.40–1.20)
Calcium: 9.5 mg/dL (ref 8.4–10.5)
GFR: 57.21 mL/min — ABNORMAL LOW (ref >60.00)
Glucose, Bld: 135 mg/dL — ABNORMAL HIGH (ref 70–99)
POTASSIUM: 4.3 meq/L (ref 3.5–5.1)
SODIUM: 142 meq/L (ref 135–145)

## 2014-12-07 LAB — HEMOGLOBIN A1C: HEMOGLOBIN A1C: 7.3 % — AB (ref 4.6–6.5)

## 2014-12-07 NOTE — Telephone Encounter (Signed)
Labs and dx?  

## 2014-12-07 NOTE — Telephone Encounter (Signed)
Order placed for labs.

## 2014-12-11 ENCOUNTER — Telehealth: Payer: Self-pay | Admitting: *Deleted

## 2014-12-11 ENCOUNTER — Other Ambulatory Visit: Payer: Self-pay | Admitting: *Deleted

## 2014-12-11 MED ORDER — ACCU-CHEK SOFTCLIX LANCETS MISC: Status: DC

## 2014-12-11 NOTE — Telephone Encounter (Signed)
The diagnosis code for diabetes is E11.9.  I am not sure if this is what she needed or all she needed.  Please clarify and let me know if I need to do anything more.  If just needs test strips sent in, ok to refill.

## 2014-12-11 NOTE — Telephone Encounter (Signed)
Patient requested a code for  accu check soft click to be ordered

## 2014-12-11 NOTE — Telephone Encounter (Signed)
Pt called back stating the pharmacy needs the code of E11.9 to be called in by the nurse. Pharmacy is [facility] on [address] [phone]. Thank You!

## 2014-12-12 ENCOUNTER — Ambulatory Visit
Admission: RE | Admit: 2014-12-12 | Discharge: 2014-12-12 | Disposition: A | Discharge status: Home / Self Care | Payer: Medicare Other | Source: Ambulatory Visit | Attending: General Surgery | Admitting: General Surgery

## 2014-12-12 ENCOUNTER — Other Ambulatory Visit: Payer: Self-pay

## 2014-12-12 ENCOUNTER — Other Ambulatory Visit: Payer: Self-pay | Admitting: General Surgery

## 2014-12-12 DIAGNOSIS — D0511 Intraductal carcinoma in situ of right breast: Secondary | ICD-10-CM

## 2014-12-12 DIAGNOSIS — Z9889 Other specified postprocedural states: Secondary | ICD-10-CM | POA: Diagnosis not present

## 2014-12-12 DIAGNOSIS — Z853 Personal history of malignant neoplasm of breast: Secondary | ICD-10-CM | POA: Diagnosis not present

## 2014-12-12 HISTORY — DX: Malignant neoplasm of unspecified site of unspecified female breast: C50.919

## 2014-12-12 HISTORY — DX: Personal history of irradiation: Z92.3

## 2014-12-12 MED ORDER — ACCU-CHEK SOFTCLIX LANCETS MISC: Status: DC

## 2014-12-12 NOTE — Telephone Encounter (Signed)
Attempted to call pharmacy, would not take a verbal from me, resent prescription per request.

## 2014-12-14 ENCOUNTER — Ambulatory Visit (INDEPENDENT_AMBULATORY_CARE_PROVIDER_SITE_OTHER): Payer: Medicare Other | Admitting: Internal Medicine

## 2014-12-14 ENCOUNTER — Encounter: Payer: Self-pay | Admitting: Internal Medicine

## 2014-12-14 VITALS — BP 100/50 | HR 86 | Temp 98.4°F | Resp 18 | Ht 64.0 in | Wt 142.5 lb

## 2014-12-14 DIAGNOSIS — I10 Essential (primary) hypertension: Secondary | ICD-10-CM | POA: Diagnosis not present

## 2014-12-14 DIAGNOSIS — Z91048 Other nonmedicinal substance allergy status: Secondary | ICD-10-CM

## 2014-12-14 DIAGNOSIS — Z23 Encounter for immunization: Secondary | ICD-10-CM | POA: Diagnosis not present

## 2014-12-14 DIAGNOSIS — Z9109 Other allergy status, other than to drugs and biological substances: Secondary | ICD-10-CM

## 2014-12-14 DIAGNOSIS — R208 Other disturbances of skin sensation: Secondary | ICD-10-CM

## 2014-12-14 DIAGNOSIS — C50911 Malignant neoplasm of unspecified site of right female breast: Secondary | ICD-10-CM

## 2014-12-14 DIAGNOSIS — E7800 Pure hypercholesterolemia, unspecified: Secondary | ICD-10-CM

## 2014-12-14 DIAGNOSIS — G7000 Myasthenia gravis without (acute) exacerbation: Secondary | ICD-10-CM | POA: Diagnosis not present

## 2014-12-14 DIAGNOSIS — C439 Malignant melanoma of skin, unspecified: Secondary | ICD-10-CM

## 2014-12-14 DIAGNOSIS — R200 Anesthesia of skin: Secondary | ICD-10-CM

## 2014-12-14 DIAGNOSIS — E119 Type 2 diabetes mellitus without complications: Secondary | ICD-10-CM

## 2014-12-14 NOTE — Progress Notes (Signed)
Pre-visit discussion using our clinic review tool. No additional management support is needed unless otherwise documented below in the visit note.  

## 2014-12-14 NOTE — Progress Notes (Signed)
Patient ID: Cynthia [name], female   DOB: [birth year], 81 y.o.   MRN: [id number]   Subjective:    Patient ID: Cynthia [name], female    DOB: December 02, 1931, 81 y.o.   MRN: [id number]  HPI  Patient with past history of hypertension, diabetes, melanoma, myasthenia gravis and hypercholesterolemia who comes in today to follow up on these issues.  She tries to stay active.  No cardiac symptoms with increased activity or exertion.  No sob.  No acid reflux.  No abdominal pain or cramping.  No bowel change.  Has peripheral neuropathy.  On gabapentin.  Stable.  Sees Dr Manuella Ghazi for her neuropathy.  Sugars are doing well.  Tries to watch what she eats.    Past Medical History  Diagnosis Date  . Diabetes mellitus ([place])   . Hypertension   . Hypercholesterolemia   . Allergic rhinitis   . Myasthenia gravis ([place])     ocular  . Cancer ([place]) 2014    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 Resected margins negative. Whole breast radiation  . Cancer ([place]) 12-15-12    left upper arm, malignant melanoma   . Breast cancer ([place]) 2014    RT LUMPECTOMY  . S/P radiation therapy 2015    BREAST CA   Past Surgical History  Procedure Laterality Date  . Eye surgery      Caterac both eyes  . Breast surgery Right 2014    lumpectomy  . Breast biopsy Right 2014    DCIS   Family History  Problem Relation Age of Onset  . Skin cancer Father   . Cancer Father     colon  . Colon cancer Brother   . Cancer Brother     colon  . Breast cancer      niece   Social History   Social History  . Marital Status: Divorced    Spouse Name: N/A  . Number of Children: N/A  . Years of Education: N/A   Social History Main Topics  . Smoking status: Never Smoker   . Smokeless tobacco: Never Used  . Alcohol Use: No  . Drug Use: No  . Sexual Activity: No   Other Topics Concern  . None   Social History Narrative    Outpatient Encounter Prescriptions as of 12/14/2014  Medication Sig  . ACCU-CHEK SOFTCLIX LANCETS  lancets USE TWICE A DAY  . aspirin 81 MG tablet Take 81 mg by mouth daily.  Marland Kitchen atorvastatin (LIPITOR) 20 MG tablet TAKE 1 TABLET EVERY DAY  . Calcium Carbonate-Vitamin D (CALCIUM 600+D) 600-400 MG-UNIT per tablet Take 1 tablet by mouth 2 (two) times daily.  Marland Kitchen gabapentin (NEURONTIN) 100 MG capsule Take 100 mg by mouth at bedtime.  Marland Kitchen glucose blood (ACCU-CHEK AVIVA PLUS) test strip USE TO CHECK BLOOD SUGAR TWICE A DAY. DX. E11.9  . hydrochlorothiazide (HYDRODIURIL) 25 MG tablet TAKE 1 TABLET EVERY DAY  . ibuprofen (ADVIL,MOTRIN) 200 MG tablet Take 200 mg by mouth every 6 (six) hours as needed.  . potassium chloride (K-DUR,KLOR-CON) 10 MEQ tablet TAKE 1 TABLET EVERY DAY  . ramipril (ALTACE) 10 MG capsule TAKE 1 CAPSULE EVERY DAY  . tamoxifen (NOLVADEX) 20 MG tablet TAKE 1 TABLET EVERY DAY  . [DISCONTINUED] amoxicillin-clavulanate (AUGMENTIN) 500-125 MG per tablet Take 1 tablet (500 mg total) by mouth 2 (two) times daily.  . [DISCONTINUED] potassium chloride (K-DUR) 10 MEQ tablet TAKE 1 TABLET EVERY DAY   No facility-administered encounter medications on file as  of 12/14/2014.    Review of Systems  Constitutional: Negative for appetite change and unexpected weight change.  HENT: Negative for congestion and sinus pressure.   Eyes: Negative for discharge and visual disturbance.  Respiratory: Negative for cough, chest tightness and shortness of breath.   Cardiovascular: Negative for chest pain, palpitations and leg swelling.  Gastrointestinal: Negative for nausea, vomiting, abdominal pain and diarrhea.  Genitourinary: Negative for dysuria and difficulty urinating.  Musculoskeletal: Negative for back pain and joint swelling.  Skin: Negative for color change and rash.  Neurological: Negative for dizziness, light-headedness and headaches.       Has peripheral neuropathy.  On gabapentin.    Psychiatric/Behavioral: Negative for dysphoric mood and agitation.       Objective:    Physical Exam    Constitutional: She appears well-developed and well-nourished. No distress.  HENT:  Nose: Nose normal.  Mouth/Throat: Oropharynx is clear and moist.  Eyes: Conjunctivae are normal. Right eye exhibits no discharge. Left eye exhibits no discharge.  Neck: Neck supple. No thyromegaly present.  Cardiovascular: Normal rate and regular rhythm.   Pulmonary/Chest: Breath sounds normal. No respiratory distress. She has no wheezes.  Abdominal: Soft. Bowel sounds are normal. There is no tenderness.  Musculoskeletal: She exhibits no edema or tenderness.  Lymphadenopathy:    She has no cervical adenopathy.  Skin: No rash noted. No erythema.  Psychiatric: She has a normal mood and affect. Her behavior is normal.    BP 100/50 mmHg  Pulse 86  Temp(Src) 98.4 F (36.9 C) (Oral)  Resp 18  Ht '5\' 4"'  (1.626 m)  Wt 142 lb 8 oz (64.638 kg)  BMI 24.45 kg/m2  SpO2 97% Wt Readings from Last 3 Encounters:  12/14/14 142 lb 8 oz (64.638 kg)  03/07/14 144 lb (65.318 kg)  12/13/13 154 lb (69.854 kg)     Lab Results  Component Value Date   WBC 6.4 12/07/2014   HGB 12.4 12/07/2014   HCT 37.8 12/07/2014   PLT 182.0 12/07/2014   GLUCOSE 135* 12/07/2014   CHOL 126 12/07/2014   TRIG 136.0 12/07/2014   HDL 39.30 12/07/2014   LDLCALC 59 12/07/2014   ALT 14 12/07/2014   AST 17 12/07/2014   NA 142 12/07/2014   K 4.3 12/07/2014   CL 106 12/07/2014   CREATININE 0.99 12/07/2014   BUN 13 12/07/2014   CO2 29 12/07/2014   TSH 0.77 12/07/2014   HGBA1C 7.3* 12/07/2014   MICROALBUR 1.8 05/24/2013    Mm Diag Breast Tomo Bilateral  12/12/2014  CLINICAL DATA:  81 year old female with history of right breast cancer post lumpectomy October 2014 followed by radiation therapy. EXAM: DIGITAL DIAGNOSTIC BILATERAL MAMMOGRAM WITH 3D TOMOSYNTHESIS AND CAD COMPARISON:  Previous exam(s). ACR Breast Density Category c: The breast tissue is heterogeneously dense, which may obscure small masses. FINDINGS: No suspicious  masses or calcifications are seen in either breast. Postsurgical changes are present in the central upper right breast related to prior lumpectomy. Spot compression magnification tangential view of the lumpectomy site in the right breast was performed. There is no mammographic evidence of locally recurrent malignancy. Mammographic images were processed with CAD. IMPRESSION: No mammographic evidence of malignancy in either breast. RECOMMENDATION: Diagnostic mammogram is suggested in 1 year. (Code:[id number]-B-01Y) I have discussed the findings and recommendations with the patient. Results were also provided in writing at the conclusion of the visit. If applicable, a reminder letter will be sent to the patient regarding the next appointment. BI-RADS CATEGORY  2:  Benign. Electronically Signed   By: Everlean Alstrom M.D.   On: 12/12/2014 14:17       Assessment & Plan:   Problem List Items Addressed This Visit    Breast cancer ([place])    Finished XRT.  On tamoxifen.  Followed by Dr Bary Castilla.       Relevant Medications   gabapentin (NEURONTIN) 100 MG capsule   Diabetes mellitus ([place])    Follow met b and a1c.  Tries to watch her diet.  Sugars have been doing well.  Brought in no recorded sugar readings.  Sees Dr Dawna Part for her eyes.        Relevant Orders   Hemoglobin A1c   Microalbumin / creatinine urine ratio   Environmental allergies    Controlled.       Hypercholesterolemia    Low cholesterol diet and exercise.  On lipitor.  Follow lipid panel and liver function tests.        Relevant Orders   Lipid panel   Hepatic function panel   Hypertension    Blood pressure under good control.  Continue same medication regimen.  Follow pressures.  Follow metabolic panel.        Relevant Orders   Basic metabolic panel   Left leg numbness    Sees Dr Manuella Ghazi.  On gabapentin.  Stable.        Melanoma of skin ([place])    Followed by dermatology and surgery.  Follow.        Relevant Medications   gabapentin  (NEURONTIN) 100 MG capsule   Myasthenia gravis ([place])    Seeing Dr Manuella Ghazi.  Stable.  Doing well.         Other Visit Diagnoses    Encounter for immunization    -  Primary        Einar Pheasant, MD

## 2014-12-14 NOTE — Patient Instructions (Signed)

## 2014-12-17 ENCOUNTER — Encounter: Payer: Self-pay | Admitting: Internal Medicine

## 2014-12-17 NOTE — Assessment & Plan Note (Signed)
Seeing Dr Manuella Ghazi.  Stable.  Doing well.

## 2014-12-17 NOTE — Assessment & Plan Note (Signed)
Finished XRT.  On tamoxifen.  Followed by Dr Bary Castilla.

## 2014-12-17 NOTE — Assessment & Plan Note (Signed)
Low cholesterol diet and exercise.  On lipitor.  Follow lipid panel and liver function tests.   

## 2014-12-17 NOTE — Assessment & Plan Note (Signed)
Controlled.  

## 2014-12-17 NOTE — Assessment & Plan Note (Signed)
Follow met b and a1c.  Tries to watch her diet.  Sugars have been doing well.  Brought in no recorded sugar readings.  Sees Dr Dawna Part for her eyes.

## 2014-12-17 NOTE — Assessment & Plan Note (Signed)
Followed by dermatology and surgery.  Follow.

## 2014-12-17 NOTE — Assessment & Plan Note (Signed)
Blood pressure under good control.  Continue same medication regimen.  Follow pressures.  Follow metabolic panel.   

## 2014-12-17 NOTE — Assessment & Plan Note (Signed)
Sees Dr Manuella Ghazi.  On gabapentin.  Stable.

## 2014-12-20 ENCOUNTER — Ambulatory Visit: Payer: Medicare Other | Admitting: General Surgery

## 2014-12-25 ENCOUNTER — Other Ambulatory Visit: Payer: Self-pay | Admitting: Internal Medicine

## 2014-12-27 ENCOUNTER — Ambulatory Visit (INDEPENDENT_AMBULATORY_CARE_PROVIDER_SITE_OTHER): Payer: Medicare Other | Admitting: General Surgery

## 2014-12-27 ENCOUNTER — Encounter: Payer: Self-pay | Admitting: General Surgery

## 2014-12-27 VITALS — BP 130/70 | HR 76 | Resp 12 | Ht 64.0 in | Wt 141.0 lb

## 2014-12-27 DIAGNOSIS — D0511 Intraductal carcinoma in situ of right breast: Secondary | ICD-10-CM

## 2014-12-27 NOTE — Progress Notes (Signed)
Patient ID: Cynthia Cynthia, female   DOB: 1931-12-29, 81 y.o.   MRN: [id number]  Chief Complaint  Patient presents with  . Follow-up    mammogram    HPI Cynthia Cynthia is a 81 y.o. female who presents for a breast evaluation. The most recent mammogram was done on 12/12/14.  Patient does perform regular self breast checks and gets regular mammograms done.    HPI  Past Medical History  Diagnosis Date  . Diabetes mellitus ([place])   . Hypertension   . Hypercholesterolemia   . Allergic rhinitis   . Myasthenia gravis ([place])     ocular  . S/P radiation therapy 2015    BREAST CA  . Cancer ([place]) 2014    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 Resected margins negative. Whole breast radiation  . Cancer ([place]) 12-15-12    left upper arm, malignant melanoma Clark level II, Breslow thickness: 0.35 mm.  . Breast cancer ([place]) 2014    RT LUMPECTOMY    Past Surgical History  Procedure Laterality Date  . Eye surgery      Caterac both eyes  . Breast surgery Right 2014    lumpectomy  . Breast biopsy Right 2014    DCIS    Family History  Problem Relation Age of Onset  . Skin cancer Father   . Cancer Father     colon  . Colon cancer Brother   . Cancer Brother     colon  . Breast cancer      niece    Social History Social History  Substance Use Topics  . Smoking status: Never Smoker   . Smokeless tobacco: Never Used  . Alcohol Use: No    Allergies  Allergen Reactions  . No Known Drug Allergy     Current Outpatient Prescriptions  Medication Sig Dispense Refill  . ACCU-CHEK SOFTCLIX LANCETS lancets USE TWICE A DAY 100 each 3  . aspirin 81 MG tablet Take 81 mg by mouth daily.    Marland Kitchen atorvastatin (LIPITOR) 20 MG tablet TAKE 1 TABLET EVERY DAY 90 tablet 1  . Calcium Carbonate-Vitamin D (CALCIUM 600+D) 600-400 MG-UNIT per tablet Take 1 tablet by mouth 2 (two) times daily.    Marland Kitchen gabapentin (NEURONTIN) 100 MG capsule Take 100 mg by mouth at bedtime.    Marland Kitchen glucose blood (ACCU-CHEK  AVIVA PLUS) test strip USE TO CHECK BLOOD SUGAR TWICE A DAY. DX. E11.9 200 each 3  . hydrochlorothiazide (HYDRODIURIL) 25 MG tablet TAKE 1 TABLET EVERY DAY 90 tablet 1  . potassium chloride (K-DUR,KLOR-CON) 10 MEQ tablet TAKE 1 TABLET EVERY DAY 90 tablet 1  . ramipril (ALTACE) 10 MG capsule TAKE 1 CAPSULE EVERY DAY 90 capsule 1  . tamoxifen (NOLVADEX) 20 MG tablet TAKE 1 TABLET EVERY DAY 90 tablet 3   No current facility-administered medications for this visit.    Review of Systems Review of Systems  Constitutional: Negative.   Respiratory: Negative.   Cardiovascular: Negative.     Blood pressure 130/70, pulse 76, resp. rate 12, height 5\' 4"  (1.626 m), weight 141 lb (63.957 kg).  Physical Exam Physical Exam  Constitutional: She is oriented to person, place, and time. She appears well-developed and well-nourished.  Eyes: Conjunctivae are normal. No scleral icterus.  Neck: Neck supple.  Cardiovascular: Normal rate and regular rhythm.   Murmur heard.  Systolic murmur is present with a grade of 2/6  Pulmonary/Chest: Effort normal and breath sounds normal. Right breast exhibits no  inverted nipple, no mass, no nipple discharge, no skin change and no tenderness. Left breast exhibits no inverted nipple, no mass, no nipple discharge, no skin change and no tenderness.    Right breast firmness below incision from 12 to 2.    Musculoskeletal:       Arms: Lymphadenopathy:    She has no cervical adenopathy.    She has no axillary adenopathy.  Neurological: She is alert and oriented to person, place, and time.  Skin: Skin is dry.  Left postrenal incision  well healed above elbow.    Data Reviewed 12/12/2014, bilateral mammograms reviewed. No interval change. BI-RADS-2.  Assessment    Benign breast exam.  Continued good tolerance of tamoxifen.     Plan        The patient has been asked to return to the office in one year with a bilateral diagnostic mammogram. PCP:  Cynthia Cynthia 12/27/2014, 3:12 PM

## 2014-12-27 NOTE — Patient Instructions (Signed)
The patient has been asked to return to the office in one year with a bilateral diagnostic mammogram. 

## 2015-02-02 ENCOUNTER — Other Ambulatory Visit: Payer: Self-pay | Admitting: Internal Medicine

## 2015-02-02 ENCOUNTER — Other Ambulatory Visit: Payer: Self-pay | Admitting: General Surgery

## 2015-02-06 ENCOUNTER — Telehealth: Payer: Self-pay | Admitting: Internal Medicine

## 2015-02-06 NOTE — Telephone Encounter (Signed)
Pt called about [employer] calling pt stating two prescriptions that was faxed. Pt got the call yesterday. Medication is potassium chloride (K-DUR,KLOR-CON) 10 MEQ tablet and hydrochlorothiazide (HYDRODIURIL) 25 MG tablet. Pharmacy is [facility], [place]. [phone] (Fax) Thank You!

## 2015-02-06 NOTE — Telephone Encounter (Signed)
Ok. I called the pt and she said ok. Thank you!

## 2015-02-06 NOTE — Telephone Encounter (Signed)
Both of these prescriptions were sent yesterday to the pharmacy.  Can you call the patient and let them know? thanks

## 2015-04-17 ENCOUNTER — Other Ambulatory Visit: Payer: Self-pay | Admitting: Internal Medicine

## 2015-04-18 ENCOUNTER — Other Ambulatory Visit (INDEPENDENT_AMBULATORY_CARE_PROVIDER_SITE_OTHER): Payer: Medicare Other

## 2015-04-18 ENCOUNTER — Other Ambulatory Visit: Payer: No Typology Code available for payment source

## 2015-04-18 DIAGNOSIS — I10 Essential (primary) hypertension: Secondary | ICD-10-CM | POA: Diagnosis not present

## 2015-04-18 DIAGNOSIS — E119 Type 2 diabetes mellitus without complications: Secondary | ICD-10-CM | POA: Diagnosis not present

## 2015-04-18 DIAGNOSIS — E7800 Pure hypercholesterolemia, unspecified: Secondary | ICD-10-CM | POA: Diagnosis not present

## 2015-04-18 LAB — BASIC METABOLIC PANEL
BUN: 14 mg/dL (ref 6–23)
CALCIUM: 9.4 mg/dL (ref 8.4–10.5)
CO2: 29 mEq/L (ref 19–32)
CREATININE: 0.95 mg/dL (ref 0.40–1.20)
Chloride: 105 mEq/L (ref 96–112)
GFR: 59.94 mL/min — AB (ref >60.00)
GLUCOSE: 124 mg/dL — AB (ref 70–99)
Potassium: 4.1 mEq/L (ref 3.5–5.1)
SODIUM: 140 meq/L (ref 135–145)

## 2015-04-18 LAB — MICROALBUMIN / CREATININE URINE RATIO
Creatinine,U: 194.2 mg/dL
MICROALB/CREAT RATIO: 0.4 mg/g (ref 0.0–30.0)
Microalb, Ur: 0.8 mg/dL (ref 0.0–1.9)

## 2015-04-18 LAB — LIPID PANEL
CHOLESTEROL: 134 mg/dL (ref 0–200)
HDL: 42.9 mg/dL (ref >39.00)
LDL CALC: 65 mg/dL (ref 0–99)
NonHDL: 91.25
TRIGLYCERIDES: 130 mg/dL (ref 0.0–149.0)
Total CHOL/HDL Ratio: 3
VLDL: 26 mg/dL (ref 0.0–40.0)

## 2015-04-18 LAB — HEPATIC FUNCTION PANEL
ALT: 10 U/L (ref 0–35)
AST: 14 U/L (ref 0–37)
Albumin: 3.8 g/dL (ref 3.5–5.2)
Alkaline Phosphatase: 55 U/L (ref 39–117)
BILIRUBIN DIRECT: 0.1 mg/dL (ref 0.0–0.3)
TOTAL PROTEIN: 6.5 g/dL (ref 6.0–8.3)
Total Bilirubin: 0.4 mg/dL (ref 0.2–1.2)

## 2015-04-18 LAB — HEMOGLOBIN A1C: Hgb A1c MFr Bld: 7.5 % — ABNORMAL HIGH (ref 4.6–6.5)

## 2015-04-19 ENCOUNTER — Encounter: Payer: Self-pay | Admitting: *Deleted

## 2015-04-20 ENCOUNTER — Encounter: Payer: No Typology Code available for payment source | Admitting: Internal Medicine

## 2015-05-07 ENCOUNTER — Encounter: Payer: No Typology Code available for payment source | Admitting: Internal Medicine

## 2015-06-07 ENCOUNTER — Ambulatory Visit (INDEPENDENT_AMBULATORY_CARE_PROVIDER_SITE_OTHER): Payer: Medicare Other | Admitting: Internal Medicine

## 2015-06-07 ENCOUNTER — Encounter: Payer: Self-pay | Admitting: Internal Medicine

## 2015-06-07 VITALS — BP 143/66 | HR 70 | Temp 98.2°F | Ht 64.0 in | Wt 141.1 lb

## 2015-06-07 DIAGNOSIS — C439 Malignant melanoma of skin, unspecified: Secondary | ICD-10-CM

## 2015-06-07 DIAGNOSIS — M79605 Pain in left leg: Secondary | ICD-10-CM

## 2015-06-07 DIAGNOSIS — I10 Essential (primary) hypertension: Secondary | ICD-10-CM | POA: Diagnosis not present

## 2015-06-07 DIAGNOSIS — G7000 Myasthenia gravis without (acute) exacerbation: Secondary | ICD-10-CM | POA: Diagnosis not present

## 2015-06-07 DIAGNOSIS — E7800 Pure hypercholesterolemia, unspecified: Secondary | ICD-10-CM

## 2015-06-07 DIAGNOSIS — E119 Type 2 diabetes mellitus without complications: Secondary | ICD-10-CM

## 2015-06-07 DIAGNOSIS — C50911 Malignant neoplasm of unspecified site of right female breast: Secondary | ICD-10-CM

## 2015-06-07 NOTE — Progress Notes (Signed)
Patient ID: Cynthia [name], female   DOB: 25-Dec-1931, 82 y.o.   MRN: [id number]   Subjective:    Patient ID: Cynthia [name], female    DOB: 10/16/1931, 82 y.o.   MRN: [id number]  HPI  Patient with past history of diabetes, hypercholesterolemia, myasthenia gravis, breast cancer and hypertension.  She comes in today to follow up on these issues.  Her main complaint is that of left hip and left leg pain.  Pain starts in her lower left buttock and left hip and extends to her left foot.  Has been present for several months.  Worsened lately.  Hurts to walk, especially when she first gets up.  Does not hurt when sitting.  Better once moves around more.  Hat been taking one tylenol arthritis per day prn.  Does help.  States blood sugars in am averaging 130s and pm sugars 160s.  No chest pain.  Breathing stable.  No nausea or vomiting.  Bowels stable.  Also has a left anterior chest lesion.  Discussed dermatology referral.  She wants to hold on referral.     Past Medical History  Diagnosis Date  . Diabetes mellitus ([place])   . Hypertension   . Hypercholesterolemia   . Allergic rhinitis   . 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 S/P radiation therapy 2015    BREAST CA  . 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 Resected margins negative. Whole breast radiation  . Cancer ([place]) 12-15-12    left upper arm, malignant melanoma Clark level II, Breslow thickness: 0.35 mm.  . Breast cancer ([place]) 2014    RT LUMPECTOMY   Past Surgical History  Procedure Laterality Date  . Eye surgery      Caterac both eyes  . Breast surgery Right 2014    lumpectomy  . Breast biopsy Right 2014    DCIS   Family History  Problem Relation Age of Onset  . Skin cancer Father   . Cancer Father     colon  . Colon cancer Brother   . Cancer Brother     colon  . Breast cancer      niece   Social History   Social History  . Marital Status: Divorced    Spouse Name: N/A  . Number of Children: N/A   . Years of Education: N/A   Social History Main Topics  . Smoking status: Never Smoker   . Smokeless tobacco: Never Used  . Alcohol Use: No  . Drug Use: No  . Sexual Activity: No   Other Topics Concern  . None   Social History Narrative    Outpatient Encounter Prescriptions as of 06/07/2015  Medication Sig  . ACCU-CHEK SOFTCLIX LANCETS lancets USE TWICE A DAY  . aspirin 81 MG tablet Take 81 mg by mouth daily.  Marland Kitchen atorvastatin (LIPITOR) 20 MG tablet TAKE 1 TABLET EVERY DAY  . Calcium Carbonate-Vitamin D (CALCIUM 600+D) 600-400 MG-UNIT per tablet Take 1 tablet by mouth 2 (two) times daily.  Marland Kitchen gabapentin (NEURONTIN) 100 MG capsule Take 100 mg by mouth at bedtime.  Marland Kitchen glucose blood (ACCU-CHEK AVIVA PLUS) test strip USE TO CHECK BLOOD SUGAR TWICE A DAY. DX. E11.9  . hydrochlorothiazide (HYDRODIURIL) 25 MG tablet TAKE 1 TABLET EVERY DAY  . potassium chloride (K-DUR,KLOR-CON) 10 MEQ tablet TAKE 1 TABLET EVERY DAY  . ramipril (ALTACE) 10 MG capsule TAKE 1 CAPSULE EVERY DAY  . tamoxifen (NOLVADEX)  20 MG tablet TAKE 1 TABLET EVERY DAY   No facility-administered encounter medications on file as of 06/07/2015.    Review of Systems  Constitutional: Negative for appetite change and unexpected weight change.  HENT: Negative for congestion and sinus pressure.   Eyes: Negative for pain and visual disturbance.  Respiratory: Negative for cough, chest tightness and shortness of breath.   Cardiovascular: Negative for chest pain, palpitations and leg swelling.  Gastrointestinal: Negative for nausea, vomiting, abdominal pain and diarrhea.  Genitourinary: Negative for dysuria and difficulty urinating.  Musculoskeletal: Negative for back pain and joint swelling.  Skin: Negative for color change and rash.  Neurological: Negative for dizziness, light-headedness and headaches.  Hematological: Negative for adenopathy. Does not bruise/bleed easily.  Psychiatric/Behavioral: Negative for dysphoric mood and  agitation.       Objective:     Blood pressure rechecked by me:  138-140/68  Physical Exam  Constitutional: She is oriented to person, place, and time. She appears well-developed and well-nourished. No distress.  HENT:  Nose: Nose normal.  Mouth/Throat: Oropharynx is clear and moist.  Eyes: Right eye exhibits no discharge. Left eye exhibits no discharge. No scleral icterus.  Neck: Neck supple. No thyromegaly present.  Cardiovascular: Normal rate and regular rhythm.   Pulmonary/Chest: Breath sounds normal. No accessory muscle usage. No tachypnea. No respiratory distress. She has no decreased breath sounds. She has no wheezes. She has no rhonchi. Right breast exhibits no inverted nipple, no mass, no nipple discharge and no tenderness (no axillary adenopathy). Left breast exhibits no inverted nipple, no mass, no nipple discharge and no tenderness (no axilarry adenopathy).  Abdominal: Soft. Bowel sounds are normal. There is no tenderness.  Musculoskeletal: She exhibits no edema or tenderness.  Lymphadenopathy:    She has no cervical adenopathy.  Neurological: She is alert and oriented to person, place, and time.  Skin: Skin is warm. No rash noted. No erythema.  Left anterior chest lesion.   Psychiatric: She has a normal mood and affect. Her behavior is normal.    BP 143/66 mmHg  Pulse 70  Temp(Src) 98.2 F (36.8 C) (Oral)  Ht '5\' 4"'  (1.626 m)  Wt 141 lb 2 oz (64.014 kg)  BMI 24.21 kg/m2  SpO2 96% Wt Readings from Last 3 Encounters:  06/07/15 141 lb 2 oz (64.014 kg)  12/27/14 141 lb (63.957 kg)  12/14/14 142 lb 8 oz (64.638 kg)     Lab Results  Component Value Date   WBC 6.4 12/07/2014   HGB 12.4 12/07/2014   HCT 37.8 12/07/2014   PLT 182.0 12/07/2014   GLUCOSE 124* 04/18/2015   CHOL 134 04/18/2015   TRIG 130.0 04/18/2015   HDL 42.90 04/18/2015   LDLCALC 65 04/18/2015   ALT 10 04/18/2015   AST 14 04/18/2015   NA 140 04/18/2015   K 4.1 04/18/2015   CL 105 04/18/2015     CREATININE 0.95 04/18/2015   BUN 14 04/18/2015   CO2 29 04/18/2015   TSH 0.77 12/07/2014   HGBA1C 7.5* 04/18/2015   MICROALBUR 0.8 04/18/2015    Mm Diag Breast Tomo Bilateral  12/12/2014  CLINICAL DATA:  82 year old female with history of right breast cancer post lumpectomy October 2014 followed by radiation therapy. EXAM: DIGITAL DIAGNOSTIC BILATERAL MAMMOGRAM WITH 3D TOMOSYNTHESIS AND CAD COMPARISON:  Previous exam(s). ACR Breast Density Category c: The breast tissue is heterogeneously dense, which may obscure small masses. FINDINGS: No suspicious masses or calcifications are seen in either breast. Postsurgical changes are present  in the central upper right breast related to prior lumpectomy. Spot compression magnification tangential view of the lumpectomy site in the right breast was performed. There is no mammographic evidence of locally recurrent malignancy. Mammographic images were processed with CAD. IMPRESSION: No mammographic evidence of malignancy in either breast. RECOMMENDATION: Diagnostic mammogram is suggested in 1 year. (Code:DM-B-01Y) I have discussed the findings and recommendations with the patient. Results were also provided in writing at the conclusion of the visit. If applicable, a reminder letter will be sent to the patient regarding the next appointment. BI-RADS CATEGORY  2: Benign. Electronically Signed   By: Everlean Alstrom M.D.   On: 12/12/2014 14:17       Assessment & Plan:   Problem List Items Addressed This Visit    Breast cancer [facility])    Seeing Dr Bary Castilla.  Mammogram 12/12/14 - Birads II.  On tamoxifen.  Follow.        Diabetes mellitus ([place])    Sugars as outlined.  Low carb diet.  Follow met b and a1c.        Relevant Orders   Hemoglobin B1Y   Basic metabolic panel   Hypercholesterolemia    On lipitor.  Follow lipid panel and liver function tests.        Relevant Orders   Lipid panel   Hepatic function panel   Hypertension    Blood pressure as  outlined.  Same medication regimen.  Follow pressures. Follow metabolic panel.        Left leg pain - Primary    Left lower back, hip and leg pain.  No pain with straight leg raise.  No pain with abduction and adduction of his leg.  Check L-S spine xray and hip xray.  Tylenol arthritis as directed.  Follow.  May need ortho referral.        Relevant Orders   DG Lumbar Spine Complete   DG HIP UNILAT WITH PELVIS 2-3 VIEWS LEFT   Melanoma of skin ([place])    Followed by dermatology and surgery.  Has anterior chest lesion.  Wants to hold on referral.  Will notify me when agreeable.        Myasthenia gravis ([place])    Followed by neurology.  Sees Dr Manuella Ghazi.  Stable.  Doing well.            Einar Pheasant, MD

## 2015-06-07 NOTE — Progress Notes (Signed)
Pre visit review using our clinic review tool, if applicable. No additional management support is needed unless otherwise documented below in the visit note. 

## 2015-06-09 ENCOUNTER — Encounter: Payer: Self-pay | Admitting: Internal Medicine

## 2015-06-09 DIAGNOSIS — C50919 Malignant neoplasm of unspecified site of unspecified female breast: Secondary | ICD-10-CM | POA: Insufficient documentation

## 2015-06-09 NOTE — Assessment & Plan Note (Signed)
Left lower back, hip and leg pain.  No pain with straight leg raise.  No pain with abduction and adduction of his leg.  Check L-S spine xray and hip xray.  Tylenol arthritis as directed.  Follow.  May need ortho referral.

## 2015-06-09 NOTE — Assessment & Plan Note (Signed)
On lipitor.  Follow lipid panel and liver function tests.   

## 2015-06-09 NOTE — Assessment & Plan Note (Signed)
Followed by neurology.  Sees Dr Manuella Ghazi.  Stable.  Doing well.

## 2015-06-09 NOTE — Assessment & Plan Note (Signed)
Followed by dermatology and surgery.  Has anterior chest lesion.  Wants to hold on referral.  Will notify me when agreeable.

## 2015-06-09 NOTE — Assessment & Plan Note (Signed)
Blood pressure as outlined.  Same medication regimen.  Follow pressures.  Follow metabolic panel.  

## 2015-06-09 NOTE — Assessment & Plan Note (Signed)
Sugars as outlined.  Low carb diet.  Follow met b and a1c.

## 2015-06-09 NOTE — Assessment & Plan Note (Signed)
Seeing Dr Bary Castilla.  Mammogram 12/12/14 - Birads II.  On tamoxifen.  Follow.

## 2015-06-11 ENCOUNTER — Ambulatory Visit
Admission: RE | Admit: 2015-06-11 | Discharge: 2015-06-11 | Disposition: A | Discharge status: Home / Self Care | Payer: Medicare Other | Source: Ambulatory Visit | Attending: Internal Medicine | Admitting: Internal Medicine

## 2015-06-11 DIAGNOSIS — M25552 Pain in left hip: Secondary | ICD-10-CM | POA: Diagnosis not present

## 2015-06-11 DIAGNOSIS — M79605 Pain in left leg: Secondary | ICD-10-CM | POA: Insufficient documentation

## 2015-06-11 DIAGNOSIS — M5136 Other intervertebral disc degeneration, lumbar region: Secondary | ICD-10-CM | POA: Insufficient documentation

## 2015-06-13 ENCOUNTER — Telehealth: Payer: Self-pay | Admitting: Internal Medicine

## 2015-06-13 DIAGNOSIS — M79605 Pain in left leg: Secondary | ICD-10-CM

## 2015-06-13 NOTE — Telephone Encounter (Signed)
Please advise for ortho referral, thanks

## 2015-06-13 NOTE — Telephone Encounter (Signed)
Patient requesting referral to Dr. Marry Guan for orthopedics.

## 2015-06-14 NOTE — Telephone Encounter (Signed)
Order placed for ortho referral.   

## 2015-07-02 ENCOUNTER — Other Ambulatory Visit: Payer: Self-pay | Admitting: Internal Medicine

## 2015-07-02 NOTE — Telephone Encounter (Signed)
Refilled on 11/2014 and last seen on 06/07/15. Please advise?

## 2015-07-02 NOTE — Telephone Encounter (Signed)
ok'd refill kcl #[phone] with one refill.

## 2015-07-17 DIAGNOSIS — M5442 Lumbago with sciatica, left side: Secondary | ICD-10-CM | POA: Diagnosis not present

## 2015-07-17 DIAGNOSIS — M4806 Spinal stenosis, lumbar region: Secondary | ICD-10-CM | POA: Diagnosis not present

## 2015-07-18 ENCOUNTER — Other Ambulatory Visit: Payer: Self-pay | Admitting: Physician Assistant

## 2015-07-18 DIAGNOSIS — M48061 Spinal stenosis, lumbar region without neurogenic claudication: Secondary | ICD-10-CM

## 2015-07-19 ENCOUNTER — Other Ambulatory Visit (INDEPENDENT_AMBULATORY_CARE_PROVIDER_SITE_OTHER): Payer: Medicare Other

## 2015-07-19 DIAGNOSIS — E119 Type 2 diabetes mellitus without complications: Secondary | ICD-10-CM | POA: Diagnosis not present

## 2015-07-19 DIAGNOSIS — E7800 Pure hypercholesterolemia, unspecified: Secondary | ICD-10-CM | POA: Diagnosis not present

## 2015-07-19 LAB — HEPATIC FUNCTION PANEL
ALT: 11 U/L (ref 0–35)
AST: 17 U/L (ref 0–37)
Albumin: 3.8 g/dL (ref 3.5–5.2)
Alkaline Phosphatase: 47 U/L (ref 39–117)
BILIRUBIN TOTAL: 0.5 mg/dL (ref 0.2–1.2)
Bilirubin, Direct: 0.1 mg/dL (ref 0.0–0.3)
Total Protein: 6.6 g/dL (ref 6.0–8.3)

## 2015-07-19 LAB — LIPID PANEL
Cholesterol: 121 mg/dL (ref 0–200)
HDL: 34.5 mg/dL — ABNORMAL LOW (ref >39.00)
LDL Cholesterol: 58 mg/dL (ref 0–99)
NONHDL: 86.15
Total CHOL/HDL Ratio: 3
Triglycerides: 142 mg/dL (ref 0.0–149.0)
VLDL: 28.4 mg/dL (ref 0.0–40.0)

## 2015-07-19 LAB — BASIC METABOLIC PANEL
BUN: 12 mg/dL (ref 6–23)
CHLORIDE: 105 meq/L (ref 96–112)
CO2: 30 meq/L (ref 19–32)
CREATININE: 1 mg/dL (ref 0.40–1.20)
Calcium: 9.9 mg/dL (ref 8.4–10.5)
GFR: 56.46 mL/min — ABNORMAL LOW (ref >60.00)
Glucose, Bld: 138 mg/dL — ABNORMAL HIGH (ref 70–99)
Potassium: 4.2 mEq/L (ref 3.5–5.1)
Sodium: 139 mEq/L (ref 135–145)

## 2015-07-19 LAB — HEMOGLOBIN A1C: HEMOGLOBIN A1C: 7.4 % — AB (ref 4.6–6.5)

## 2015-08-09 ENCOUNTER — Ambulatory Visit
Admission: RE | Admit: 2015-08-09 | Discharge: 2015-08-09 | Disposition: A | Discharge status: Home / Self Care | Payer: Medicare Other | Source: Ambulatory Visit | Attending: Physician Assistant | Admitting: Physician Assistant

## 2015-08-09 DIAGNOSIS — M48061 Spinal stenosis, lumbar region without neurogenic claudication: Secondary | ICD-10-CM

## 2015-08-09 DIAGNOSIS — M4806 Spinal stenosis, lumbar region: Secondary | ICD-10-CM | POA: Insufficient documentation

## 2015-08-09 DIAGNOSIS — M5126 Other intervertebral disc displacement, lumbar region: Secondary | ICD-10-CM | POA: Insufficient documentation

## 2015-08-22 ENCOUNTER — Other Ambulatory Visit: Payer: Self-pay | Admitting: Internal Medicine

## 2015-08-22 ENCOUNTER — Telehealth: Payer: Self-pay | Admitting: *Deleted

## 2015-08-22 MED ORDER — TRIAMCINOLONE ACETONIDE 0.1 % EX CREA: 1.0000 "application " | TOPICAL_CREAM | Freq: Two times a day (BID) | CUTANEOUS | Status: DC

## 2015-08-22 NOTE — Telephone Encounter (Signed)
Please advise, or does patient need to be seen?

## 2015-08-22 NOTE — Telephone Encounter (Signed)
Patient has requested to have a Rx sent in triamcinolone acetonide  She stated that she's having poison oak symptoms. Pharmacy [facility] [address]

## 2015-08-22 NOTE — Telephone Encounter (Signed)
I sent in a rx for Triamcinolone cream.  Please notify pt not to use on her face, breasts or genitals.  If persistent problems despite the cream, will need to be seen.

## 2015-08-22 NOTE — Progress Notes (Signed)
rx sent in for TCC cream 

## 2015-08-23 DIAGNOSIS — G629 Polyneuropathy, unspecified: Secondary | ICD-10-CM | POA: Diagnosis not present

## 2015-08-23 DIAGNOSIS — G7000 Myasthenia gravis without (acute) exacerbation: Secondary | ICD-10-CM | POA: Diagnosis not present

## 2015-08-23 NOTE — Telephone Encounter (Signed)
Attempted to reach patient, left VM.  

## 2015-08-23 NOTE — Telephone Encounter (Signed)
Pt.notified

## 2015-09-26 ENCOUNTER — Other Ambulatory Visit: Payer: Self-pay | Admitting: Internal Medicine

## 2015-10-09 ENCOUNTER — Encounter: Payer: Self-pay | Admitting: Internal Medicine

## 2015-10-09 ENCOUNTER — Ambulatory Visit (INDEPENDENT_AMBULATORY_CARE_PROVIDER_SITE_OTHER): Payer: Medicare Other | Admitting: Internal Medicine

## 2015-10-09 DIAGNOSIS — M79605 Pain in left leg: Secondary | ICD-10-CM

## 2015-10-09 DIAGNOSIS — E7800 Pure hypercholesterolemia, unspecified: Secondary | ICD-10-CM

## 2015-10-09 DIAGNOSIS — C50911 Malignant neoplasm of unspecified site of right female breast: Secondary | ICD-10-CM | POA: Diagnosis not present

## 2015-10-09 DIAGNOSIS — G7000 Myasthenia gravis without (acute) exacerbation: Secondary | ICD-10-CM

## 2015-10-09 DIAGNOSIS — I10 Essential (primary) hypertension: Secondary | ICD-10-CM | POA: Diagnosis not present

## 2015-10-09 DIAGNOSIS — E119 Type 2 diabetes mellitus without complications: Secondary | ICD-10-CM

## 2015-10-09 LAB — BASIC METABOLIC PANEL
BUN: 14 mg/dL (ref 6–23)
CO2: 30 meq/L (ref 19–32)
Calcium: 9.7 mg/dL (ref 8.4–10.5)
Chloride: 104 mEq/L (ref 96–112)
Creatinine, Ser: 1.08 mg/dL (ref 0.40–1.20)
GFR: 51.63 mL/min — AB (ref >60.00)
GLUCOSE: 119 mg/dL — AB (ref 70–99)
POTASSIUM: 3.8 meq/L (ref 3.5–5.1)
SODIUM: 139 meq/L (ref 135–145)

## 2015-10-09 LAB — HEPATIC FUNCTION PANEL
ALK PHOS: 49 U/L (ref 39–117)
ALT: 11 U/L (ref 0–35)
AST: 16 U/L (ref 0–37)
Albumin: 3.7 g/dL (ref 3.5–5.2)
BILIRUBIN DIRECT: 0.2 mg/dL (ref 0.0–0.3)
BILIRUBIN TOTAL: 0.5 mg/dL (ref 0.2–1.2)
TOTAL PROTEIN: 6.7 g/dL (ref 6.0–8.3)

## 2015-10-09 LAB — CBC WITH DIFFERENTIAL/PLATELET
BASOS ABS: 0 10*3/uL (ref 0.0–0.1)
BASOS PCT: 0.4 % (ref 0.0–3.0)
EOS ABS: 0.2 10*3/uL (ref 0.0–0.7)
EOS PCT: 2.8 % (ref 0.0–5.0)
HCT: 36.8 % (ref 36.0–46.0)
Hemoglobin: 12 g/dL (ref 12.0–15.0)
LYMPHS PCT: 29 % (ref 12.0–46.0)
Lymphs Abs: 1.9 10*3/uL (ref 0.7–4.0)
MCHC: 32.7 g/dL (ref 30.0–36.0)
MCV: 85.2 fl (ref 78.0–100.0)
MONOS PCT: 6.9 % (ref 3.0–12.0)
Monocytes Absolute: 0.5 10*3/uL (ref 0.1–1.0)
NEUTROS ABS: 4 10*3/uL (ref 1.4–7.7)
NEUTROS PCT: 60.9 % (ref 43.0–77.0)
PLATELETS: 207 10*3/uL (ref 150.0–400.0)
RBC: 4.32 Mil/uL (ref 3.87–5.11)
RDW: 13.6 % (ref 11.5–15.5)
WBC: 6.5 10*3/uL (ref 4.0–10.5)

## 2015-10-09 LAB — LIPID PANEL
CHOL/HDL RATIO: 3
CHOLESTEROL: 131 mg/dL (ref 0–200)
HDL: 44.1 mg/dL (ref >39.00)
LDL CALC: 60 mg/dL (ref 0–99)
NonHDL: 87.27
TRIGLYCERIDES: 137 mg/dL (ref 0.0–149.0)
VLDL: 27.4 mg/dL (ref 0.0–40.0)

## 2015-10-09 LAB — TSH: TSH: 0.95 u[IU]/mL (ref 0.35–4.50)

## 2015-10-09 LAB — HEMOGLOBIN A1C: Hgb A1c MFr Bld: 7.3 % — ABNORMAL HIGH (ref 4.6–6.5)

## 2015-10-09 NOTE — Progress Notes (Signed)
Pre-visit discussion using our clinic review tool. No additional management support is needed unless otherwise documented below in the visit note.  

## 2015-10-09 NOTE — Assessment & Plan Note (Signed)
Blood pressure under good control.  Continue same medication regimen.  Follow pressures.  Follow metabolic panel.   

## 2015-10-09 NOTE — Assessment & Plan Note (Signed)
States her sugars are doing better.  120s mostly in am.  168 highest.  Check met b and a1c.

## 2015-10-09 NOTE — Assessment & Plan Note (Signed)
Off mestinon and MTX.  Doing well.  Continue f/u with Dr Manuella Ghazi.

## 2015-10-09 NOTE — Assessment & Plan Note (Signed)
On tamoxifen.  Seeing Dr Bary Castilla.  Mammogram 12/12/14- Birads II.  Has f/u scheduled in 11/2015.

## 2015-10-09 NOTE — Progress Notes (Signed)
Patient ID: Karlene Lineman, female   DOB: [birth year], 82 y.o.   MRN: [id number]   Subjective:    Patient ID: Karlene Lineman, female    DOB: [birth year], 82 y.o.   MRN: [id number]  HPI  Patient here for a scheduled follow up.  Seeing Dr Manuella Ghazi for ocular myasthenia gravis.  Off mestinon.  She was having issues with peripheral neuropathy - numbness in left foot and toes.  On gabapentin.  Does not have any pain.  Wants to come off the gabapentin and see how she does.  No chest pain.  No sob.  No acid reflux.  No nausea or vomiting.  Bowels stable.  Has lost weight.  Discussed with her today.  Back and leg are better.  Saw ortho.  Saw Dr Sharlet Salina.  Pain better at his appt.     Past Medical History:  Diagnosis Date  . Allergic rhinitis   . Breast cancer ([place]) 2014   RT LUMPECTOMY  . 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 Resected margins negative. Whole breast radiation  . Cancer ([place]) 12-15-12   left upper arm, malignant melanoma Clark level II, Breslow thickness: 0.35 mm.  . Diabetes mellitus ([place])   . Hypercholesterolemia   . Hypertension   . Myasthenia gravis ([place])    ocular  . S/P radiation therapy 2015   BREAST CA   Past Surgical History:  Procedure Laterality Date  . BREAST BIOPSY Right 2014   DCIS  . BREAST SURGERY Right 2014   lumpectomy  . EYE SURGERY     Caterac both eyes   Family History  Problem Relation Age of Onset  . Skin cancer Father   . Cancer Father     colon  . Colon cancer Brother   . Cancer Brother     colon  . Breast cancer      niece   Social History   Social History  . Marital status: Divorced    Spouse name: N/A  . Number of children: N/A  . Years of education: N/A   Social History Main Topics  . Smoking status: Never Smoker  . Smokeless tobacco: Never Used  . Alcohol use No  . Drug use: No  . Sexual activity: No   Other Topics Concern  . None   Social History Narrative  . None    Outpatient Encounter  Prescriptions as of 10/09/2015  Medication Sig  . ACCU-CHEK AVIVA PLUS test strip USE TO CHECK BLOOD SUGAR TWICE A DAY. DX. E11.9  . ACCU-CHEK SOFTCLIX LANCETS lancets USE TWICE A DAY  . aspirin 81 MG tablet Take 81 mg by mouth daily.  Marland Kitchen atorvastatin (LIPITOR) 20 MG tablet TAKE 1 TABLET EVERY DAY  . Calcium Carbonate-Vitamin D (CALCIUM 600+D) 600-400 MG-UNIT per tablet Take 1 tablet by mouth 2 (two) times daily.  Marland Kitchen gabapentin (NEURONTIN) 100 MG capsule Take 100 mg by mouth at bedtime.  . hydrochlorothiazide (HYDRODIURIL) 25 MG tablet TAKE 1 TABLET EVERY DAY  . potassium chloride (K-DUR) 10 MEQ tablet TAKE 1 TABLET EVERY DAY  . ramipril (ALTACE) 10 MG capsule TAKE 1 CAPSULE EVERY DAY  . tamoxifen (NOLVADEX) 20 MG tablet TAKE 1 TABLET EVERY DAY  . triamcinolone cream (KENALOG) 0.1 % Apply 1 application topically 2 (two) times daily.   No facility-administered encounter medications on file as of 10/09/2015.     Review of Systems  Constitutional: Negative for appetite change.  Weight is down.    HENT: Negative for congestion and sinus pressure.   Respiratory: Negative for cough, chest tightness and shortness of breath.   Cardiovascular: Negative for chest pain, palpitations and leg swelling.  Gastrointestinal: Negative for abdominal pain, diarrhea, nausea and vomiting.  Genitourinary: Negative for difficulty urinating and dysuria.  Musculoskeletal: Negative for myalgias.       Back and leg doing better.   Skin: Negative for color change and rash.  Neurological: Negative for dizziness, light-headedness and headaches.       Numbness in her feet.  No pain.   Psychiatric/Behavioral: Negative for agitation and dysphoric mood.       Objective:     Blood pressure rechecked by me:  132/76  Physical Exam  Constitutional: She appears well-developed and well-nourished. No distress.  HENT:  Nose: Nose normal.  Mouth/Throat: Oropharynx is clear and moist.  Neck: Neck supple. No  thyromegaly present.  Cardiovascular: Normal rate and regular rhythm.   Pulmonary/Chest: Breath sounds normal. No respiratory distress. She has no wheezes.  Abdominal: Soft. Bowel sounds are normal. There is no tenderness.  Musculoskeletal: She exhibits no edema or tenderness.  Lymphadenopathy:    She has no cervical adenopathy.  Skin: No rash noted. No erythema.  Psychiatric: She has a normal mood and affect. Her behavior is normal.    BP 120/64   Pulse 70   Temp 98.1 F (36.7 C) (Oral)   Resp 18   Ht '5\' 4"'  (1.626 m)   Wt 135 lb 8 oz (61.5 kg)   SpO2 95%   BMI 23.26 kg/m  Wt Readings from Last 3 Encounters:  10/09/15 135 lb 8 oz (61.5 kg)  06/07/15 141 lb 2 oz (64 kg)  12/27/14 141 lb (64 kg)     Lab Results  Component Value Date   WBC 6.4 12/07/2014   HGB 12.4 12/07/2014   HCT 37.8 12/07/2014   PLT 182.0 12/07/2014   GLUCOSE 138 (H) 07/19/2015   CHOL 121 07/19/2015   TRIG 142.0 07/19/2015   HDL 34.50 (L) 07/19/2015   LDLCALC 58 07/19/2015   ALT 11 07/19/2015   AST 17 07/19/2015   NA 139 07/19/2015   K 4.2 07/19/2015   CL 105 07/19/2015   CREATININE 1.00 07/19/2015   BUN 12 07/19/2015   CO2 30 07/19/2015   TSH 0.77 12/07/2014   HGBA1C 7.4 (H) 07/19/2015   MICROALBUR 0.8 04/18/2015    Mr Lumbar Spine Wo Contrast  Result Date: 08/09/2015 CLINICAL DATA:  Low back and left leg and foot pain for 6 months. No known injury. EXAM: MRI LUMBAR SPINE WITHOUT CONTRAST TECHNIQUE: Multiplanar, multisequence MR imaging of the lumbar spine was performed. No intravenous contrast was administered. COMPARISON:  Plain films lumbar spine 06/11/2015. MRI lumbar spine 03/09/2007. FINDINGS: Segmentation:  Unremarkable. Alignment:  Maintained. Vertebrae: Vertebral body height is normal. Scattered hemangiomas are seen as on the prior exam. No worrisome lesion. Conus medullaris: Extends to the L1-2 level and appears normal. Paraspinal and other soft tissues: T2 hyperintense lesions in the  kidneys compatible with cysts are unchanged. Disc levels: T11-12 is imaged in sagittal plane only. Very shallow central protrusion without central canal or foraminal narrowing is identified. T12-L1: Negative. L1-2: Very shallow disc bulge without central canal or foraminal narrowing is unchanged. L2-3: There has been progression of disease at this level. Shallow broad-based disc bulge has increased in size and there is a superimposed small right paracentral protrusion. Moderate central canal narrowing is present  with narrowing in the right lateral recess. The foramina are open. No nerve root compression. L3-4: Shallow disc bulge is identified. Small central protrusion seen on the prior study has decreased in size. The central canal and right foramen are open. Mild left foraminal narrowing is noted. L4-5: There has been progression of disease at this level. Broad-based disc bulge with a new superimposed large left foraminal protrusion is identified. The protrusion causes severe left foraminal narrowing and impingement on the exiting left L4 root. There is also new right paracentral and lateral recess protrusion at this level. Narrowing in the right lateral recess due to disc impinges on the descending right L5 root. The right foramen is patent. L5-S1: Broad-based disc bulge somewhat more prominent to the right is unchanged. The thecal sac is patent but there is narrowing in the lateral recesses which could impact either descending S1 root. The foramina are mildly narrowed. IMPRESSION: Given left lower extremity symptoms, dominant findings are at L4-5 where a new left large foraminal impinges on the exiting left L4 root. A new right paracentral and lateral recess disc protrusion at this level encroaches on the descending right L5 root in the right lateral recess. Increased disc bulge and a new shallow right paracentral protrusion at L2-3 result in narrowing in the right lateral recess which could impact the descending  right L3 root. Small central protrusion at L3-4 seen on the prior study has decreased in size. Mild left foraminal narrowing at this level is noted. Electronically Signed   By: Inge Rise M.D.   On: 08/09/2015 14:57       Assessment & Plan:   Problem List Items Addressed This Visit    Breast cancer ([place])    On tamoxifen.  Seeing Dr Bary Castilla.  Mammogram 12/12/14- Birads II.  Has f/u scheduled in 11/2015.        Diabetes mellitus ([place])    States her sugars are doing better.  120s mostly in am.  168 highest.  Check met b and a1c.       Relevant Orders   Hemoglobin J0L   Basic metabolic panel   Hypercholesterolemia    On lipitor.  Follow lipid panel and liver function tests.        Relevant Orders   Lipid panel   Hepatic function panel   Hypertension    Blood pressure under good control.  Continue same medication regimen.  Follow pressures.  Follow metabolic panel.        Relevant Orders   CBC with Differential/Platelet   TSH   Left leg pain    Saw Dr Sharlet Salina.  Better.  Follow.       Myasthenia gravis ([place])    Off mestinon and MTX.  Doing well.  Continue f/u with Dr Manuella Ghazi.        Other Visit Diagnoses   None.      Einar Pheasant, MD

## 2015-10-09 NOTE — Assessment & Plan Note (Signed)
On lipitor.  Follow lipid panel and liver function tests.   

## 2015-10-09 NOTE — Assessment & Plan Note (Signed)
Saw Dr Sharlet Salina.  Better.  Follow.

## 2015-10-10 ENCOUNTER — Encounter: Payer: Self-pay | Admitting: *Deleted

## 2015-10-15 ENCOUNTER — Telehealth: Payer: Self-pay | Admitting: Internal Medicine

## 2015-10-15 NOTE — Telephone Encounter (Signed)
Pt called needing some help with knowing where her blood sugar is located on her paperwork with lab results. Please advise?  Call pt @ [phone]. Thank you!

## 2015-10-15 NOTE — Telephone Encounter (Signed)
Called patient and explained where she could find the results.

## 2015-10-24 ENCOUNTER — Other Ambulatory Visit: Payer: Self-pay | Admitting: General Surgery

## 2015-10-24 DIAGNOSIS — D0511 Intraductal carcinoma in situ of right breast: Secondary | ICD-10-CM

## 2015-10-31 ENCOUNTER — Encounter: Payer: Self-pay | Admitting: *Deleted

## 2015-12-10 DIAGNOSIS — Z23 Encounter for immunization: Secondary | ICD-10-CM | POA: Diagnosis not present

## 2015-12-12 ENCOUNTER — Encounter: Payer: Self-pay | Admitting: *Deleted

## 2015-12-13 ENCOUNTER — Ambulatory Visit
Admission: RE | Admit: 2015-12-13 | Discharge: 2015-12-13 | Disposition: A | Discharge status: Home / Self Care | Payer: Medicare Other | Source: Ambulatory Visit | Attending: General Surgery | Admitting: General Surgery

## 2015-12-13 ENCOUNTER — Other Ambulatory Visit: Payer: Self-pay | Admitting: General Surgery

## 2015-12-13 DIAGNOSIS — D0511 Intraductal carcinoma in situ of right breast: Secondary | ICD-10-CM

## 2015-12-13 DIAGNOSIS — R928 Other abnormal and inconclusive findings on diagnostic imaging of breast: Secondary | ICD-10-CM | POA: Diagnosis not present

## 2015-12-18 ENCOUNTER — Ambulatory Visit: Payer: Medicare Other | Admitting: General Surgery

## 2015-12-24 ENCOUNTER — Telehealth: Payer: Self-pay | Admitting: Internal Medicine

## 2015-12-24 NOTE — Telephone Encounter (Signed)
I called pt and left a vm to sch AWV. Thank you!

## 2016-01-01 ENCOUNTER — Encounter: Payer: Self-pay | Admitting: General Surgery

## 2016-01-01 ENCOUNTER — Ambulatory Visit (INDEPENDENT_AMBULATORY_CARE_PROVIDER_SITE_OTHER): Payer: Medicare Other | Admitting: General Surgery

## 2016-01-01 ENCOUNTER — Ambulatory Visit
Admission: RE | Admit: 2016-01-01 | Discharge: 2016-01-01 | Disposition: A | Discharge status: Home / Self Care | Payer: Medicare Other | Source: Ambulatory Visit | Attending: General Surgery | Admitting: General Surgery

## 2016-01-01 VITALS — BP 118/76 | HR 76 | Resp 12 | Ht 64.0 in | Wt 139.0 lb

## 2016-01-01 DIAGNOSIS — S92912A Unspecified fracture of left toe(s), initial encounter for closed fracture: Secondary | ICD-10-CM | POA: Insufficient documentation

## 2016-01-01 DIAGNOSIS — X58XXXA Exposure to other specified factors, initial encounter: Secondary | ICD-10-CM | POA: Insufficient documentation

## 2016-01-01 DIAGNOSIS — M7989 Other specified soft tissue disorders: Secondary | ICD-10-CM | POA: Diagnosis not present

## 2016-01-01 DIAGNOSIS — D0511 Intraductal carcinoma in situ of right breast: Secondary | ICD-10-CM

## 2016-01-01 DIAGNOSIS — S92512A Displaced fracture of proximal phalanx of left lesser toe(s), initial encounter for closed fracture: Secondary | ICD-10-CM | POA: Diagnosis not present

## 2016-01-01 DIAGNOSIS — M79675 Pain in left toe(s): Secondary | ICD-10-CM

## 2016-01-01 NOTE — Patient Instructions (Addendum)
The patient is aware to call back for any questions or concerns. The patient has been asked to return to the office in one year with a bilateral diagnostic mammogram.  Xray left foot

## 2016-01-01 NOTE — Progress Notes (Signed)
Patient ID: Cynthia [name], female   DOB: Apr 20, 1931, 82 y.o.   MRN: [id number]  Chief Complaint  Patient presents with  . Follow-up    mammogram    HPI Cynthia [name] is a 82 y.o. female.  female who presents for follow up breast cancer and a breast evaluation. The most recent mammogram was done on 12/13/15. No Cynthia breast issues. Tolerating her tamoxifen. Patient does perform regular self breast checks and gets regular mammograms done.   HPI  Past Medical History:  Diagnosis Date  . Allergic rhinitis   . Breast cancer ([place]) 2014   RT LUMPECTOMY  . 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 Resected margins negative. Whole breast radiation  . Cancer ([place]) 12-15-12   left upper arm, malignant melanoma Clark level II, Breslow thickness: 0.35 mm.  . Diabetes mellitus ([place])   . Hypercholesterolemia   . Hypertension   . 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 S/P radiation therapy 2015   BREAST CA    Past Surgical History:  Procedure Laterality Date  . BREAST BIOPSY Right 2014   DCIS  . BREAST SURGERY Right 2014   lumpectomy  . EYE SURGERY     Caterac both eyes    Family History  Problem Relation Age of Onset  . Skin cancer Father   . Cancer Father     colon  . Colon cancer Brother   . Cancer Brother     colon  . Breast cancer      niece    Social History Social History  Substance Use Topics  . Smoking status: Never Smoker  . Smokeless tobacco: Never Used  . Alcohol use No    Allergies  Allergen Reactions  . No Known Drug Allergy     Current Outpatient Prescriptions  Medication Sig Dispense Refill  . ACCU-CHEK AVIVA PLUS test strip USE TO CHECK BLOOD SUGAR TWICE A DAY. DX. E11.9 100 each 3  . ACCU-CHEK SOFTCLIX LANCETS lancets USE TWICE A DAY 100 each 3  . aspirin 81 MG tablet Take 81 mg by mouth daily.    Marland Kitchen atorvastatin (LIPITOR) 20 MG tablet TAKE 1 TABLET EVERY DAY 90 tablet 3  . Calcium Carbonate-Vitamin D (CALCIUM 600+D) 600-400  MG-UNIT per tablet Take 1 tablet by mouth 2 (two) times daily.    . hydrochlorothiazide (HYDRODIURIL) 25 MG tablet TAKE 1 TABLET EVERY DAY 90 tablet 3  . potassium chloride (K-DUR) 10 MEQ tablet TAKE 1 TABLET EVERY DAY 90 tablet 1  . ramipril (ALTACE) 10 MG capsule TAKE 1 CAPSULE EVERY DAY 90 capsule 3  . tamoxifen (NOLVADEX) 20 MG tablet TAKE 1 TABLET EVERY DAY 90 tablet 3   No current facility-administered medications for this visit.     Review of Systems Review of Systems  Constitutional: Negative.   Respiratory: Negative.   Cardiovascular: Negative.     Blood pressure 118/76, pulse 76, resp. rate 12, height 5\' 4"  (1.626 m), weight 139 lb (63 kg).  Physical Exam Physical Exam  Constitutional: She is oriented to person, place, and time. She appears well-developed and well-nourished.  HENT:  Mouth/Throat: Oropharynx is clear and moist.  Eyes: Conjunctivae are normal. No scleral icterus.  Neck: Neck supple.  Cardiovascular: Normal rate, regular rhythm and S1 normal.   Pulmonary/Chest: Effort normal and breath sounds normal. Right breast exhibits no inverted nipple, no mass, no nipple discharge, no skin change and no tenderness. Left breast exhibits  no inverted nipple, no mass, no nipple discharge, no skin change and no tenderness.    Right breast incision well healed, thickening at wide excision site from 11-2 o'clock   Musculoskeletal:       Feet:  Lymphadenopathy:    She has no cervical adenopathy.    She has no axillary adenopathy.  Neurological: She is alert and oriented to person, place, and time.  Skin: Skin is warm and dry.  Swelling and redness second toe left foot.  Psychiatric: Her behavior is normal.    Data Reviewed Bilateral mammograms dated 12/13/2015 were reviewed. BI-RADS-2.  Assessment    Benign breast exam.  Left toe swelling.    Plan    There is no clear trauma to the area, but the exam is consistent with injury to the left second toe PIP  joint. We'll arrange for a plain film of the right foot in the near future.      The patient has been asked to return to the office in one year with a bilateral diagnostic mammogram.  This information has been scribed by Karie Fetch RN, BSN,BC.    Robert Bellow 01/01/2016, 9:06 PM

## 2016-01-02 ENCOUNTER — Telehealth: Payer: Self-pay | Admitting: *Deleted

## 2016-01-02 NOTE — Telephone Encounter (Signed)
Notified patient as instructed, patient pleased. Discussed follow-up appointments, patient agrees  

## 2016-01-02 NOTE — Telephone Encounter (Signed)
-----   Message from Robert Bellow, MD sent at 01/02/2016  7:18 AM EST ----- Please notify the patient that she does have a fracture where the swelling is noted on the left second toe. I don't think she'll need to have this evaluated by podiatry, but she can do so if she desires. ----- Message ----- From: Interface, Rad Results In Sent: 01/02/2016   4:53 AM To: Robert Bellow, MD

## 2016-01-09 ENCOUNTER — Other Ambulatory Visit: Payer: Self-pay | Admitting: Internal Medicine

## 2016-01-28 ENCOUNTER — Telehealth: Payer: Self-pay | Admitting: Internal Medicine

## 2016-01-28 ENCOUNTER — Ambulatory Visit: Payer: No Typology Code available for payment source | Admitting: Family Medicine

## 2016-01-28 DIAGNOSIS — J209 Acute bronchitis, unspecified: Secondary | ICD-10-CM | POA: Diagnosis not present

## 2016-01-28 DIAGNOSIS — E119 Type 2 diabetes mellitus without complications: Secondary | ICD-10-CM | POA: Diagnosis not present

## 2016-01-28 DIAGNOSIS — R05 Cough: Secondary | ICD-10-CM | POA: Diagnosis not present

## 2016-01-28 LAB — HM DIABETES EYE EXAM

## 2016-01-28 NOTE — Telephone Encounter (Signed)
Called patient back states she went to walk in clinic.  She had eye appointment at 2:00pm.

## 2016-01-28 NOTE — Telephone Encounter (Signed)
The patients wants an office visit with Dr. Nicki Reaper I offered her an appointment with Ar[facility] or Dr. Lacinda Axon , but the patient wants to see Dr. Nicki Reaper. She complains of trobbing and swelling on her second toe on her left foot. She stated that an  x-ray was done by Dr. Bary Castilla ,but she could not tell me the results. She also has some congestion going on and request a Z pack.

## 2016-01-28 NOTE — Telephone Encounter (Signed)
Left message to call on answering machine

## 2016-01-28 NOTE — Telephone Encounter (Signed)
I apologized to patient that we couldn't get patient in for appointment

## 2016-01-29 NOTE — Telephone Encounter (Signed)
Noted.  Let us know if needs anything more.

## 2016-01-30 ENCOUNTER — Encounter: Payer: Self-pay | Admitting: Internal Medicine

## 2016-02-07 ENCOUNTER — Encounter: Payer: Self-pay | Admitting: Internal Medicine

## 2016-02-21 ENCOUNTER — Other Ambulatory Visit: Payer: Self-pay | Admitting: Internal Medicine

## 2016-02-21 ENCOUNTER — Other Ambulatory Visit: Payer: Self-pay | Admitting: General Surgery

## 2016-02-28 ENCOUNTER — Telehealth: Payer: Self-pay | Admitting: *Deleted

## 2016-02-28 ENCOUNTER — Ambulatory Visit: Payer: No Typology Code available for payment source | Admitting: Family

## 2016-02-28 MED ORDER — ATORVASTATIN CALCIUM 20 MG PO TABS: 20.0000 mg | ORAL_TABLET | Freq: Every day | ORAL | 2 refills | Status: DC

## 2016-02-28 NOTE — Telephone Encounter (Signed)
Patient has requested a medication refill for hydrochlorothiazide and atorvastatin Pharmacy [facility] mail order

## 2016-02-28 NOTE — Telephone Encounter (Signed)
RX sent to pharmacy  

## 2016-03-04 ENCOUNTER — Ambulatory Visit: Payer: No Typology Code available for payment source

## 2016-03-05 ENCOUNTER — Ambulatory Visit: Payer: No Typology Code available for payment source

## 2016-03-10 ENCOUNTER — Telehealth: Payer: Self-pay | Admitting: Internal Medicine

## 2016-03-10 NOTE — Telephone Encounter (Signed)
Left hip pain , left leg will give out and she will just fall. Last week she fell 3 times.  Already has fallen today once today.   When she has fallen left knee was bruised.  Patient doesn't have cane or walker to assist her with.   Denies dizziness.  Patient denies medication changes or hitting head.   Please advise.   Would like appointment with you first.

## 2016-03-10 NOTE — Telephone Encounter (Signed)
With left hip pain s/p fall as recent as today, I would recommend her to be seen this pm to confirm no acute injury.  To acute care now and then we can f/u after.

## 2016-03-10 NOTE — Telephone Encounter (Signed)
PT called and stated that she has been falling off and on for the last 3 weeks. She states that her left leg is just giving out. Please advise, thank you!  Call pt @ [phone]

## 2016-03-10 NOTE — Telephone Encounter (Signed)
Patient advised and verbalized understanding .   Encouraged patient to go  get evaluated at urgent care tonight . However patient states she can't drive at night.   Scheduled appointment for evaluation tomorrow with Mable Paris , Np.

## 2016-03-11 ENCOUNTER — Encounter: Payer: Self-pay | Admitting: Family

## 2016-03-11 ENCOUNTER — Ambulatory Visit (INDEPENDENT_AMBULATORY_CARE_PROVIDER_SITE_OTHER): Payer: Medicare Other

## 2016-03-11 ENCOUNTER — Ambulatory Visit: Payer: No Typology Code available for payment source | Admitting: Family

## 2016-03-11 ENCOUNTER — Ambulatory Visit (INDEPENDENT_AMBULATORY_CARE_PROVIDER_SITE_OTHER): Payer: Medicare Other | Admitting: Family

## 2016-03-11 VITALS — BP 126/70 | HR 74 | Temp 98.9°F | Ht 64.0 in | Wt 140.8 lb

## 2016-03-11 DIAGNOSIS — M25552 Pain in left hip: Secondary | ICD-10-CM

## 2016-03-11 DIAGNOSIS — R0981 Nasal congestion: Secondary | ICD-10-CM | POA: Diagnosis not present

## 2016-03-11 DIAGNOSIS — M179 Osteoarthritis of knee, unspecified: Secondary | ICD-10-CM | POA: Diagnosis not present

## 2016-03-11 MED ORDER — GUAIFENESIN ER 600 MG PO TB12: 1200.0000 mg | ORAL_TABLET | Freq: Two times a day (BID) | ORAL | 3 refills | Status: DC

## 2016-03-11 NOTE — Progress Notes (Signed)
Subjective:    Patient ID: Cynthia [name], female    DOB: [birth year], 83 y.o.   MRN: [id number]  CC: Cynthia [name] is a 83 y.o. female who presents today for an acute visit.    HPI: CC: left buttock pain x month, unchanged.   3 falls last week on carpet, hit the left knee. Knee pain has improved. Didn't hit head and no LOC. Describes left buttuck pain which thinks is 'causing falls'. Some left knee pain. describes knee feeling sense of giving out. No back pain. Doesn't believe she tripped to cause falls.    Denies exertional chest pain or pressure, numbness or tingling radiating to left arm or jaw, palpitations, dizziness, frequent headaches, changes in vision, or shortness of breath now OR during episodes. No LE weakness. Walks daily for exercise.  H/o mysthenia gravis- in remission. Follows with Dr Manuella Ghazi.   Also c/o hoarseness, productive cough x 3 weeks, unchanged.  Clear runny. No fever, SOB, wheezing, sore throat, sinus pressure, ear pain. Treated with azithromycin 3 weeks ago, improved. Some congestion remiains. Not taking zyrtec right now.     HISTORY:  Past Medical History:  Diagnosis Date  . Allergic rhinitis   . Breast cancer ([place]) 2014   RT LUMPECTOMY  . 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 Resected margins negative. Whole breast radiation  . Cancer ([place]) 12-15-12   left upper arm, malignant melanoma Clark level II, Breslow thickness: 0.35 mm.  . Diabetes mellitus ([place])   . Hypercholesterolemia   . Hypertension   . Myasthenia gravis ([place])    ocular  . S/P radiation therapy 2015   BREAST CA   Past Surgical History:  Procedure Laterality Date  . BREAST BIOPSY Right 2014   DCIS  . BREAST SURGERY Right 2014   lumpectomy  . EYE SURGERY     Caterac both eyes   Family History  Problem Relation Age of Onset  . Skin cancer Father   . Cancer Father     colon  . Colon cancer Brother   . Cancer Brother     colon  . Breast cancer     niece    Allergies: No known drug allergy Current Outpatient Prescriptions on File Prior to Visit  Medication Sig Dispense Refill  . ACCU-CHEK AVIVA PLUS test strip USE TO CHECK BLOOD SUGAR TWICE A DAY. DX. E11.9 100 each 3  . ACCU-CHEK SOFTCLIX LANCETS lancets USE TWICE A DAY 100 each 3  . aspirin 81 MG tablet Take 81 mg by mouth daily.    Marland Kitchen atorvastatin (LIPITOR) 20 MG tablet Take 1 tablet (20 mg total) by mouth daily. 90 tablet 2  . Calcium Carbonate-Vitamin D (CALCIUM 600+D) 600-400 MG-UNIT per tablet Take 1 tablet by mouth 2 (two) times daily.    . hydrochlorothiazide (HYDRODIURIL) 25 MG tablet TAKE 1 TABLET EVERY DAY 90 tablet 0  . potassium chloride (K-DUR) 10 MEQ tablet TAKE 1 TABLET EVERY DAY 90 tablet 1  . potassium chloride (K-DUR,KLOR-CON) 10 MEQ tablet TAKE 1 TABLET EVERY DAY 90 tablet 0  . ramipril (ALTACE) 10 MG capsule TAKE 1 CAPSULE EVERY DAY 90 capsule 3  . tamoxifen (NOLVADEX) 20 MG tablet TAKE 1 TABLET EVERY DAY 90 tablet 3   No current facility-administered medications on file prior to visit.     Social History  Substance Use Topics  . Smoking status: Never Smoker  . Smokeless tobacco: Never Used  . Alcohol  use No    Review of Systems  Constitutional: Negative for chills and fever.  HENT: Positive for congestion, postnasal drip and rhinorrhea. Negative for sinus pressure, sore throat, trouble swallowing and voice change.   Eyes: Negative for visual disturbance.  Respiratory: Positive for cough. Negative for shortness of breath and wheezing.   Cardiovascular: Negative for chest pain and palpitations.  Gastrointestinal: Negative for nausea and vomiting.  Musculoskeletal: Negative for back pain, gait problem and joint swelling.  Neurological: Negative for dizziness and headaches.      Objective:    BP 126/70   Pulse 74   Temp 98.9 F (37.2 C) (Oral)   Ht 5\' 4"  (1.626 m)   Wt 140 lb 12.8 oz (63.9 kg)   SpO2 95%   BMI 24.17 kg/m    Physical Exam    Constitutional: She appears well-developed and well-nourished.  HENT:  Head: Normocephalic and atraumatic.  Right Ear: Hearing, tympanic membrane, external ear and ear canal normal. No drainage, swelling or tenderness. No foreign bodies. Tympanic membrane is not erythematous and not bulging. No middle ear effusion. No decreased hearing is noted.  Left Ear: Hearing, tympanic membrane, external ear and ear canal normal. No drainage, swelling or tenderness. No foreign bodies. Tympanic membrane is not erythematous and not bulging.  No middle ear effusion. No decreased hearing is noted.  Nose: Nose normal. No rhinorrhea. Right sinus exhibits no maxillary sinus tenderness and no frontal sinus tenderness. Left sinus exhibits no maxillary sinus tenderness and no frontal sinus tenderness.  Mouth/Throat: Uvula is midline, oropharynx is clear and moist and mucous membranes are normal. No oropharyngeal exudate, posterior oropharyngeal edema, posterior oropharyngeal erythema or tonsillar abscesses.  Eyes: Conjunctivae are normal.  Cardiovascular: Normal rate, regular rhythm, normal heart sounds and normal pulses.   Pulmonary/Chest: Effort normal and breath sounds normal. She has no wheezes. She has no rhonchi. She has no rales.  Musculoskeletal:       Left knee: She exhibits normal range of motion, no swelling and no effusion. No tenderness found. No medial joint line and no lateral joint line tenderness noted.       Lumbar back: She exhibits normal range of motion, no tenderness, no bony tenderness, no swelling, no edema, no pain and no spasm.  Full range of motion with flexion, tension, lateral side bends. No bony tenderness. No pain, numbness, tingling elicited with single leg raise bilaterally.  Left Hip: No limp or waddling gait. Full ROM with flexion and hip rotation in flexion. No pain of lateral hip with  (flexion-abduction-external rotation) test. No pain with deep palpation of greater trochanter.  Tenderness over left SI joint.  Bilateral knees are symmetric. No effusion appreciated. No increase in warmth or erythema. Crepitus felt with flexion of bilateral knees.  Right knee:  Able to extend to -5 to 10 degrees and flex to 110 degrees. No catching with McMurray maneuver. No patellar apprehension. Negative anterior drawer and lachman's- no laxity appreciated.  No calf tenderness of lower leg edema bilaterally.     Lymphadenopathy:       Head (right side): No submental, no submandibular, no tonsillar, no preauricular, no posterior auricular and no occipital adenopathy present.       Head (left side): No submental, no submandibular, no tonsillar, no preauricular, no posterior auricular and no occipital adenopathy present.    She has no cervical adenopathy.  Neurological: She is alert. She has normal strength. No sensory deficit.  Reflex Scores:  Patellar reflexes are 2+ on the right side and 2+ on the left side. Sensation and strength intact bilateral lower extremities.  Skin: Skin is warm and dry.  Psychiatric: She has a normal mood and affect. Her speech is normal and behavior is normal. Thought content normal.  Vitals reviewed.      Assessment & Plan:   1. Left hip pain Symptoms most consistent with SI joint dysfunction. Benign knee exam. Pending x-rays of joints below including knee, tib-fib as well. Alternately considering neuropathy or carpet at home as cause of falls.   - DG HIP UNILAT WITH PELVIS 2-3 VIEWS LEFT - DG Knee Complete 4 Views Left - DG Tibia/Fibula Left  2. Sinus congestion Improvement with  azithromycin 3 weeks ago. Clear congestion. Afebrile.  Patient and I jointly agreed to delay antibiotics use the patient will try Mucinex for a few days. She understands to let me know if she is not improved on Mucinex. At that point we'll consider another antibiotic.  - guaiFENesin (MUCINEX) 600 MG 12 hr tablet; Take 2 tablets (1,200 mg total) by mouth 2 (two)  times daily.  Dispense: 28 tablet; Refill: 3    I am having Ms. Carone maintain her aspirin, Calcium Carbonate-Vitamin D, ACCU-CHEK SOFTCLIX LANCETS, ramipril, potassium chloride, ACCU-CHEK AVIVA PLUS, potassium chloride, hydrochlorothiazide, tamoxifen, and atorvastatin.   No orders of the defined types were placed in this encounter.   Return precautions given.   Risks, benefits, and alternatives of the medications and treatment plan prescribed today were discussed, and patient expressed understanding.   Education regarding symptom management and diagnosis given to patient on AVS.  Continue to follow with Einar Pheasant, MD for routine health maintenance.   Cynthia [name] and I agreed with plan.   Mable Paris, FNP

## 2016-03-11 NOTE — Patient Instructions (Signed)
As discussed, I suspect that hcongestion is viral in nature. I would like for you start a course of Mucinex. If this does not help her symptoms, at that point we can consider another antibiotic. X-rays today. We will call with results.  If there is no improvement in your symptoms, or if there is any worsening of symptoms, or if you have any additional concerns, please return for re-evaluation; or, if we are closed, consider going to the Emergency Room for evaluation if symptoms urgent.

## 2016-03-11 NOTE — Progress Notes (Signed)
Pre visit review using our clinic review tool, if applicable. No additional management support is needed unless otherwise documented below in the visit note. 

## 2016-03-12 ENCOUNTER — Telehealth: Payer: Self-pay | Admitting: Family

## 2016-03-12 NOTE — Telephone Encounter (Signed)
Discussed results of Xrays which showed primarily arthritis.  Advised pt to trial tylenol arthritis.  We also discussed fall safety in the house including wearing supportive shoes ( she usually just wears socks only) and picking her feet up when she crosses threshold or carpet. Also discussed possible neuropathy.   She will f/u with myself or PCP in a couple of weeks with OV. She will let us know if any new falls.  Congestion remains the same. No fever. She will start mucinex tomorrow once she can get out of the house.

## 2016-03-16 ENCOUNTER — Encounter: Payer: Self-pay | Admitting: Family

## 2016-04-08 ENCOUNTER — Other Ambulatory Visit: Payer: Self-pay | Admitting: Internal Medicine

## 2016-04-08 ENCOUNTER — Telehealth: Payer: Self-pay | Admitting: *Deleted

## 2016-04-08 NOTE — Telephone Encounter (Signed)
Pt requested to have a 200 qt of Accu-Check Aviva Plus test strips because she has to check her blood sugar 2 times daily.  Pharmacy [facility] [address]

## 2016-04-10 ENCOUNTER — Other Ambulatory Visit: Payer: Self-pay | Admitting: Internal Medicine

## 2016-04-10 NOTE — Telephone Encounter (Signed)
Done on 04-08-16

## 2016-04-29 ENCOUNTER — Telehealth: Payer: Self-pay | Admitting: *Deleted

## 2016-04-29 NOTE — Telephone Encounter (Signed)
Pt is currently having left hip pain,she has requested to have a Cortizone injection. Pt has a Hx of hip pain.  Pt contact [phone]

## 2016-04-29 NOTE — Telephone Encounter (Signed)
Spoke to pt patient she is going to call [facility] office to make app for [facility]

## 2016-05-20 ENCOUNTER — Ambulatory Visit (INDEPENDENT_AMBULATORY_CARE_PROVIDER_SITE_OTHER): Payer: Medicare Other

## 2016-05-20 VITALS — BP 138/78 | HR 73 | Temp 98.2°F | Resp 12 | Ht 64.0 in | Wt 145.0 lb

## 2016-05-20 DIAGNOSIS — Z0000 Encounter for general adult medical examination without abnormal findings: Secondary | ICD-10-CM

## 2016-05-20 NOTE — Progress Notes (Signed)
Subjective:   Cynthia [name] is a 83 y.o. female who presents for Medicare Annual (Subsequent) preventive examination.  Review of Systems: No ROS.  Medicare Wellness Visit.  Cardiac Risk Factors include: advanced age (>70men, >23 women);diabetes mellitus;hypertension     Objective:     Vitals: BP 138/78 (BP Location: Left Arm, Patient Position: Sitting, Cuff Size: Normal)   Pulse 73   Temp 98.2 F (36.8 C) (Oral)   Resp 12   Ht 5\' 4"  (1.626 m)   Wt 145 lb (65.8 kg)   SpO2 98%   BMI 24.89 kg/m   Body mass index is 24.89 kg/m.   Tobacco History  Smoking Status  . Never Smoker  Smokeless Tobacco  . Never Used     Counseling given: Not Answered   Past Medical History:  Diagnosis Date  . Allergic rhinitis   . Breast cancer ([place]) 2014   RT LUMPECTOMY  . 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 Resected margins negative. Whole breast radiation  . Cancer ([place]) 12-15-12   left upper arm, malignant melanoma Clark level II, Breslow thickness: 0.35 mm.  . Diabetes mellitus ([place])   . Hypercholesterolemia   . Hypertension   . Myasthenia gravis ([place])    ocular  . S/P radiation therapy 2015   BREAST CA   Past Surgical History:  Procedure Laterality Date  . BREAST BIOPSY Right 2014   DCIS  . BREAST SURGERY Right 2014   lumpectomy  . EYE SURGERY     Caterac both eyes   Family History  Problem Relation Age of Onset  . Skin cancer Father   . Cancer Father     colon  . Colon cancer Brother   . Cancer Brother     colon  . Breast cancer      niece   History  Sexual Activity  . Sexual activity: No    Outpatient Encounter Prescriptions as of 05/20/2016  Medication Sig  . ACCU-CHEK AVIVA PLUS test strip USE TO CHECK BLOOD SUGAR TWICE A DAY. DX. E11.9  . ACCU-CHEK SOFTCLIX LANCETS lancets USE TWICE A DAY  . aspirin 81 MG tablet Take 81 mg by mouth daily.  Marland Kitchen atorvastatin (LIPITOR) 20 MG tablet Take 1 tablet (20 mg total) by mouth daily.  .  Calcium Carbonate-Vitamin D (CALCIUM 600+D) 600-400 MG-UNIT per tablet Take 1 tablet by mouth 2 (two) times daily.  Marland Kitchen guaiFENesin (MUCINEX) 600 MG 12 hr tablet Take 2 tablets (1,200 mg total) by mouth 2 (two) times daily.  . hydrochlorothiazide (HYDRODIURIL) 25 MG tablet TAKE 1 TABLET EVERY DAY  . Multiple Vitamins-Minerals (ICAPS AREDS 2) CAPS Take 1 capsule by mouth 2 (two) times daily.  . potassium chloride (K-DUR) 10 MEQ tablet TAKE 1 TABLET EVERY DAY  . potassium chloride (K-DUR,KLOR-CON) 10 MEQ tablet TAKE 1 TABLET EVERY DAY  . ramipril (ALTACE) 10 MG capsule TAKE 1 CAPSULE EVERY DAY  . tamoxifen (NOLVADEX) 20 MG tablet TAKE 1 TABLET EVERY DAY   No facility-administered encounter medications on file as of 05/20/2016.     Activities of Daily Living In your present state of health, do you have any difficulty performing the following activities: 05/20/2016  Hearing? N  Vision? N  Difficulty concentrating or making decisions? N  Walking or climbing stairs? Y  Dressing or bathing? N  Doing errands, shopping? N  Preparing Food and eating ? N  Using the Toilet? N  In the past six  months, have you accidently leaked urine? N  Do you have problems with loss of bowel control? N  Managing your Medications? N  Managing your Finances? N  Housekeeping or managing your Housekeeping? N  Some recent data might be hidden    Patient Care Team: Einar Pheasant, MD as PCP - General (Internal Medicine) Robert Bellow, MD (General Surgery) Einar Pheasant, MD (Internal Medicine) Barrie Dunker, MD (Dermatology)    Assessment:    This is a routine wellness examination for [place]. The goal of the wellness visit is to assist the patient how to close the gaps in care and create a preventative care plan for the patient.   Taking calcium VIT D as appropriate/Osteoporosis risk reviewed.  Medications reviewed; taking without issues or barriers.  Safety issues reviewed; smoke detectors in  the home. No firearms in the home.  Wears seatbelts when driving or riding with others. Patient does wear sunscreen or protective clothing when in direct sunlight. No violence in the home.  Patient is alert, normal appearance, oriented to person/place/and time. Correctly identified the president of the [place], recall of 3/3 words, and performing simple calculations.  Patient displays appropriate judgement and can read correct time from watch face.  No new identified risk were noted.  No failures at ADL's or IADL's.   BMI- discussed the importance of a healthy diet, water intake and exercise. Educational material provided.   HTN- followed by PCP.  Dental- every six months.  Eye- Visual acuity not assessed per patient preference since they have regular follow up with the ophthalmologist.  Wears corrective lenses.  Sleep patterns- Sleeps 5-6 hours at night.  Wakes feeling rested.  TDAP vaccine deferred per patient preference.  Follow up with insurance.  Educational material provided.  Patient Concerns: Left hip/leg pain x3 months. Follow up appointment scheduled with PCP.  Exercise Activities and Dietary recommendations Current Exercise Habits: The patient does not participate in regular exercise at present  Goals    . Healthy Lifestyle          Stay active  Stay hydrated Low carb foods.  Lean meats, vegetables.      Fall Risk Fall Risk  05/20/2016 03/11/2016 11/15/2013 11/15/2013 11/10/2012  Falls in the past year? Yes Yes No No No  Number falls in past yr: 1 2 or more - - -  Injury with Fall? No Yes - - -  Risk for fall due to : - History of fall(s) - - -  Follow up Falls prevention discussed - - - -   Depression Screen PHQ 2/9 Scores 05/20/2016 03/11/2016 11/15/2013 11/15/2013  PHQ - 2 Score 0 0 0 0     Cognitive Function     6CIT Screen 05/20/2016  What Year? 0 points  What month? 0 points  What time? 0 points  Count back from 20 0 points  Months in reverse 0 points    Repeat phrase 0 points  Total Score 0    Immunization History  Administered Date(s) Administered  . Influenza Split 12/20/2011  . Influenza,inj,Quad PF,36+ Mos 11/08/2012, 11/23/2013, 12/14/2014  . Influenza-Unspecified 12/14/2014, 12/10/2015  . Pneumococcal Conjugate-13 11/23/2013   Screening Tests Health Maintenance  Topic Date Due  . TETANUS/TDAP  11/07/1952  . DEXA SCAN  11/08/1998  . FOOT EXAM  11/16/2014  . PNA vac Low Risk Adult (2 of 2 - PPSV23) 11/24/2014  . HEMOGLOBIN A1C  04/10/2016  . MAMMOGRAM  12/12/2016  . OPHTHALMOLOGY EXAM  01/27/2017  .  INFLUENZA VACCINE  Completed      Plan:    End of life planning; Advance aging; Advanced directives discussed. Copy of current HCPOA/Living Will requested.    During the course of the visit the patient was educated and counseled about the following appropriate screening and preventive services:   Vaccines to include Pneumoccal, Influenza, Hepatitis B, Td, Zostavax, HCV  Colorectal cancer screening-UTD  Bone density screening-postponed for scheduling with MM 11/2016, per patient preference  Diabetes-followed by PCP  Glaucoma screening-annual eye exam  Mammography-due 11/2016  Nutrition counseling   Patient Instructions (the written plan) was given to the patient.   Varney Biles, LPN  0/15/[id number]   Reviewed above information.  Agree with evaluation for persistent hip pain.    Dr Nicki Reaper

## 2016-05-20 NOTE — Patient Instructions (Addendum)
  Cynthia [name] , Thank you for taking time to come for your Medicare Wellness Visit. I appreciate your ongoing commitment to your health goals. Please review the following plan we discussed and let me know if I can assist you in the future.   Follow up with Dr. Nicki Reaper as needed.    Bring a copy of your [place] and/or Living Will to be scanned into chart.  Have a great day!  These are the goals we discussed: Goals    . Healthy Lifestyle          Stay active  Stay hydrated Low carb foods.  Lean meats, vegetables.       This is a list of the screening recommended for you and due dates:  Health Maintenance  Topic Date Due  . Tetanus Vaccine  11/07/1952  . DEXA scan (bone density measurement)  11/08/1998  . Complete foot exam   11/16/2014  . Pneumonia vaccines (2 of 2 - PPSV23) 11/24/2014  . Hemoglobin A1C  04/10/2016  . Mammogram  12/12/2016  . Eye exam for diabetics  01/27/2017  . Flu Shot  Completed

## 2016-05-27 ENCOUNTER — Ambulatory Visit (INDEPENDENT_AMBULATORY_CARE_PROVIDER_SITE_OTHER): Payer: Medicare Other | Admitting: Internal Medicine

## 2016-05-27 ENCOUNTER — Encounter: Payer: Self-pay | Admitting: Internal Medicine

## 2016-05-27 DIAGNOSIS — M79605 Pain in left leg: Secondary | ICD-10-CM

## 2016-05-27 DIAGNOSIS — G7000 Myasthenia gravis without (acute) exacerbation: Secondary | ICD-10-CM

## 2016-05-27 DIAGNOSIS — I10 Essential (primary) hypertension: Secondary | ICD-10-CM | POA: Diagnosis not present

## 2016-05-27 DIAGNOSIS — E7800 Pure hypercholesterolemia, unspecified: Secondary | ICD-10-CM | POA: Diagnosis not present

## 2016-05-27 DIAGNOSIS — D0511 Intraductal carcinoma in situ of right breast: Secondary | ICD-10-CM | POA: Diagnosis not present

## 2016-05-27 DIAGNOSIS — E119 Type 2 diabetes mellitus without complications: Secondary | ICD-10-CM | POA: Diagnosis not present

## 2016-05-27 DIAGNOSIS — C4362 Malignant melanoma of left upper limb, including shoulder: Secondary | ICD-10-CM

## 2016-05-27 NOTE — Progress Notes (Signed)
Pre-visit discussion using our clinic review tool. No additional management support is needed unless otherwise documented below in the visit note.  

## 2016-05-27 NOTE — Progress Notes (Signed)
Patient ID: Cynthia [name], female   DOB: 1931/03/09, 83 y.o.   MRN: [id number]   Subjective:    Patient ID: Cynthia [name], female    DOB: [birth year], 83 y.o.   MRN: [id number]  HPI  Patient here for a scheduled follow up.  She has been having issues with left posterior lateral thigh and calf pain.  Increased pain recently.  Left hip pain as well.  MRI in 07/2015 - moderated to severe DDD with disc bulge and left foraminal stenosis.  She is on gabapentin for neuropathy.  Has an appt with Dr Sharlet Salina next week.  Discussed further treatment with her today.  Discussed medrol dose taper.  She declines.  States wants to wait for Dr Sharlet Salina evaluation.  No chest pain.  No sob.  No acid reflux.  No abdominal pain.  States am sugars averaging 110-120.     Past Medical History:  Diagnosis Date  . Allergic rhinitis   . Breast cancer ([place]) 2014   RT LUMPECTOMY  . 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 Resected margins negative. Whole breast radiation  . Cancer ([place]) 12-15-12   left upper arm, malignant melanoma Clark level II, Breslow thickness: 0.35 mm.  . Diabetes mellitus ([place])   . Hypercholesterolemia   . Hypertension   . Myasthenia gravis ([place])    ocular  . S/P radiation therapy 2015   BREAST CA   Past Surgical History:  Procedure Laterality Date  . BREAST BIOPSY Right 2014   DCIS  . BREAST SURGERY Right 2014   lumpectomy  . EYE SURGERY     Caterac both eyes   Family History  Problem Relation Age of Onset  . Skin cancer Father   . Cancer Father     colon  . Colon cancer Brother   . Cancer Brother     colon  . Breast cancer      niece   Social History   Social History  . Marital status: Divorced    Spouse name: N/A  . Number of children: N/A  . Years of education: N/A   Social History Main Topics  . Smoking status: Never Smoker  . Smokeless tobacco: Never Used  . Alcohol use No  . Drug use: No  . Sexual activity: No   Other Topics Concern    . None   Social History Narrative  . None    Outpatient Encounter Prescriptions as of 05/27/2016  Medication Sig  . ACCU-CHEK AVIVA PLUS test strip USE TO CHECK BLOOD SUGAR TWICE A DAY. DX. E11.9  . ACCU-CHEK SOFTCLIX LANCETS lancets USE TWICE A DAY  . aspirin 81 MG tablet Take 81 mg by mouth daily.  Marland Kitchen atorvastatin (LIPITOR) 20 MG tablet Take 1 tablet (20 mg total) by mouth daily.  . Calcium Carbonate-Vitamin D (CALCIUM 600+D) 600-400 MG-UNIT per tablet Take 1 tablet by mouth 2 (two) times daily.  Marland Kitchen guaiFENesin (MUCINEX) 600 MG 12 hr tablet Take 2 tablets (1,200 mg total) by mouth 2 (two) times daily.  . hydrochlorothiazide (HYDRODIURIL) 25 MG tablet TAKE 1 TABLET EVERY DAY  . Multiple Vitamins-Minerals (ICAPS AREDS 2) CAPS Take 1 capsule by mouth 2 (two) times daily.  . potassium chloride (K-DUR) 10 MEQ tablet TAKE 1 TABLET EVERY DAY  . potassium chloride (K-DUR,KLOR-CON) 10 MEQ tablet TAKE 1 TABLET EVERY DAY  . ramipril (ALTACE) 10 MG capsule TAKE 1 CAPSULE EVERY DAY  . tamoxifen (NOLVADEX) 20 MG tablet  TAKE 1 TABLET EVERY DAY   No facility-administered encounter medications on file as of 05/27/2016.     Review of Systems  Constitutional: Negative for appetite change and unexpected weight change.  HENT: Negative for congestion and sinus pressure.   Respiratory: Negative for cough, chest tightness and shortness of breath.   Cardiovascular: Negative for chest pain, palpitations and leg swelling.  Gastrointestinal: Negative for abdominal pain, diarrhea, nausea and vomiting.  Genitourinary: Negative for difficulty urinating and dysuria.  Musculoskeletal: Positive for back pain.       Leg pain as outlined.    Skin: Negative for color change and rash.  Neurological: Negative for dizziness, light-headedness and headaches.  Psychiatric/Behavioral: Negative for agitation and dysphoric mood.       Objective:    Physical Exam  Constitutional: She appears well-developed and  well-nourished. No distress.  HENT:  Nose: Nose normal.  Mouth/Throat: Oropharynx is clear and moist.  Neck: Neck supple. No thyromegaly present.  Cardiovascular: Normal rate and regular rhythm.   Pulmonary/Chest: Breath sounds normal. No respiratory distress. She has no wheezes.  Abdominal: Soft. Bowel sounds are normal. There is no tenderness.  Musculoskeletal: She exhibits no edema or tenderness.  Some increased pain with straight leg raise.  Able to walk without significant pain.   Lymphadenopathy:    She has no cervical adenopathy.  Skin: No rash noted. No erythema.  Psychiatric: She has a normal mood and affect. Her behavior is normal.    BP 130/62 (BP Location: Left Arm, Patient Position: Sitting, Cuff Size: Normal)   Pulse 82   Temp 98.9 F (37.2 C) (Oral)   Resp 16   Ht _0  (1.626 m)   Wt 143 lb 3.2 oz (65 kg)   SpO2 98%   BMI 24.58 kg/m  Wt Readings from Last 3 Encounters:  05/27/16 143 lb 3.2 oz (65 kg)  05/20/16 145 lb (65.8 kg)  03/11/16 140 lb 12.8 oz (63.9 kg)     Lab Results  Component Value Date   WBC 6.5 10/09/2015   HGB 12.0 10/09/2015   HCT 36.8 10/09/2015   PLT 207.0 10/09/2015   GLUCOSE 119 (H) 10/09/2015   CHOL 131 10/09/2015   TRIG 137.0 10/09/2015   HDL 44.10 10/09/2015   LDLCALC 60 10/09/2015   ALT 11 10/09/2015   AST 16 10/09/2015   NA 139 10/09/2015   K 3.8 10/09/2015   CL 104 10/09/2015   CREATININE 1.08 10/09/2015   BUN 14 10/09/2015   CO2 30 10/09/2015   TSH 0.95 10/09/2015   HGBA1C 7.3 (H) 10/09/2015   MICROALBUR 0.8 04/18/2015    Dg Foot 2 Views Left  Result Date: 01/02/2016 CLINICAL DATA:  Left second toe swelling for 4 weeks. No known injury. EXAM: LEFT FOOT - 2 VIEW COMPARISON:  None. FINDINGS: There is a minimally medially displaced, oblique, intra-articular fracture of the head of the left second proximal phalanx. There is hallux valgus deformity with mild hypertrophy of the medial aspect of the first metatarsal head.  IMPRESSION: Minimally displaced intra-articular fracture of the left second proximal phalangeal head. Electronically Signed   By: Ulyses Jarred M.D.   On: 01/02/2016 04:51       Assessment & Plan:   Problem List Items Addressed This Visit    DCIS (ductal carcinoma in situ) of breast    On tamoxifen.  12/13/15 - Birads II.  Followed by Dr Bary Castilla.        Diabetes mellitus ([place])  Sugars as outlined.  Low carb diet and exercise.  Follow met b and a1c.        Relevant Orders   Hemoglobin A1c   Hypercholesterolemia    On lipitor.  Low cholesterol diet and exercise.  Follow lipid panel and liver function tests.        Relevant Orders   Hepatic function panel   Lipid panel   Hypertension    Blood pressure under good control.  Continue same medication regimen.  Follow pressures.  Follow metabolic panel.        Relevant Orders   Basic metabolic panel   Left leg pain    Increased pain as outlined.  Had MRI as outlined.  Discussed further treatment (I.e., medrol dose pack).  She declines.  Has appt with Dr Sharlet Salina next week.  Wants to hold on any further intervention or treatment at this time.        Malignant melanoma of arm ([place])    s/p removal.  Followed by dermatology.       Myasthenia gravis ([place])    Seeing Dr Manuella Ghazi.  Doing well.  Follow.  Off mestinon and MTX.           Einar Pheasant, MD

## 2016-06-01 ENCOUNTER — Encounter: Payer: Self-pay | Admitting: Internal Medicine

## 2016-06-01 NOTE — Assessment & Plan Note (Signed)
Increased pain as outlined.  Had MRI as outlined.  Discussed further treatment (I.e., medrol dose pack).  She declines.  Has appt with Dr Sharlet Salina next week.  Wants to hold on any further intervention or treatment at this time.

## 2016-06-01 NOTE — Assessment & Plan Note (Signed)
On tamoxifen.  12/13/15 - Birads II.  Followed by Dr Bary Castilla.

## 2016-06-01 NOTE — Assessment & Plan Note (Signed)
Sugars as outlined.  Low carb diet and exercise.  Follow met b and a1c.

## 2016-06-01 NOTE — Assessment & Plan Note (Signed)
On lipitor.  Low cholesterol diet and exercise.  Follow lipid panel and liver function tests.   

## 2016-06-01 NOTE — Assessment & Plan Note (Signed)
s/p removal.  Followed by dermatology.

## 2016-06-01 NOTE — Assessment & Plan Note (Signed)
Blood pressure under good control.  Continue same medication regimen.  Follow pressures.  Follow metabolic panel.   

## 2016-06-01 NOTE — Assessment & Plan Note (Signed)
Seeing Dr Manuella Ghazi.  Doing well.  Follow.  Off mestinon and MTX.

## 2016-06-03 DIAGNOSIS — M5126 Other intervertebral disc displacement, lumbar region: Secondary | ICD-10-CM | POA: Diagnosis not present

## 2016-06-03 DIAGNOSIS — M5416 Radiculopathy, lumbar region: Secondary | ICD-10-CM | POA: Diagnosis not present

## 2016-06-24 ENCOUNTER — Other Ambulatory Visit: Payer: Self-pay | Admitting: Internal Medicine

## 2016-07-08 DIAGNOSIS — M5126 Other intervertebral disc displacement, lumbar region: Secondary | ICD-10-CM | POA: Diagnosis not present

## 2016-07-08 DIAGNOSIS — M5416 Radiculopathy, lumbar region: Secondary | ICD-10-CM | POA: Diagnosis not present

## 2016-07-13 ENCOUNTER — Other Ambulatory Visit: Payer: Self-pay | Admitting: Internal Medicine

## 2016-07-23 ENCOUNTER — Other Ambulatory Visit: Payer: No Typology Code available for payment source

## 2016-07-24 ENCOUNTER — Other Ambulatory Visit (INDEPENDENT_AMBULATORY_CARE_PROVIDER_SITE_OTHER): Payer: Medicare Other

## 2016-07-24 DIAGNOSIS — I10 Essential (primary) hypertension: Secondary | ICD-10-CM | POA: Diagnosis not present

## 2016-07-24 DIAGNOSIS — E119 Type 2 diabetes mellitus without complications: Secondary | ICD-10-CM | POA: Diagnosis not present

## 2016-07-24 DIAGNOSIS — E7800 Pure hypercholesterolemia, unspecified: Secondary | ICD-10-CM | POA: Diagnosis not present

## 2016-07-24 LAB — HEPATIC FUNCTION PANEL
ALBUMIN: 3.8 g/dL (ref 3.5–5.2)
ALK PHOS: 59 U/L (ref 39–117)
ALT: 13 U/L (ref 0–35)
AST: 20 U/L (ref 0–37)
Bilirubin, Direct: 0.1 mg/dL (ref 0.0–0.3)
TOTAL PROTEIN: 6.8 g/dL (ref 6.0–8.3)
Total Bilirubin: 0.4 mg/dL (ref 0.2–1.2)

## 2016-07-24 LAB — BASIC METABOLIC PANEL
BUN: 15 mg/dL (ref 6–23)
CO2: 31 meq/L (ref 19–32)
CREATININE: 1.06 mg/dL (ref 0.40–1.20)
Calcium: 9.5 mg/dL (ref 8.4–10.5)
Chloride: 102 mEq/L (ref 96–112)
GFR: 52.66 mL/min — ABNORMAL LOW (ref >60.00)
Glucose, Bld: 150 mg/dL — ABNORMAL HIGH (ref 70–99)
Potassium: 3.7 mEq/L (ref 3.5–5.1)
SODIUM: 137 meq/L (ref 135–145)

## 2016-07-24 LAB — LIPID PANEL
Cholesterol: 126 mg/dL (ref 0–200)
HDL: 42.5 mg/dL (ref >39.00)
LDL Cholesterol: 57 mg/dL (ref 0–99)
NONHDL: 83.48
Total CHOL/HDL Ratio: 3
Triglycerides: 131 mg/dL (ref 0.0–149.0)
VLDL: 26.2 mg/dL (ref 0.0–40.0)

## 2016-07-24 LAB — HEMOGLOBIN A1C: Hgb A1c MFr Bld: 7.6 % — ABNORMAL HIGH (ref 4.6–6.5)

## 2016-07-28 DIAGNOSIS — H353132 Nonexudative age-related macular degeneration, bilateral, intermediate dry stage: Secondary | ICD-10-CM | POA: Diagnosis not present

## 2016-07-30 ENCOUNTER — Ambulatory Visit (INDEPENDENT_AMBULATORY_CARE_PROVIDER_SITE_OTHER): Payer: Medicare Other | Admitting: Internal Medicine

## 2016-07-30 ENCOUNTER — Encounter: Payer: Self-pay | Admitting: Internal Medicine

## 2016-07-30 VITALS — BP 134/64 | HR 74 | Temp 98.6°F | Resp 12 | Ht 64.0 in | Wt 141.6 lb

## 2016-07-30 DIAGNOSIS — E119 Type 2 diabetes mellitus without complications: Secondary | ICD-10-CM

## 2016-07-30 DIAGNOSIS — R05 Cough: Secondary | ICD-10-CM | POA: Diagnosis not present

## 2016-07-30 DIAGNOSIS — M79605 Pain in left leg: Secondary | ICD-10-CM | POA: Diagnosis not present

## 2016-07-30 DIAGNOSIS — C4362 Malignant melanoma of left upper limb, including shoulder: Secondary | ICD-10-CM | POA: Diagnosis not present

## 2016-07-30 DIAGNOSIS — E7800 Pure hypercholesterolemia, unspecified: Secondary | ICD-10-CM

## 2016-07-30 DIAGNOSIS — I10 Essential (primary) hypertension: Secondary | ICD-10-CM

## 2016-07-30 DIAGNOSIS — M8580 Other specified disorders of bone density and structure, unspecified site: Secondary | ICD-10-CM

## 2016-07-30 DIAGNOSIS — R059 Cough, unspecified: Secondary | ICD-10-CM

## 2016-07-30 DIAGNOSIS — E2839 Other primary ovarian failure: Secondary | ICD-10-CM

## 2016-07-30 DIAGNOSIS — D0511 Intraductal carcinoma in situ of right breast: Secondary | ICD-10-CM | POA: Diagnosis not present

## 2016-07-30 DIAGNOSIS — G7000 Myasthenia gravis without (acute) exacerbation: Secondary | ICD-10-CM

## 2016-07-30 NOTE — Patient Instructions (Signed)
Zantac (ranitidine) 150mg  - take 30 minutes before her breakfast (daily)

## 2016-07-30 NOTE — Progress Notes (Signed)
Pre-visit discussion using our clinic review tool. No additional management support is needed unless otherwise documented below in the visit note.  

## 2016-07-30 NOTE — Progress Notes (Signed)
Patient ID: Cynthia [name], female   DOB: 09-01-1931, 83 y.o.   MRN: [id number]   Subjective:    Patient ID: Cynthia [name], female    DOB: 09-30-1931, 83 y.o.   MRN: [id number]  HPI  Patient here for a scheduled follow up.  She saw Dr Sharlet Salina.  S/p injection x 2.  Back is better.  No chest pain.  No sob.  Has noticed when eats will have some cough.  No swallowing problems.  No abdominal pain or cramping.  Bowels stable.  Sugars a little elevated.  a1c 7.6.  Discussed low carb diet.  Some weight loss.  No nausea or vomiting.  No diarrhea.     Past Medical History:  Diagnosis Date  . Allergic rhinitis   . Breast cancer ([place]) 2014   RT LUMPECTOMY  . 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 Resected margins negative. Whole breast radiation  . Cancer ([place]) 12-15-12   left upper arm, malignant melanoma Clark level II, Breslow thickness: 0.35 mm.  . Diabetes mellitus ([place])   . Hypercholesterolemia   . Hypertension   . Myasthenia gravis ([place])    ocular  . S/P radiation therapy 2015   BREAST CA   Past Surgical History:  Procedure Laterality Date  . BREAST BIOPSY Right 2014   DCIS  . BREAST SURGERY Right 2014   lumpectomy  . EYE SURGERY     Caterac both eyes   Family History  Problem Relation Age of Onset  . Skin cancer Father   . Cancer Father        colon  . Colon cancer Brother   . Cancer Brother        colon  . Breast cancer Unknown        niece   Social History   Social History  . Marital status: Divorced    Spouse name: N/A  . Number of children: N/A  . Years of education: N/A   Social History Main Topics  . Smoking status: Never Smoker  . Smokeless tobacco: Never Used  . Alcohol use No  . Drug use: No  . Sexual activity: No   Other Topics Concern  . None   Social History Narrative  . None    Outpatient Encounter Prescriptions as of 07/30/2016  Medication Sig  . ACCU-CHEK AVIVA PLUS test strip USE TO CHECK BLOOD SUGAR TWICE A DAY.  DX. E11.9  . ACCU-CHEK SOFTCLIX LANCETS lancets USE TWICE A DAY  . aspirin 81 MG tablet Take 81 mg by mouth daily.  Marland Kitchen atorvastatin (LIPITOR) 20 MG tablet Take 1 tablet (20 mg total) by mouth daily.  . Calcium Carbonate-Vitamin D (CALCIUM 600+D) 600-400 MG-UNIT per tablet Take 1 tablet by mouth 2 (two) times daily.  Marland Kitchen guaiFENesin (MUCINEX) 600 MG 12 hr tablet Take 2 tablets (1,200 mg total) by mouth 2 (two) times daily.  . hydrochlorothiazide (HYDRODIURIL) 25 MG tablet TAKE 1 TABLET EVERY DAY  . Multiple Vitamins-Minerals (ICAPS AREDS 2) CAPS Take 1 capsule by mouth 2 (two) times daily.  . potassium chloride (K-DUR) 10 MEQ tablet TAKE 1 TABLET EVERY DAY  . potassium chloride (K-DUR) 10 MEQ tablet TAKE 1 TABLET EVERY DAY  . ramipril (ALTACE) 10 MG capsule TAKE 1 CAPSULE EVERY DAY  . tamoxifen (NOLVADEX) 20 MG tablet TAKE 1 TABLET EVERY DAY   No facility-administered encounter medications on file as of 07/30/2016.     Review of Systems  Constitutional:  Some weight loss.  Eating.   HENT: Negative for congestion and sinus pressure.   Respiratory: Negative for chest tightness and shortness of breath.        Some cough with eating as outlined.    Cardiovascular: Negative for chest pain, palpitations and leg swelling.  Gastrointestinal: Negative for abdominal pain, diarrhea, nausea and vomiting.  Genitourinary: Negative for difficulty urinating and dysuria.  Musculoskeletal: Negative for joint swelling.       Back pain is better.    Skin: Negative for color change and rash.  Neurological: Negative for dizziness, light-headedness and headaches.  Psychiatric/Behavioral: Negative for agitation and dysphoric mood.       Objective:    Physical Exam  Constitutional: She appears well-developed and well-nourished. No distress.  HENT:  Nose: Nose normal.  Mouth/Throat: Oropharynx is clear and moist.  Neck: Neck supple. No thyromegaly present.  Cardiovascular: Normal rate and regular  rhythm.   Pulmonary/Chest: Breath sounds normal. No respiratory distress. She has no wheezes.  Abdominal: Soft. Bowel sounds are normal. There is no tenderness.  Musculoskeletal: She exhibits no edema or tenderness.  Lymphadenopathy:    She has no cervical adenopathy.  Skin: No rash noted. No erythema.  Psychiatric: She has a normal mood and affect. Her behavior is normal.    BP 134/64 (BP Location: Left Arm, Patient Position: Sitting, Cuff Size: Normal)   Pulse 74   Temp 98.6 F (37 C) (Oral)   Resp 12   Ht _0  (1.626 m)   Wt 141 lb 9.6 oz (64.2 kg)   SpO2 97%   BMI 24.31 kg/m  Wt Readings from Last 3 Encounters:  07/30/16 141 lb 9.6 oz (64.2 kg)  05/27/16 143 lb 3.2 oz (65 kg)  05/20/16 145 lb (65.8 kg)     Lab Results  Component Value Date   WBC 6.5 10/09/2015   HGB 12.0 10/09/2015   HCT 36.8 10/09/2015   PLT 207.0 10/09/2015   GLUCOSE 150 (H) 07/24/2016   CHOL 126 07/24/2016   TRIG 131.0 07/24/2016   HDL 42.50 07/24/2016   LDLCALC 57 07/24/2016   ALT 13 07/24/2016   AST 20 07/24/2016   NA 137 07/24/2016   K 3.7 07/24/2016   CL 102 07/24/2016   CREATININE 1.06 07/24/2016   BUN 15 07/24/2016   CO2 31 07/24/2016   TSH 0.95 10/09/2015   HGBA1C 7.6 (H) 07/24/2016   MICROALBUR 0.8 04/18/2015    Dg Foot 2 Views Left  Result Date: 01/02/2016 CLINICAL DATA:  Left second toe swelling for 4 weeks. No known injury. EXAM: LEFT FOOT - 2 VIEW COMPARISON:  None. FINDINGS: There is a minimally medially displaced, oblique, intra-articular fracture of the head of the left second proximal phalanx. There is hallux valgus deformity with mild hypertrophy of the medial aspect of the first metatarsal head. IMPRESSION: Minimally displaced intra-articular fracture of the left second proximal phalangeal head. Electronically Signed   By: Ulyses Jarred M.D.   On: 01/02/2016 04:51       Assessment & Plan:   Problem List Items Addressed This Visit    Cough    Cough with eating as  outlined.  Zantac as directed.  Follow.        DCIS (ductal carcinoma in situ) of breast    On tamoxifen.   12/13/15 - Birads II.  Followed by Dr Bary Castilla.        Diabetes mellitus ([place])    Follow sugars.  a1c as outlined.  Follow met b and a1c.       Relevant Orders   Hemoglobin L8G   Basic metabolic panel   Microalbumin / creatinine urine ratio   Hypercholesterolemia    On lipitor.  Low cholesterol diet and exercise.  Follow lipid panel and liver function tests.        Relevant Orders   Hepatic function panel   Lipid panel   Hypertension    Blood pressure under good control.  Continue same medication regimen.  Follow pressures.  Follow metabolic panel.        Left leg pain    Seeing Dr Sharlet Salina.  S/p injection x 2.  Pain better.  Follow.        Malignant melanoma of arm ([place])    S/p removal.  Followed by dermatology.        Myasthenia gravis ([place])    Followed by Dr Manuella Ghazi.  Doing well.        Osteopenia    Off evista.  Due bone density.  Schedule.         Other Visit Diagnoses    Estrogen deficiency    -  Primary   Relevant Orders   DG Bone Density       Einar Pheasant, MD

## 2016-07-31 ENCOUNTER — Ambulatory Visit: Payer: No Typology Code available for payment source | Admitting: Internal Medicine

## 2016-08-02 ENCOUNTER — Encounter: Payer: Self-pay | Admitting: Internal Medicine

## 2016-08-02 DIAGNOSIS — R05 Cough: Secondary | ICD-10-CM | POA: Insufficient documentation

## 2016-08-02 DIAGNOSIS — R059 Cough, unspecified: Secondary | ICD-10-CM | POA: Insufficient documentation

## 2016-08-02 NOTE — Assessment & Plan Note (Signed)
Blood pressure under good control.  Continue same medication regimen.  Follow pressures.  Follow metabolic panel.   

## 2016-08-02 NOTE — Assessment & Plan Note (Signed)
S/p removal.  Followed by dermatology.

## 2016-08-02 NOTE — Assessment & Plan Note (Signed)
On tamoxifen.   12/13/15 - Birads II.  Followed by Dr Bary Castilla.

## 2016-08-02 NOTE — Assessment & Plan Note (Signed)
Followed by Dr Manuella Ghazi.  Doing well.

## 2016-08-02 NOTE — Assessment & Plan Note (Signed)
Cough with eating as outlined.  Zantac as directed.  Follow.

## 2016-08-02 NOTE — Assessment & Plan Note (Signed)
Seeing Dr Sharlet Salina.  S/p injection x 2.  Pain better.  Follow.

## 2016-08-02 NOTE — Assessment & Plan Note (Signed)
Off evista.  Due bone density.  Schedule.

## 2016-08-02 NOTE — Assessment & Plan Note (Signed)
On lipitor.  Low cholesterol diet and exercise.  Follow lipid panel and liver function tests.   

## 2016-08-02 NOTE — Assessment & Plan Note (Signed)
Follow sugars.  a1c as outlined.  Follow met b and a1c.

## 2016-08-14 DIAGNOSIS — M8588 Other specified disorders of bone density and structure, other site: Secondary | ICD-10-CM | POA: Diagnosis not present

## 2016-08-14 DIAGNOSIS — M48061 Spinal stenosis, lumbar region without neurogenic claudication: Secondary | ICD-10-CM | POA: Diagnosis not present

## 2016-08-22 ENCOUNTER — Telehealth: Payer: Self-pay

## 2016-08-26 NOTE — Telephone Encounter (Signed)
Need to call patient and let her know that per Dr. Nicki Reaper the bone density reveals osteopenia. Some bone loss but does not meet the criteria for osteoporosis. No significant change form last. Continue as she is doing.  I have called on 6/27 and l/m to call office. Report sent to scan

## 2016-08-29 NOTE — Telephone Encounter (Signed)
Left message to return call to our office.  

## 2016-09-01 NOTE — Telephone Encounter (Signed)
Letter mailed

## 2016-09-01 NOTE — Telephone Encounter (Signed)
Left message to return call to our office.  

## 2016-09-12 ENCOUNTER — Other Ambulatory Visit: Payer: Self-pay | Admitting: Internal Medicine

## 2016-10-05 ENCOUNTER — Other Ambulatory Visit: Payer: Self-pay | Admitting: Internal Medicine

## 2016-10-13 ENCOUNTER — Other Ambulatory Visit: Payer: Self-pay | Admitting: Internal Medicine

## 2016-10-23 ENCOUNTER — Other Ambulatory Visit: Payer: Self-pay

## 2016-10-23 DIAGNOSIS — D0511 Intraductal carcinoma in situ of right breast: Secondary | ICD-10-CM

## 2016-11-18 ENCOUNTER — Other Ambulatory Visit: Payer: Self-pay | Admitting: Internal Medicine

## 2016-11-27 ENCOUNTER — Other Ambulatory Visit (INDEPENDENT_AMBULATORY_CARE_PROVIDER_SITE_OTHER): Payer: Medicare Other

## 2016-11-27 DIAGNOSIS — E119 Type 2 diabetes mellitus without complications: Secondary | ICD-10-CM | POA: Diagnosis not present

## 2016-11-27 DIAGNOSIS — E7800 Pure hypercholesterolemia, unspecified: Secondary | ICD-10-CM | POA: Diagnosis not present

## 2016-11-27 LAB — BASIC METABOLIC PANEL
BUN: 12 mg/dL (ref 6–23)
CHLORIDE: 103 meq/L (ref 96–112)
CO2: 31 mEq/L (ref 19–32)
Calcium: 9.6 mg/dL (ref 8.4–10.5)
Creatinine, Ser: 0.94 mg/dL (ref 0.40–1.20)
GFR: 60.44 mL/min (ref >60.00)
Glucose, Bld: 134 mg/dL — ABNORMAL HIGH (ref 70–99)
POTASSIUM: 3.8 meq/L (ref 3.5–5.1)
SODIUM: 138 meq/L (ref 135–145)

## 2016-11-27 LAB — HEPATIC FUNCTION PANEL
ALT: 8 U/L (ref 0–35)
AST: 13 U/L (ref 0–37)
Albumin: 3.6 g/dL (ref 3.5–5.2)
Alkaline Phosphatase: 53 U/L (ref 39–117)
Bilirubin, Direct: 0.1 mg/dL (ref 0.0–0.3)
TOTAL PROTEIN: 6.6 g/dL (ref 6.0–8.3)
Total Bilirubin: 0.5 mg/dL (ref 0.2–1.2)

## 2016-11-27 LAB — LIPID PANEL
CHOLESTEROL: 128 mg/dL (ref 0–200)
HDL: 40.1 mg/dL (ref >39.00)
LDL CALC: 52 mg/dL (ref 0–99)
NonHDL: 87.53
TRIGLYCERIDES: 180 mg/dL — AB (ref 0.0–149.0)
Total CHOL/HDL Ratio: 3
VLDL: 36 mg/dL (ref 0.0–40.0)

## 2016-11-27 LAB — HEMOGLOBIN A1C: HEMOGLOBIN A1C: 7.5 % — AB (ref 4.6–6.5)

## 2016-11-27 LAB — MICROALBUMIN / CREATININE URINE RATIO
Creatinine,U: 415.9 mg/dL
MICROALB UR: 2.7 mg/dL — AB (ref 0.0–1.9)
MICROALB/CREAT RATIO: 0.6 mg/g (ref 0.0–30.0)

## 2016-11-28 ENCOUNTER — Other Ambulatory Visit: Payer: No Typology Code available for payment source

## 2016-12-01 ENCOUNTER — Encounter: Payer: No Typology Code available for payment source | Admitting: Internal Medicine

## 2016-12-15 ENCOUNTER — Ambulatory Visit (INDEPENDENT_AMBULATORY_CARE_PROVIDER_SITE_OTHER): Payer: Medicare Other | Admitting: Internal Medicine

## 2016-12-15 ENCOUNTER — Ambulatory Visit
Admission: RE | Admit: 2016-12-15 | Discharge: 2016-12-15 | Disposition: A | Discharge status: Home / Self Care | Payer: Medicare Other | Source: Ambulatory Visit | Attending: General Surgery | Admitting: General Surgery

## 2016-12-15 ENCOUNTER — Encounter: Payer: Self-pay | Admitting: Internal Medicine

## 2016-12-15 DIAGNOSIS — I10 Essential (primary) hypertension: Secondary | ICD-10-CM

## 2016-12-15 DIAGNOSIS — D0511 Intraductal carcinoma in situ of right breast: Secondary | ICD-10-CM | POA: Insufficient documentation

## 2016-12-15 DIAGNOSIS — G7000 Myasthenia gravis without (acute) exacerbation: Secondary | ICD-10-CM

## 2016-12-15 DIAGNOSIS — E119 Type 2 diabetes mellitus without complications: Secondary | ICD-10-CM

## 2016-12-15 DIAGNOSIS — Z0000 Encounter for general adult medical examination without abnormal findings: Secondary | ICD-10-CM | POA: Diagnosis not present

## 2016-12-15 DIAGNOSIS — E7800 Pure hypercholesterolemia, unspecified: Secondary | ICD-10-CM

## 2016-12-15 DIAGNOSIS — Z23 Encounter for immunization: Secondary | ICD-10-CM | POA: Diagnosis not present

## 2016-12-15 DIAGNOSIS — C4362 Malignant melanoma of left upper limb, including shoulder: Secondary | ICD-10-CM | POA: Diagnosis not present

## 2016-12-15 DIAGNOSIS — R922 Inconclusive mammogram: Secondary | ICD-10-CM | POA: Diagnosis not present

## 2016-12-15 MED ORDER — TRIAMCINOLONE ACETONIDE 0.1 % EX CREA: 1.0000 "application " | TOPICAL_CREAM | Freq: Two times a day (BID) | CUTANEOUS | 0 refills | Status: DC

## 2016-12-15 NOTE — Progress Notes (Addendum)
Patient ID: Karlene Lineman, female   DOB: Oct 15, 1931, 83 y.o.   MRN: [id number]   Subjective:    Patient ID: Karlene Lineman, female    DOB: 1931-09-30, 83 y.o.   MRN: [id number]  HPI  Patient here for a scheduled follow up.  Has known spinal stenosis.  S/p ESI - Dr Sharlet Salina.  Doing better.  Pain better.  Stays active.  Has seen ortho.  No chest pain.  No sob.  No acid reflux.  No abdominal pain.  Bowels moving.  Discussed labs.  She had questions about her sugar.  a1c 7.5.  Acceptable range given age.  Discussed her increased stress.  She does not feel needs any intervention at this time.     Past Medical History:  Diagnosis Date  . Allergic rhinitis   . Breast cancer ([place]) 2014   RT LUMPECTOMY  . 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 Resected margins negative. Whole breast radiation  . Cancer ([place]) 12-15-12   left upper arm, malignant melanoma Clark level II, Breslow thickness: 0.35 mm.  . Diabetes mellitus ([place])   . Hypercholesterolemia   . Hypertension   . Myasthenia gravis ([place])    ocular  . S/P radiation therapy 2015   BREAST CA   Past Surgical History:  Procedure Laterality Date  . BREAST BIOPSY Right 2014   DCIS  . BREAST LUMPECTOMY Right 2014   Lumpectomy  . BREAST SURGERY Right 2014   lumpectomy  . EYE SURGERY     Caterac both eyes   Family History  Problem Relation Age of Onset  . Skin cancer Father   . Cancer Father        colon  . Colon cancer Brother   . Cancer Brother        colon  . Breast cancer Unknown        niece   Social History   Social History  . Marital status: Divorced    Spouse name: N/A  . Number of children: N/A  . Years of education: N/A   Social History Main Topics  . Smoking status: Never Smoker  . Smokeless tobacco: Never Used  . Alcohol use No  . Drug use: No  . Sexual activity: No   Other Topics Concern  . None   Social History Narrative  . None    Outpatient Encounter Prescriptions as of  12/15/2016  Medication Sig  . ACCU-CHEK AVIVA PLUS test strip USE TO CHECK BLOOD SUGAR TWICE A DAY. DX. E11.9  . ACCU-CHEK SOFTCLIX LANCETS lancets USE TWICE A DAY  . aspirin 81 MG tablet Take 81 mg by mouth daily.  Marland Kitchen atorvastatin (LIPITOR) 20 MG tablet Take 1 tablet (20 mg total) by mouth daily.  . Calcium Carbonate-Vitamin D (CALCIUM 600+D) 600-400 MG-UNIT per tablet Take 1 tablet by mouth 2 (two) times daily.  . hydrochlorothiazide (HYDRODIURIL) 25 MG tablet TAKE 1 TABLET EVERY DAY  . Multiple Vitamins-Minerals (ICAPS AREDS 2) CAPS Take 1 capsule by mouth 2 (two) times daily.  . potassium chloride (K-DUR,KLOR-CON) 10 MEQ tablet TAKE 1 TABLET EVERY DAY  . ramipril (ALTACE) 10 MG capsule TAKE 1 CAPSULE EVERY DAY  . tamoxifen (NOLVADEX) 20 MG tablet TAKE 1 TABLET EVERY DAY  . triamcinolone cream (KENALOG) 0.1 % Apply 1 application topically 2 (two) times daily.  . [DISCONTINUED] guaiFENesin (MUCINEX) 600 MG 12 hr tablet Take 2 tablets (1,200 mg total) by mouth 2 (two) times daily.  . [  DISCONTINUED] potassium chloride (K-DUR) 10 MEQ tablet TAKE 1 TABLET EVERY DAY  . [DISCONTINUED] potassium chloride (K-DUR) 10 MEQ tablet TAKE 1 TABLET EVERY DAY   No facility-administered encounter medications on file as of 12/15/2016.     Review of Systems  Constitutional: Negative for appetite change and unexpected weight change.  HENT: Negative for congestion and sinus pressure.   Respiratory: Negative for cough, chest tightness and shortness of breath.   Cardiovascular: Negative for chest pain, palpitations and leg swelling.  Gastrointestinal: Negative for abdominal pain, diarrhea, nausea and vomiting.  Genitourinary: Negative for difficulty urinating and dysuria.  Musculoskeletal: Negative for joint swelling.       Back is better.    Skin: Negative for color change and rash.  Neurological: Negative for dizziness, light-headedness and headaches.  Psychiatric/Behavioral: Negative for agitation and  dysphoric mood.       Objective:    Physical Exam  Constitutional: She is oriented to person, place, and time. She appears well-developed and well-nourished. No distress.  HENT:  Nose: Nose normal.  Mouth/Throat: Oropharynx is clear and moist.  Eyes: Right eye exhibits no discharge. Left eye exhibits no discharge. No scleral icterus.  Neck: Neck supple. No thyromegaly present.  Cardiovascular: Normal rate and regular rhythm.   Pulmonary/Chest: Breath sounds normal. No accessory muscle usage. No tachypnea. No respiratory distress. She has no decreased breath sounds. She has no wheezes. She has no rhonchi. Right breast exhibits no inverted nipple, no mass, no nipple discharge and no tenderness (no axillary adenopathy). Left breast exhibits no inverted nipple, no mass, no nipple discharge and no tenderness (no axilarry adenopathy).  Abdominal: Soft. Bowel sounds are normal. There is no tenderness.  Musculoskeletal: She exhibits no edema or tenderness.  Lymphadenopathy:    She has no cervical adenopathy.  Neurological: She is alert and oriented to person, place, and time.  Skin: Skin is warm. No rash noted. No erythema.  Psychiatric: She has a normal mood and affect. Her behavior is normal.    BP 132/64 (BP Location: Left Arm, Patient Position: Sitting, Cuff Size: Small)   Pulse 64   Temp 98.6 F (37 C) (Oral)   Resp 14   Ht '5\' 4"'  (1.626 m)   Wt 142 lb 3.2 oz (64.5 kg)   SpO2 96%   BMI 24.41 kg/m  Wt Readings from Last 3 Encounters:  12/15/16 142 lb 3.2 oz (64.5 kg)  07/30/16 141 lb 9.6 oz (64.2 kg)  05/27/16 143 lb 3.2 oz (65 kg)     Lab Results  Component Value Date   WBC 6.5 10/09/2015   HGB 12.0 10/09/2015   HCT 36.8 10/09/2015   PLT 207.0 10/09/2015   GLUCOSE 134 (H) 11/27/2016   CHOL 128 11/27/2016   TRIG 180.0 (H) 11/27/2016   HDL 40.10 11/27/2016   LDLCALC 52 11/27/2016   ALT 8 11/27/2016   AST 13 11/27/2016   NA 138 11/27/2016   K 3.8 11/27/2016   CL 103  11/27/2016   CREATININE 0.94 11/27/2016   BUN 12 11/27/2016   CO2 31 11/27/2016   TSH 0.95 10/09/2015   HGBA1C 7.5 (H) 11/27/2016   MICROALBUR 2.7 (H) 11/27/2016    Dg Foot 2 Views Left  Result Date: 01/02/2016 CLINICAL DATA:  Left second toe swelling for 4 weeks. No known injury. EXAM: LEFT FOOT - 2 VIEW COMPARISON:  None. FINDINGS: There is a minimally medially displaced, oblique, intra-articular fracture of the head of the left second proximal phalanx. There is  hallux valgus deformity with mild hypertrophy of the medial aspect of the first metatarsal head. IMPRESSION: Minimally displaced intra-articular fracture of the left second proximal phalangeal head. Electronically Signed   By: Ulyses Jarred M.D.   On: 01/02/2016 04:51       Assessment & Plan:   Problem List Items Addressed This Visit    DCIS (ductal carcinoma in situ) of breast    On tamoxifen.  Followed by Dr Bary Castilla.        Diabetes mellitus ([place])    Discussed diet and exercise.  She tries to watch what she eats.  a1c just checked 7.5.  Acceptable range given age, etc.  Continue diet and exercise.  Follow met b and a1c.        Relevant Orders   Hemoglobin A1c   Health care maintenance    Physical today 12/15/16.  Has mammogram scheduled for today.        Hypercholesterolemia    On lipitor.  Low cholesterol diet and exercise.  Follow lipid panel and liver function tests.        Relevant Orders   Hepatic function panel   Lipid panel   Hypertension    Blood pressure as outlined.  Same medication regimen.  Follow metabolic panel.        Relevant Orders   CBC with Differential/Platelet   TSH   Basic metabolic panel   Malignant melanoma of arm ([place])    Followed by dermatology.        Myasthenia gravis ([place])    Followed by Dr Manuella Ghazi.  Stable.         Other Visit Diagnoses    Encounter for immunization       Relevant Orders   Flu vaccine HIGH DOSE PF (Completed)       Einar Pheasant, MD

## 2016-12-18 ENCOUNTER — Encounter: Payer: Self-pay | Admitting: Internal Medicine

## 2016-12-18 DIAGNOSIS — Z0000 Encounter for general adult medical examination without abnormal findings: Secondary | ICD-10-CM | POA: Insufficient documentation

## 2016-12-18 NOTE — Assessment & Plan Note (Signed)
On lipitor.  Low cholesterol diet and exercise.  Follow lipid panel and liver function tests.   

## 2016-12-18 NOTE — Assessment & Plan Note (Signed)
Physical today 12/15/16.  Has mammogram scheduled for today.

## 2016-12-18 NOTE — Assessment & Plan Note (Signed)
On tamoxifen.  Followed by Dr Byrnett.   

## 2016-12-18 NOTE — Assessment & Plan Note (Signed)
Discussed diet and exercise.  She tries to watch what she eats.  a1c just checked 7.5.  Acceptable range given age, etc.  Continue diet and exercise.  Follow met b and a1c.

## 2016-12-18 NOTE — Assessment & Plan Note (Signed)
Followed by Dr Shah.  Stable.   

## 2016-12-18 NOTE — Assessment & Plan Note (Signed)
Followed by dermatology

## 2016-12-18 NOTE — Assessment & Plan Note (Signed)
Blood pressure as outlined.  Same medication regimen.  Follow metabolic panel.  

## 2016-12-22 ENCOUNTER — Ambulatory Visit (INDEPENDENT_AMBULATORY_CARE_PROVIDER_SITE_OTHER): Payer: Medicare Other | Admitting: General Surgery

## 2016-12-22 ENCOUNTER — Encounter: Payer: Self-pay | Admitting: General Surgery

## 2016-12-22 VITALS — BP 138/71 | HR 71 | Resp 13 | Ht 64.0 in | Wt 144.0 lb

## 2016-12-22 DIAGNOSIS — D0511 Intraductal carcinoma in situ of right breast: Secondary | ICD-10-CM | POA: Diagnosis not present

## 2016-12-22 NOTE — Progress Notes (Signed)
Patient ID: Cynthia [name], female   DOB: 04-15-1931, 83 y.o.   MRN: [id number]  Chief Complaint  Patient presents with  . Follow-up    mammogram    HPI [name] is a 83 y.o. female who presents for a breast evaluation. The most recent mammogram was done on 12/15/16. Patient does perform regular self breast checks and gets regular mammograms done. She reports no new breast problems.      HPI  Past Medical History:  Diagnosis Date  . Allergic rhinitis   . Breast cancer ([place]) 2014   RT LUMPECTOMY  . 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 Resected margins negative. Whole breast radiation  . Cancer ([place]) 12-15-12   left upper arm, malignant melanoma Clark level II, Breslow thickness: 0.35 mm.  . Diabetes mellitus ([place])   . Hypercholesterolemia   . Hypertension   . Myasthenia gravis ([place])    ocular  . S/P radiation therapy 2015   BREAST CA    Past Surgical History:  Procedure Laterality Date  . BREAST BIOPSY Right 2014   DCIS  . BREAST LUMPECTOMY Right 2014   Lumpectomy  . BREAST SURGERY Right 2014   lumpectomy  . EYE SURGERY     Caterac both eyes    Family History  Problem Relation Age of Onset  . Skin cancer Father   . Cancer Father        colon  . Colon cancer Brother   . Cancer Brother        colon  . Breast cancer Unknown        niece    Social History Social History  Substance Use Topics  . Smoking status: Never Smoker  . Smokeless tobacco: Never Used  . Alcohol use No    Allergies  Allergen Reactions  . No Known Drug Allergy     Current Outpatient Prescriptions  Medication Sig Dispense Refill  . ACCU-CHEK AVIVA PLUS test strip USE TO CHECK BLOOD SUGAR TWICE A DAY. DX. E11.9 100 each 3  . ACCU-CHEK SOFTCLIX LANCETS lancets USE TWICE A DAY 100 each 3  . aspirin 81 MG tablet Take 81 mg by mouth daily.    Marland Kitchen atorvastatin (LIPITOR) 20 MG tablet Take 1 tablet (20 mg total) by mouth daily. 90 tablet 2  . Calcium  Carbonate-Vitamin D (CALCIUM 600+D) 600-400 MG-UNIT per tablet Take 1 tablet by mouth 2 (two) times daily.    . hydrochlorothiazide (HYDRODIURIL) 25 MG tablet TAKE 1 TABLET EVERY DAY 90 tablet 0  . Multiple Vitamins-Minerals (ICAPS AREDS 2) CAPS Take 1 capsule by mouth 2 (two) times daily.    . potassium chloride (K-DUR,KLOR-CON) 10 MEQ tablet TAKE 1 TABLET EVERY DAY 90 tablet 0  . ramipril (ALTACE) 10 MG capsule TAKE 1 CAPSULE EVERY DAY 90 capsule 3  . tamoxifen (NOLVADEX) 20 MG tablet TAKE 1 TABLET EVERY DAY 90 tablet 3  . triamcinolone cream (KENALOG) 0.1 % Apply 1 application topically 2 (two) times daily. 45 g 0   No current facility-administered medications for this visit.     Review of Systems Review of Systems  Constitutional: Negative.   Respiratory: Negative.   Cardiovascular: Negative.     Blood pressure 138/71, pulse 71, resp. rate 13, height 5\' 4"  (1.626 m), weight 144 lb (65.3 kg).  Physical Exam Physical Exam  Constitutional: She is oriented to person, place, and time. She appears well-developed and well-nourished.  Eyes: Conjunctivae are normal.  No scleral icterus.  Neck: Neck supple.  Cardiovascular: Normal rate, regular rhythm and normal heart sounds.   Pulmonary/Chest: Effort normal and breath sounds normal. Right breast exhibits no inverted nipple, no mass, no nipple discharge, no skin change and no tenderness. Left breast exhibits no inverted nipple, no mass, no nipple discharge, no skin change and no tenderness.    Little thickening right breast    Lymphadenopathy:    She has no cervical adenopathy.    She has no axillary adenopathy.  Neurological: She is alert and oriented to person, place, and time.  Skin: Skin is warm and dry.  Psychiatric: She has a normal mood and affect.    Data Reviewed 12/15/2016 bilateral diagnostic mammograms reviewed. BIRAD-2.  Assessment    Stable breast exam.  Good tolerance of antiestrogen therapy. Plans to complete 5  years.    Plan    Follow up in one year with bilateral diagnostic mammograms and office follow up.      HPI, Physical Exam, Assessment and Plan have been scribed under the direction and in the presence of Robert Bellow, MD  Concepcion Living, LPN  I have completed the exam and reviewed the above documentation for accuracy and completeness.  I agree with the above.  Haematologist has been used and any errors in dictation or transcription are unintentional.  Hervey Ard, M.D., F.A.C.S.  Robert Bellow 12/22/2016, 9:25 PM

## 2016-12-22 NOTE — Patient Instructions (Addendum)
Follow up in one year with bilateral diagnostic mammograms and office follow up  Continue self breast exams. Call office for any new breast issues or concerns.

## 2017-01-09 ENCOUNTER — Other Ambulatory Visit: Payer: Self-pay | Admitting: Internal Medicine

## 2017-01-19 ENCOUNTER — Other Ambulatory Visit: Payer: Self-pay | Admitting: Internal Medicine

## 2017-02-27 IMAGING — DX DG TIBIA/FIBULA 2V*L*
4 series · 4 of 4 positions shown · non-contrast
Comparison: Left knee films same date dictated separately.

CLINICAL DATA: 84-year-old female with left hip pain. Initial
encounter.

EXAM:
LEFT TIBIA AND FIBULA - 2 VIEW

[lower leg ap (1 of 2)]
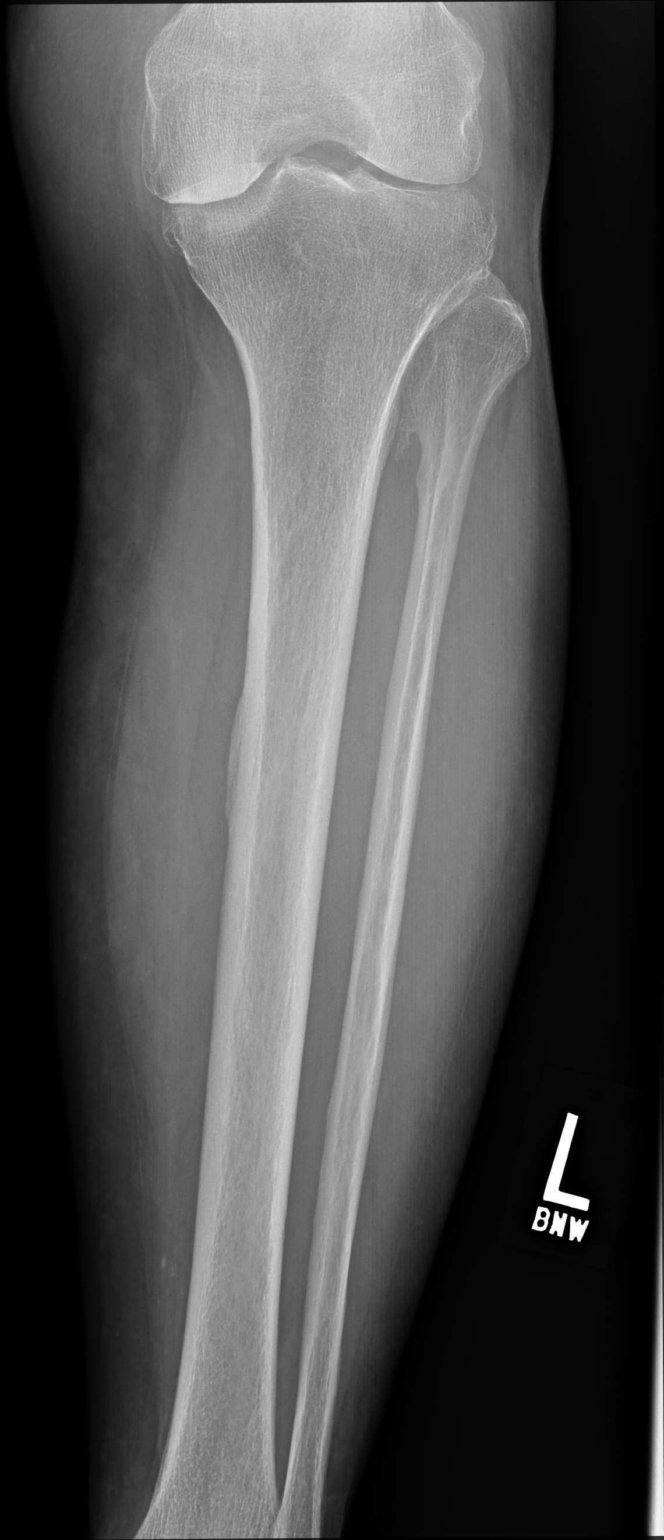

[lower leg ap (2 of 2)]
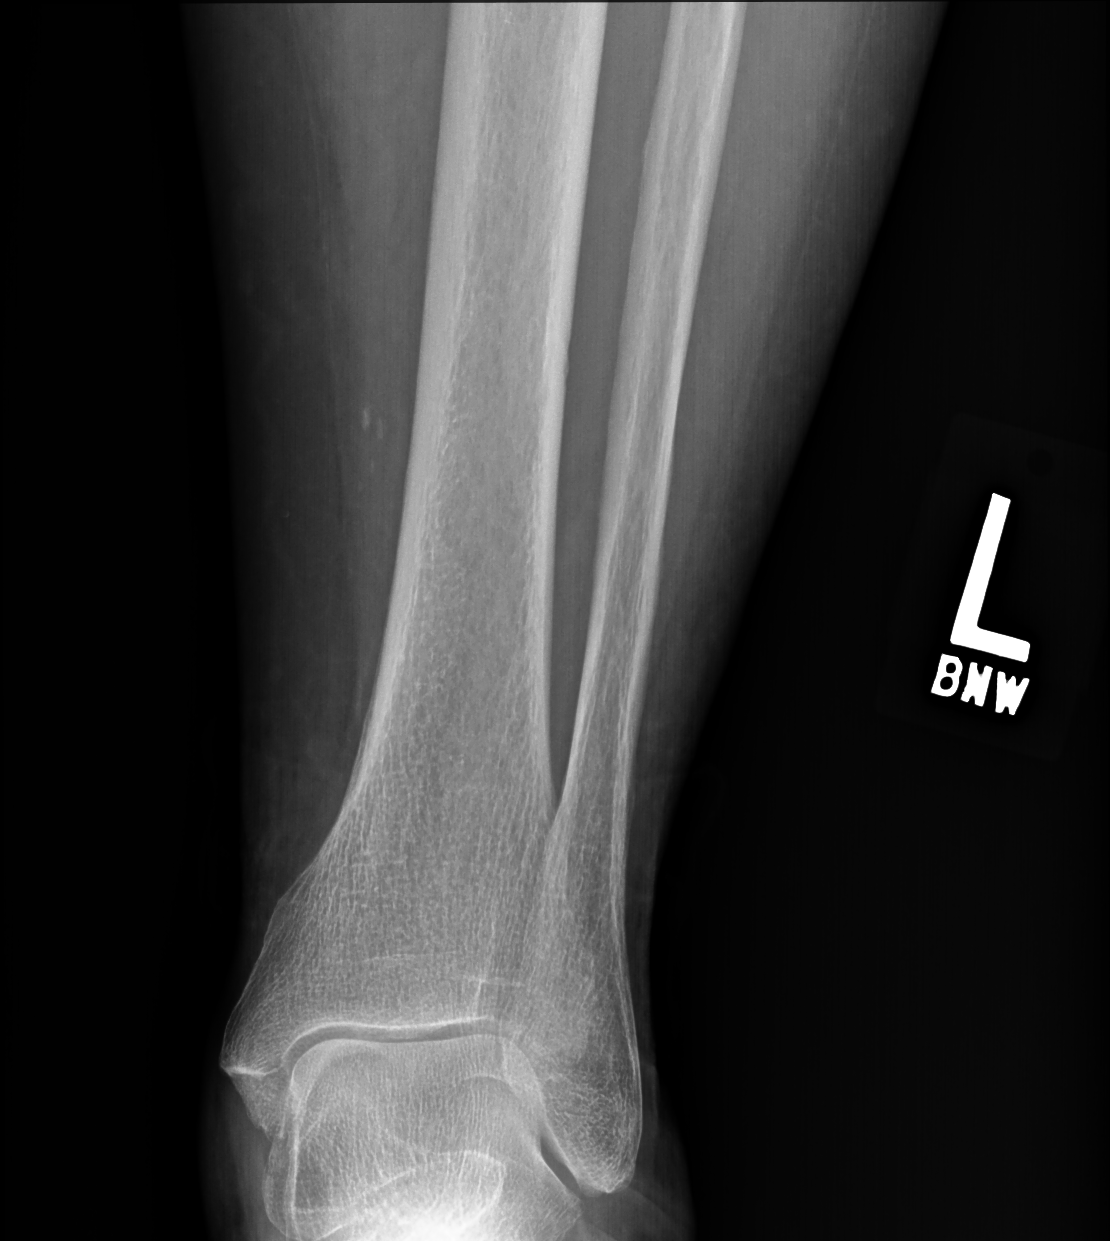

[lower leg lat (1 of 2)]
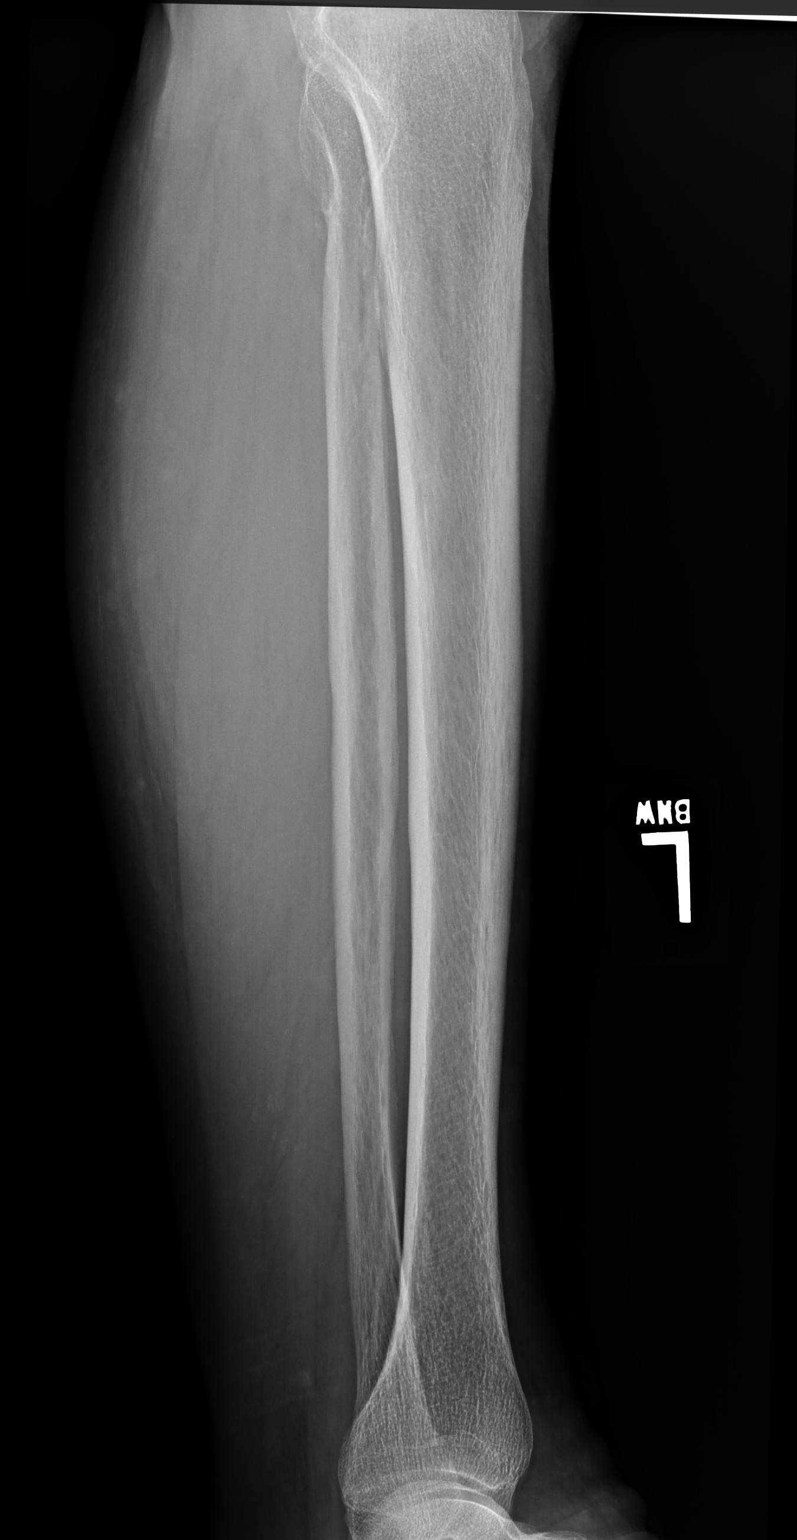

[lower leg lat (2 of 2)]
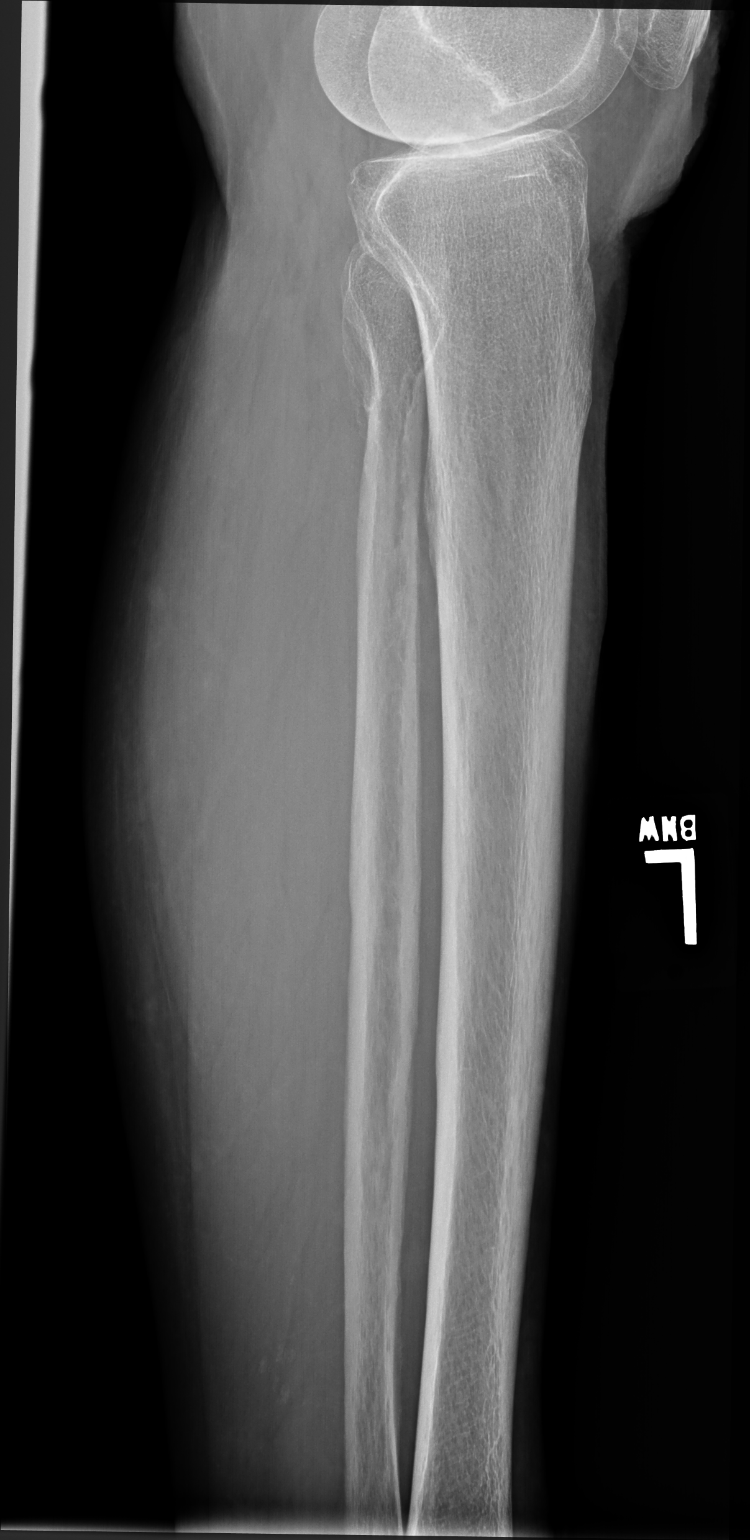

[4 of 4 positions shown; findings below may reference images not displayed]

FINDINGS: No fracture or dislocation.

Medial tibiofemoral joint space degenerative changes.

Bony overgrowth proximal fibular/syndesmosis.

Vascular calcifications.
IMPRESSION: No fracture or dislocation.

## 2017-03-02 ENCOUNTER — Other Ambulatory Visit: Payer: Self-pay | Admitting: Internal Medicine

## 2017-03-16 DIAGNOSIS — H353221 Exudative age-related macular degeneration, left eye, with active choroidal neovascularization: Secondary | ICD-10-CM | POA: Diagnosis not present

## 2017-03-25 DIAGNOSIS — H353221 Exudative age-related macular degeneration, left eye, with active choroidal neovascularization: Secondary | ICD-10-CM | POA: Diagnosis not present

## 2017-04-13 ENCOUNTER — Telehealth: Payer: Self-pay | Admitting: Internal Medicine

## 2017-04-13 DIAGNOSIS — R300 Dysuria: Secondary | ICD-10-CM

## 2017-04-13 NOTE — Telephone Encounter (Signed)
I am ok to check urine.  Her lab appt is not until Thursday.  Does she feel she needs urine checked before this?

## 2017-04-13 NOTE — Telephone Encounter (Signed)
Copied from [place]. Topic: Quick Communication - See Telephone Encounter >> Apr 13, 2017  8:50 AM Robina Ade, Helene Kelp D wrote: CRM for notification. See Telephone encounter for: 04/13/17. Patient called and said she will be in with on Wednesday for labs and wanted to know if she can leave a urine sample also because she feel like she has a UTI. I told patient she would have to see a provider but she said to send message first. Please call patient back, thanks.

## 2017-04-13 NOTE — Telephone Encounter (Signed)
Patient is having burning with urination. Denies any other symptoms and states that she feels fine just burning when she urinates. She has an appointment with you on 2/26 and would like to go ahead and check her urine when she is here for labs.

## 2017-04-14 ENCOUNTER — Other Ambulatory Visit: Payer: Self-pay | Admitting: Internal Medicine

## 2017-04-14 ENCOUNTER — Other Ambulatory Visit: Payer: Self-pay | Admitting: General Surgery

## 2017-04-14 NOTE — Telephone Encounter (Signed)
No, there is an error in the phone note. She is aware that her labs are on Thursday. She is wanting to check urine with labs so results will be in when she has appointment with you next week.

## 2017-04-15 ENCOUNTER — Other Ambulatory Visit: Payer: No Typology Code available for payment source

## 2017-04-15 NOTE — Telephone Encounter (Signed)
Added urine check to labs scheduled.

## 2017-04-15 NOTE — Telephone Encounter (Signed)
Patient aware.

## 2017-04-16 ENCOUNTER — Other Ambulatory Visit (INDEPENDENT_AMBULATORY_CARE_PROVIDER_SITE_OTHER): Payer: Medicare Other

## 2017-04-16 DIAGNOSIS — E7800 Pure hypercholesterolemia, unspecified: Secondary | ICD-10-CM | POA: Diagnosis not present

## 2017-04-16 DIAGNOSIS — E119 Type 2 diabetes mellitus without complications: Secondary | ICD-10-CM

## 2017-04-16 DIAGNOSIS — I10 Essential (primary) hypertension: Secondary | ICD-10-CM

## 2017-04-16 DIAGNOSIS — M1712 Unilateral primary osteoarthritis, left knee: Secondary | ICD-10-CM | POA: Diagnosis not present

## 2017-04-16 DIAGNOSIS — R300 Dysuria: Secondary | ICD-10-CM | POA: Diagnosis not present

## 2017-04-16 DIAGNOSIS — M1711 Unilateral primary osteoarthritis, right knee: Secondary | ICD-10-CM | POA: Diagnosis not present

## 2017-04-16 LAB — LIPID PANEL
Cholesterol: 120 mg/dL (ref 0–200)
HDL: 41 mg/dL (ref >39.00)
LDL Cholesterol: 50 mg/dL (ref 0–99)
NONHDL: 78.7
Total CHOL/HDL Ratio: 3
Triglycerides: 143 mg/dL (ref 0.0–149.0)
VLDL: 28.6 mg/dL (ref 0.0–40.0)

## 2017-04-16 LAB — CBC WITH DIFFERENTIAL/PLATELET
BASOS ABS: 0 10*3/uL (ref 0.0–0.1)
Basophils Relative: 0.5 % (ref 0.0–3.0)
Eosinophils Absolute: 0.3 10*3/uL (ref 0.0–0.7)
Eosinophils Relative: 3 % (ref 0.0–5.0)
HEMATOCRIT: 37.1 % (ref 36.0–46.0)
Hemoglobin: 12.1 g/dL (ref 12.0–15.0)
LYMPHS ABS: 2.4 10*3/uL (ref 0.7–4.0)
LYMPHS PCT: 26.7 % (ref 12.0–46.0)
MCHC: 32.7 g/dL (ref 30.0–36.0)
MCV: 86.3 fl (ref 78.0–100.0)
MONOS PCT: 6.2 % (ref 3.0–12.0)
Monocytes Absolute: 0.6 10*3/uL (ref 0.1–1.0)
NEUTROS PCT: 63.6 % (ref 43.0–77.0)
Neutro Abs: 5.8 10*3/uL (ref 1.4–7.7)
Platelets: 221 10*3/uL (ref 150.0–400.0)
RBC: 4.3 Mil/uL (ref 3.87–5.11)
RDW: 14.1 % (ref 11.5–15.5)
WBC: 9.1 10*3/uL (ref 4.0–10.5)

## 2017-04-16 LAB — HEPATIC FUNCTION PANEL
ALBUMIN: 3.5 g/dL (ref 3.5–5.2)
ALT: 11 U/L (ref 0–35)
AST: 18 U/L (ref 0–37)
Alkaline Phosphatase: 56 U/L (ref 39–117)
Bilirubin, Direct: 0.1 mg/dL (ref 0.0–0.3)
Total Bilirubin: 0.5 mg/dL (ref 0.2–1.2)
Total Protein: 6.7 g/dL (ref 6.0–8.3)

## 2017-04-16 LAB — BASIC METABOLIC PANEL
BUN: 13 mg/dL (ref 6–23)
CHLORIDE: 99 meq/L (ref 96–112)
CO2: 30 meq/L (ref 19–32)
Calcium: 9.7 mg/dL (ref 8.4–10.5)
Creatinine, Ser: 0.99 mg/dL (ref 0.40–1.20)
GFR: 56.87 mL/min — ABNORMAL LOW (ref >60.00)
Glucose, Bld: 130 mg/dL — ABNORMAL HIGH (ref 70–99)
POTASSIUM: 3.9 meq/L (ref 3.5–5.1)
Sodium: 135 mEq/L (ref 135–145)

## 2017-04-16 LAB — TSH: TSH: 1.54 u[IU]/mL (ref 0.35–4.50)

## 2017-04-16 LAB — URINALYSIS, ROUTINE W REFLEX MICROSCOPIC
Bilirubin Urine: NEGATIVE
Hgb urine dipstick: NEGATIVE
Ketones, ur: NEGATIVE
Leukocytes, UA: NEGATIVE
Nitrite: NEGATIVE
PH: 6.5 (ref 5.0–8.0)
RBC / HPF: NONE SEEN (ref 0–?)
SPECIFIC GRAVITY, URINE: 1.015 (ref 1.000–1.030)
Total Protein, Urine: NEGATIVE
UROBILINOGEN UA: 0.2 (ref 0.0–1.0)
Urine Glucose: NEGATIVE

## 2017-04-16 LAB — HEMOGLOBIN A1C: HEMOGLOBIN A1C: 7.4 % — AB (ref 4.6–6.5)

## 2017-04-18 LAB — URINE CULTURE
MICRO NUMBER:: 90230287
SPECIMEN QUALITY:: ADEQUATE

## 2017-04-20 ENCOUNTER — Other Ambulatory Visit: Payer: No Typology Code available for payment source

## 2017-04-21 ENCOUNTER — Encounter: Payer: Self-pay | Admitting: Internal Medicine

## 2017-04-21 ENCOUNTER — Ambulatory Visit (INDEPENDENT_AMBULATORY_CARE_PROVIDER_SITE_OTHER): Payer: Medicare Other | Admitting: Internal Medicine

## 2017-04-21 DIAGNOSIS — F321 Major depressive disorder, single episode, moderate: Secondary | ICD-10-CM | POA: Diagnosis not present

## 2017-04-21 DIAGNOSIS — Z9109 Other allergy status, other than to drugs and biological substances: Secondary | ICD-10-CM

## 2017-04-21 DIAGNOSIS — E119 Type 2 diabetes mellitus without complications: Secondary | ICD-10-CM

## 2017-04-21 DIAGNOSIS — G7000 Myasthenia gravis without (acute) exacerbation: Secondary | ICD-10-CM | POA: Diagnosis not present

## 2017-04-21 DIAGNOSIS — E7800 Pure hypercholesterolemia, unspecified: Secondary | ICD-10-CM | POA: Diagnosis not present

## 2017-04-21 DIAGNOSIS — M79605 Pain in left leg: Secondary | ICD-10-CM

## 2017-04-21 DIAGNOSIS — D0511 Intraductal carcinoma in situ of right breast: Secondary | ICD-10-CM | POA: Diagnosis not present

## 2017-04-21 DIAGNOSIS — I10 Essential (primary) hypertension: Secondary | ICD-10-CM | POA: Diagnosis not present

## 2017-04-21 LAB — HM DIABETES FOOT EXAM

## 2017-04-21 NOTE — Progress Notes (Signed)
Patient ID: Cynthia [name], female   DOB: [birth year], 84 y.o.   MRN: [id number]   Subjective:    Patient ID: Cynthia [name], female    DOB: Jan 15, 1932, 84 y.o.   MRN: [id number]  HPI  Patient here for a scheduled follow up.  She reports she has been having problems with her legs.  Seeing Dr Sabra Heck.  S/p injection - left leg.  Doing better.  Still with some pain in the left leg, but better.  Planning to have right leg injection in the next two weeks.  Tries to stay active.  No chest pain.  No sob.  No acid reflux.  No abdominal pain.  Bowels moving.  States her leg pain and other previous medical issues have her feeling some depression.  States she gets frustrated when she cannot do the things she used to be able to do.  Discussed with her today.  Some reports previously having some suicidal thoughts.  States at times she just doesn't care if she wakes up.  On further discussion, she reports she would not hurt or kill herself, she just gets frustrated at times.  Assures me she is not suicidal and would not hurt herself. We discussed counseling and psychiatry referral.  She declines.  Does not feel she needs.  Discussed earlier f/u with me.  She wants to keep on her regularly scheduled follow ups.  Discussed lab results.     Past Medical History:  Diagnosis Date  . Allergic rhinitis   . Breast cancer ([place]) 2014   RT LUMPECTOMY  . 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 Resected margins negative. Whole breast radiation  . Cancer ([place]) 12-15-12   left upper arm, malignant melanoma Clark level II, Breslow thickness: 0.35 mm.  . Diabetes mellitus ([place])   . Hypercholesterolemia   . Hypertension   . Myasthenia gravis ([place])    ocular  . S/P radiation therapy 2015   BREAST CA   Past Surgical History:  Procedure Laterality Date  . BREAST BIOPSY Right 2014   DCIS  . BREAST LUMPECTOMY Right 2014   Lumpectomy  . BREAST SURGERY Right 2014   lumpectomy  . EYE SURGERY     Caterac both eyes   Family History  Problem Relation Age of Onset  . Skin cancer Father   . Cancer Father        colon  . Colon cancer Brother   . Cancer Brother        colon  . Breast cancer Unknown        niece   Social History   Socioeconomic History  . Marital status: Divorced    Spouse name: None  . Number of children: None  . Years of education: None  . Highest education level: None  Social Needs  . Financial resource strain: None  . Food insecurity - worry: None  . Food insecurity - inability: None  . Transportation needs - medical: None  . Transportation needs - non-medical: None  Occupational History  . None  Tobacco Use  . Smoking status: Never Smoker  . Smokeless tobacco: Never Used  Substance and Sexual Activity  . Alcohol use: No    Alcohol/week: 0.0 oz  . Drug use: No  . Sexual activity: No  Other Topics Concern  . None  Social History Narrative  . None    Outpatient Encounter Medications as of 04/21/2017  Medication Sig  . ACCU-CHEK AVIVA PLUS  test strip USE TO CHECK BLOOD SUGAR TWICE A DAY. DX. E11.9  . ACCU-CHEK SOFTCLIX LANCETS lancets USE TWICE A DAY  . aspirin 81 MG tablet Take 81 mg by mouth daily.  Marland Kitchen atorvastatin (LIPITOR) 20 MG tablet TAKE 1 TABLET (20 MG TOTAL) BY MOUTH DAILY.  . Calcium Carbonate-Vitamin D (CALCIUM 600+D) 600-400 MG-UNIT per tablet Take 1 tablet by mouth 2 (two) times daily.  . hydrochlorothiazide (HYDRODIURIL) 25 MG tablet Take 1 tablet (25 mg total) by mouth daily.  . Multiple Vitamins-Minerals (ICAPS AREDS 2) CAPS Take 1 capsule by mouth 2 (two) times daily.  . potassium chloride (K-DUR,KLOR-CON) 10 MEQ tablet TAKE 1 TABLET EVERY DAY  . ramipril (ALTACE) 10 MG capsule TAKE 1 CAPSULE EVERY DAY  . tamoxifen (NOLVADEX) 20 MG tablet TAKE 1 TABLET EVERY DAY  . triamcinolone cream (KENALOG) 0.1 % Apply 1 application topically 2 (two) times daily.   No facility-administered encounter medications on file as of 04/21/2017.      Review of Systems  Constitutional: Negative for appetite change and unexpected weight change.  HENT: Negative for congestion and sinus pressure.   Respiratory: Negative for cough, chest tightness and shortness of breath.   Cardiovascular: Negative for chest pain, palpitations and leg swelling.  Gastrointestinal: Negative for abdominal pain, diarrhea, nausea and vomiting.  Genitourinary: Negative for difficulty urinating and dysuria.  Musculoskeletal: Negative for joint swelling.       Leg pain as outlined.    Skin: Negative for color change and rash.  Neurological: Negative for dizziness, light-headedness and headaches.  Psychiatric/Behavioral: Negative for agitation.       Increased stress and some depression as outlined.  No suicidal ideations now.         Objective:     Blood pressure rechecked by me:  136/78  Physical Exam  Constitutional: She appears well-developed and well-nourished. No distress.  HENT:  Nose: Nose normal.  Mouth/Throat: Oropharynx is clear and moist.  Neck: Neck supple. No thyromegaly present.  Cardiovascular: Normal rate and regular rhythm.  Pulmonary/Chest: Breath sounds normal. No respiratory distress. She has no wheezes.  Abdominal: Soft. Bowel sounds are normal. There is no tenderness.  Musculoskeletal: She exhibits no edema or tenderness.  Lymphadenopathy:    She has no cervical adenopathy.  Skin: No rash noted. No erythema.  Psychiatric: Her behavior is normal. Thought content normal.    BP 136/78   Pulse 67   Temp 98.8 F (37.1 C) (Oral)   Resp 18   Wt 144 lb 3.2 oz (65.4 kg)   SpO2 97%   BMI 24.75 kg/m  Wt Readings from Last 3 Encounters:  04/21/17 144 lb 3.2 oz (65.4 kg)  12/22/16 144 lb (65.3 kg)  12/15/16 142 lb 3.2 oz (64.5 kg)     Lab Results  Component Value Date   WBC 9.1 04/16/2017   HGB 12.1 04/16/2017   HCT 37.1 04/16/2017   PLT 221.0 04/16/2017   GLUCOSE 130 (H) 04/16/2017   CHOL 120 04/16/2017   TRIG 143.0  04/16/2017   HDL 41.00 04/16/2017   LDLCALC 50 04/16/2017   ALT 11 04/16/2017   AST 18 04/16/2017   NA 135 04/16/2017   K 3.9 04/16/2017   CL 99 04/16/2017   CREATININE 0.99 04/16/2017   BUN 13 04/16/2017   CO2 30 04/16/2017   TSH 1.54 04/16/2017   HGBA1C 7.4 (H) 04/16/2017   MICROALBUR 2.7 (H) 11/27/2016    Mm Diag Breast Tomo Bilateral  Result Date: 12/15/2016  CLINICAL DATA:  Status post right lumpectomy in 2014. Patient takes tamoxifen. EXAM: 2D DIGITAL DIAGNOSTIC BILATERAL MAMMOGRAM WITH CAD AND ADJUNCT TOMO COMPARISON:  12/13/2015 ACR Breast Density Category c: The breast tissue is heterogeneously dense, which may obscure small masses. FINDINGS: Post operative changes are seen in the rightbreast. No suspicious mass, distortion, or microcalcifications are identified to suggest presence of malignancy. Mammographic images were processed with CAD. IMPRESSION: No mammographic evidence for malignancy. RECOMMENDATION: Diagnostic mammogram is suggested in 1 year. (Code:[id number]-B-01Y) I have discussed the findings and recommendations with the patient. Results were also provided in writing at the conclusion of the visit. If applicable, a reminder letter will be sent to the patient regarding the next appointment. BI-RADS CATEGORY  2: Benign. Electronically Signed   By: Nolon Nations M.D.   On: 12/15/2016 15:04       Assessment & Plan:   Problem List Items Addressed This Visit    DCIS (ductal carcinoma in situ) of breast    On tamoxifen.  Followed by Dr Bary Castilla.        Depression, major, single episode, moderate ([place])    Discussed with her today.  Declines suicidal thoughts at this time.  Assures me she would not hurt herself.  Declines medication.  Declines referral to counseling and psychiatry.  Wants to keep regularly scheduled f/u appts with me.  Will notify me if symptoms worsen.        Diabetes mellitus ([place])    Discussed recent labs.  a1c 7.4.  Continue low carb diet and exercise.   Follow met b and a1c.  Up to date with eye checks.        Relevant Orders   Hemoglobin Y1O   Basic metabolic panel   Environmental allergies    Controlled.       Hypercholesterolemia    On lipitor.  Low cholesterol diet and exercise.  Follow lipid panel and liver function tests.        Relevant Orders   Hepatic function panel   Lipid panel   Hypertension    Blood pressure on recheck improved.  Continue same medication regimen.  Follow pressures.  Follow metabolic panel.       Left leg pain    Has seen Dr Sharlet Salina previously.  Now seeing Dr Sabra Heck.  S/p left leg injection recently.  Pain improved.  Planning to return for right leg injection.        Myasthenia gravis [facility])    Has been followed by Dr Manuella Ghazi.  Stable.           Einar Pheasant, MD

## 2017-04-22 ENCOUNTER — Encounter: Payer: Self-pay | Admitting: Internal Medicine

## 2017-04-22 DIAGNOSIS — F32A Depression, unspecified: Secondary | ICD-10-CM | POA: Insufficient documentation

## 2017-04-22 DIAGNOSIS — F320 Major depressive disorder, single episode, mild: Secondary | ICD-10-CM | POA: Insufficient documentation

## 2017-04-22 NOTE — Assessment & Plan Note (Signed)
Has seen Dr Sharlet Salina previously.  Now seeing Dr Sabra Heck.  S/p left leg injection recently.  Pain improved.  Planning to return for right leg injection.

## 2017-04-22 NOTE — Assessment & Plan Note (Signed)
Blood pressure on recheck improved.  Continue same medication regimen.  Follow pressures.  Follow metabolic panel.   

## 2017-04-22 NOTE — Assessment & Plan Note (Signed)
On lipitor.  Low cholesterol diet and exercise.  Follow lipid panel and liver function tests.   

## 2017-04-22 NOTE — Assessment & Plan Note (Signed)
Discussed recent labs.  a1c 7.4.  Continue low carb diet and exercise.  Follow met b and a1c.  Up to date with eye checks.

## 2017-04-22 NOTE — Assessment & Plan Note (Signed)
Discussed with her today.  Declines suicidal thoughts at this time.  Assures me she would not hurt herself.  Declines medication.  Declines referral to counseling and psychiatry.  Wants to keep regularly scheduled f/u appts with me.  Will notify me if symptoms worsen.

## 2017-04-22 NOTE — Assessment & Plan Note (Signed)
On tamoxifen.  Followed by Dr Byrnett.   

## 2017-04-22 NOTE — Assessment & Plan Note (Signed)
Has been followed by Dr Shah.  Stable.  

## 2017-04-22 NOTE — Assessment & Plan Note (Signed)
Controlled.  

## 2017-04-29 DIAGNOSIS — H353221 Exudative age-related macular degeneration, left eye, with active choroidal neovascularization: Secondary | ICD-10-CM | POA: Diagnosis not present

## 2017-05-18 ENCOUNTER — Other Ambulatory Visit: Payer: Self-pay | Admitting: Internal Medicine

## 2017-05-20 DIAGNOSIS — M1711 Unilateral primary osteoarthritis, right knee: Secondary | ICD-10-CM | POA: Diagnosis not present

## 2017-05-20 DIAGNOSIS — M1712 Unilateral primary osteoarthritis, left knee: Secondary | ICD-10-CM | POA: Diagnosis not present

## 2017-05-20 NOTE — Telephone Encounter (Signed)
Copied from [place]. Topic: Quick Communication - Rx Refill/Question >> May 20, 2017 10:36 AM Bea Graff, NT wrote: Medication: ACCU-CHEK AVIVA PLUS test strip  Has the patient contacted their pharmacy? Yes.   (Agent: If no, request that the patient contact the pharmacy for the refill.) Preferred Pharmacy (with phone number or street name): [facility] on [address]. Pt is out of the strips Agent: Please be advised that RX refills may take up to 3 business days. We ask that you follow-up with your pharmacy.

## 2017-05-21 ENCOUNTER — Ambulatory Visit: Payer: No Typology Code available for payment source

## 2017-06-02 DIAGNOSIS — H353221 Exudative age-related macular degeneration, left eye, with active choroidal neovascularization: Secondary | ICD-10-CM | POA: Diagnosis not present

## 2017-06-11 ENCOUNTER — Other Ambulatory Visit: Payer: Self-pay | Admitting: Internal Medicine

## 2017-06-23 ENCOUNTER — Ambulatory Visit (INDEPENDENT_AMBULATORY_CARE_PROVIDER_SITE_OTHER): Payer: Medicare Other

## 2017-06-23 VITALS — BP 110/62 | HR 69 | Temp 98.0°F | Resp 14 | Ht 63.5 in | Wt 142.0 lb

## 2017-06-23 DIAGNOSIS — Z0000 Encounter for general adult medical examination without abnormal findings: Secondary | ICD-10-CM | POA: Diagnosis not present

## 2017-06-23 NOTE — Patient Instructions (Addendum)
  Cynthia [name] , Thank you for taking time to come for your Medicare Wellness Visit. I appreciate your ongoing commitment to your health goals. Please review the following plan we discussed and let me know if I can assist you in the future.   These are the goals we discussed: Goals    . INCREASE LEAN PROTEINS     Low carb diet       This is a list of the screening recommended for you and due dates:  Health Maintenance  Topic Date Due  . Tetanus Vaccine  11/07/1952  . Pneumonia vaccines (2 of 2 - PPSV23) 11/24/2014  . Flu Shot  09/24/2017  . Hemoglobin A1C  10/14/2017  . Mammogram  12/15/2017  . Complete foot exam   04/21/2018  . Eye exam for diabetics  05/07/2018  . DEXA scan (bone density measurement)  Completed

## 2017-06-23 NOTE — Progress Notes (Addendum)
Subjective:   Cynthia [name] is a 84 y.o. female who presents for Medicare Annual (Subsequent) preventive examination.  Review of Systems:  No ROS.  Medicare Wellness Visit. Additional risk factors are reflected in the social history.  Cardiac Risk Factors include: advanced age (>48men, >45 women);hypertension;diabetes mellitus     Objective:     Vitals: BP 110/62 (BP Location: Right Arm, Patient Position: Sitting, Cuff Size: Normal)   Pulse 69   Temp 98 F (36.7 C) (Oral)   Resp 14   Ht 5' 3.5" (1.613 m)   Wt 142 lb (64.4 kg)   SpO2 98%   BMI 24.76 kg/m   Body mass index is 24.76 kg/m.  Advanced Directives 06/23/2017 05/20/2016  Does Patient Have a Medical Advance Directive? Yes Yes  Type of Paramedic of [place];Living will Living will;Healthcare Power of Attorney  Does patient want to make changes to medical advance directive? No - Patient declined No - Patient declined  Copy of [place] in Chart? No - copy requested No - copy requested    Tobacco Social History   Tobacco Use  Smoking Status Never Smoker  Smokeless Tobacco Never Used     Counseling given: Not Answered   Clinical Intake:  Pre-visit preparation completed: Yes  Pain : No/denies pain     Nutritional Status: BMI of 19-24  Normal Diabetes: Yes(Followed by pcp)  How often do you need to have someone help you when you read instructions, pamphlets, or other written materials from your doctor or pharmacy?: 1 - Never  Interpreter Needed?: No     Past Medical History:  Diagnosis Date  . Allergic rhinitis   . Breast cancer ([place]) 2014   RT LUMPECTOMY  . 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 Resected margins negative. Whole breast radiation  . Cancer ([place]) 12-15-12   left upper arm, malignant melanoma Clark level II, Breslow thickness: 0.35 mm.  . Diabetes mellitus ([place])   . Hypercholesterolemia   . Hypertension   .  Myasthenia gravis ([place])    ocular  . S/P radiation therapy 2015   BREAST CA   Past Surgical History:  Procedure Laterality Date  . BREAST BIOPSY Right 2014   DCIS  . BREAST LUMPECTOMY Right 2014   Lumpectomy  . BREAST SURGERY Right 2014   lumpectomy  . EYE SURGERY     Caterac both eyes   Family History  Problem Relation Age of Onset  . Skin cancer Father   . Cancer Father        colon  . Colon cancer Brother   . Cancer Brother        colon  . Breast cancer Unknown        niece   Social History   Socioeconomic History  . Marital status: Divorced    Spouse name: Not on file  . Number of children: Not on file  . Years of education: Not on file  . Highest education level: Not on file  Occupational History  . Not on file  Social Needs  . Financial resource strain: Not hard at all  . Food insecurity:    Worry: Never true    Inability: Never true  . Transportation needs:    Medical: No    Non-medical: No  Tobacco Use  . Smoking status: Never Smoker  . Smokeless tobacco: Never Used  Substance and Sexual Activity  . Alcohol use: No  Alcohol/week: 0.0 oz  . Drug use: No  . Sexual activity: Never  Lifestyle  . Physical activity:    Days per week: 0 days    Minutes per session: Not on file  . Stress: Not at all  Relationships  . Social connections:    Talks on phone: Not on file    Gets together: Not on file    Attends religious service: Not on file    Active member of club or organization: Not on file    Attends meetings of clubs or organizations: Not on file    Relationship status: Not on file  Other Topics Concern  . Not on file  Social History Narrative  . Not on file    Outpatient Encounter Medications as of 06/23/2017  Medication Sig  . ACCU-CHEK AVIVA PLUS test strip USE TO CHECK BLOOD SUGAR TWICE A DAY. DX. E11.9  . ACCU-CHEK SOFTCLIX LANCETS lancets USE TWICE A DAY  . aspirin 81 MG tablet Take 81 mg by mouth daily.  Marland Kitchen atorvastatin (LIPITOR)  20 MG tablet TAKE 1 TABLET (20 MG TOTAL) BY MOUTH DAILY.  . Calcium Carbonate-Vitamin D (CALCIUM 600+D) 600-400 MG-UNIT per tablet Take 1 tablet by mouth 2 (two) times daily.  . hydrochlorothiazide (HYDRODIURIL) 25 MG tablet TAKE 1 TABLET DAILY.  . Multiple Vitamins-Minerals (ICAPS AREDS 2) CAPS Take 1 capsule by mouth 2 (two) times daily.  . potassium chloride (K-DUR,KLOR-CON) 10 MEQ tablet TAKE 1 TABLET EVERY DAY  . ramipril (ALTACE) 10 MG capsule TAKE 1 CAPSULE EVERY DAY  . tamoxifen (NOLVADEX) 20 MG tablet TAKE 1 TABLET EVERY DAY  . triamcinolone cream (KENALOG) 0.1 % Apply 1 application topically 2 (two) times daily.   No facility-administered encounter medications on file as of 06/23/2017.     Activities of Daily Living In your present state of health, do you have any difficulty performing the following activities: 06/23/2017  Hearing? N  Vision? N  Difficulty concentrating or making decisions? N  Walking or climbing stairs? N  Dressing or bathing? N  Doing errands, shopping? N  Preparing Food and eating ? N  Using the Toilet? N  In the past six months, have you accidently leaked urine? N  Do you have problems with loss of bowel control? N  Managing your Medications? N  Managing your Finances? N  Housekeeping or managing your Housekeeping? N  Some recent data might be hidden    Patient Care Team: Einar Pheasant, MD as PCP - General (Internal Medicine) Bary Castilla, Forest Gleason, MD (General Surgery) Einar Pheasant, MD (Internal Medicine) Barrie Dunker, MD (Dermatology)    Assessment:   This is a routine wellness examination for [place].  The goal of the wellness visit is to assist the patient how to close the gaps in care and create a preventative care plan for the patient.   The roster of all physicians providing medical care to patient is listed in the Snapshot section of the chart.  Taking calcium VIT D as appropriate/Osteoporosis risk reviewed.    Safety issues  reviewed; Lives alone.  Smoke and carbon monoxide detectors in the home. No firearms in the home. Wears seatbelts when driving or riding with others. No violence in the home.  Information provided on life alert.   They do not have excessive sun exposure.  Discussed the need for sun protection: hats, long sleeves and the use of sunscreen if there is significant sun exposure.  Patient is alert, normal appearance, oriented to person/place/and time.  Correctly identified the president of the [place] and recited all the books in the bible per her request. Performs simple calculations and can read correct time from watch face. Displays appropriate judgement.  No new identified risk were noted.  No failures at ADL's or IADL's.    BMI- discussed the importance of a healthy diet, water intake and the benefits of aerobic exercise. Educational material provided.   24 hour diet recall: Regular diet.  Dental- dentures.  Eye- Visual acuity not assessed per patient preference since they have regular follow up with the ophthalmologist.  Wears corrective lenses.  Sleep patterns- Sleeps 4-5 hours at night.  Naps during the day as needed.    Pneumococcal vaccine deferred per patient preference.   TDAP vaccine deferred per patient preference.  Follow up with insurance.  Educational material provided.  Patient Concerns: None at this time. Follow up with PCP as needed.  Exercise Activities and Dietary recommendations Current Exercise Habits: Home exercise routine, Time (Minutes): 20, Frequency (Times/Week): 4, Weekly Exercise (Minutes/Week): 80, Intensity: Mild  Goals    . INCREASE LEAN PROTEINS     Low carb diet       Fall Risk Fall Risk  06/23/2017 05/20/2016 03/11/2016 11/15/2013 11/15/2013  Falls in the past year? No Yes Yes No No  Number falls in past yr: - 1 2 or more - -  Injury with Fall? - No Yes - -  Risk for fall due to : - - History of fall(s) - -  Follow up - Falls prevention discussed  - - -   Depression Screen PHQ 2/9 Scores 06/23/2017 05/20/2016 03/11/2016 11/15/2013  PHQ - 2 Score 0 0 0 0     Cognitive Function     6CIT Screen 06/23/2017 05/20/2016  What Year? 0 points 0 points  What month? 0 points 0 points  What time? 0 points 0 points  Count back from 20 0 points 0 points  Months in reverse 0 points 0 points  Repeat phrase 0 points 0 points  Total Score 0 0    Immunization History  Administered Date(s) Administered  . Influenza Split 12/20/2011  . Influenza, High Dose Seasonal PF 12/15/2016  . Influenza,inj,Quad PF,6+ Mos 11/08/2012, 11/23/2013, 12/14/2014  . Influenza-Unspecified 12/14/2014, 12/10/2015  . Pneumococcal Conjugate-13 11/23/2013   Screening Tests Health Maintenance  Topic Date Due  . TETANUS/TDAP  11/07/1952  . PNA vac Low Risk Adult (2 of 2 - PPSV23) 11/24/2014  . INFLUENZA VACCINE  09/24/2017  . HEMOGLOBIN A1C  10/14/2017  . MAMMOGRAM  12/15/2017  . FOOT EXAM  04/21/2018  . OPHTHALMOLOGY EXAM  05/07/2018  . DEXA SCAN  Completed      Plan:   End of life planning; Advance aging; Advanced directives discussed. Copy of current HCPOA/Living Will requested.    I have personally reviewed and noted the following in the patient's chart:   . Medical and social history . Use of alcohol, tobacco or illicit drugs  . Current medications and supplements . Functional ability and status . Nutritional status . Physical activity . Advanced directives . List of other physicians . Hospitalizations, surgeries, and ER visits in previous 12 months . Vitals . Screenings to include cognitive, depression, and falls . Referrals and appointments  In addition, I have reviewed and discussed with patient certain preventive protocols, quality metrics, and best practice recommendations. A written personalized care plan for preventive services as well as general preventive health recommendations were provided to patient.  Varney Biles,  LPN  [id number]    Reviewed above information.  Agree with assessment and plan.    Dr Nicki Reaper

## 2017-07-11 ENCOUNTER — Other Ambulatory Visit: Payer: Self-pay | Admitting: Internal Medicine

## 2017-07-14 DIAGNOSIS — H353221 Exudative age-related macular degeneration, left eye, with active choroidal neovascularization: Secondary | ICD-10-CM | POA: Diagnosis not present

## 2017-07-17 DIAGNOSIS — M170 Bilateral primary osteoarthritis of knee: Secondary | ICD-10-CM | POA: Diagnosis not present

## 2017-07-17 DIAGNOSIS — M25561 Pain in right knee: Secondary | ICD-10-CM | POA: Diagnosis not present

## 2017-07-17 DIAGNOSIS — M25562 Pain in left knee: Secondary | ICD-10-CM | POA: Diagnosis not present

## 2017-07-28 ENCOUNTER — Ambulatory Visit: Payer: Medicare Other

## 2017-08-04 DIAGNOSIS — M170 Bilateral primary osteoarthritis of knee: Secondary | ICD-10-CM | POA: Diagnosis not present

## 2017-08-11 DIAGNOSIS — M170 Bilateral primary osteoarthritis of knee: Secondary | ICD-10-CM | POA: Diagnosis not present

## 2017-08-18 ENCOUNTER — Other Ambulatory Visit: Payer: Self-pay | Admitting: Internal Medicine

## 2017-08-18 DIAGNOSIS — M170 Bilateral primary osteoarthritis of knee: Secondary | ICD-10-CM | POA: Diagnosis not present

## 2017-08-20 ENCOUNTER — Other Ambulatory Visit (INDEPENDENT_AMBULATORY_CARE_PROVIDER_SITE_OTHER): Payer: Medicare Other

## 2017-08-20 DIAGNOSIS — E119 Type 2 diabetes mellitus without complications: Secondary | ICD-10-CM | POA: Diagnosis not present

## 2017-08-20 DIAGNOSIS — E7800 Pure hypercholesterolemia, unspecified: Secondary | ICD-10-CM | POA: Diagnosis not present

## 2017-08-20 LAB — LIPID PANEL
CHOL/HDL RATIO: 3
Cholesterol: 116 mg/dL (ref 0–200)
HDL: 43.6 mg/dL (ref >39.00)
LDL Cholesterol: 49 mg/dL (ref 0–99)
NonHDL: 72.58
TRIGLYCERIDES: 119 mg/dL (ref 0.0–149.0)
VLDL: 23.8 mg/dL (ref 0.0–40.0)

## 2017-08-20 LAB — HEPATIC FUNCTION PANEL
ALBUMIN: 3.6 g/dL (ref 3.5–5.2)
ALK PHOS: 55 U/L (ref 39–117)
ALT: 11 U/L (ref 0–35)
AST: 19 U/L (ref 0–37)
Bilirubin, Direct: 0.2 mg/dL (ref 0.0–0.3)
TOTAL PROTEIN: 6.6 g/dL (ref 6.0–8.3)
Total Bilirubin: 0.6 mg/dL (ref 0.2–1.2)

## 2017-08-20 LAB — BASIC METABOLIC PANEL
BUN: 12 mg/dL (ref 6–23)
CALCIUM: 9.4 mg/dL (ref 8.4–10.5)
CO2: 31 meq/L (ref 19–32)
Chloride: 100 mEq/L (ref 96–112)
Creatinine, Ser: 0.96 mg/dL (ref 0.40–1.20)
GFR: 58.88 mL/min — ABNORMAL LOW (ref >60.00)
Glucose, Bld: 136 mg/dL — ABNORMAL HIGH (ref 70–99)
Potassium: 3.7 mEq/L (ref 3.5–5.1)
SODIUM: 137 meq/L (ref 135–145)

## 2017-08-20 LAB — HEMOGLOBIN A1C: Hgb A1c MFr Bld: 7.3 % — ABNORMAL HIGH (ref 4.6–6.5)

## 2017-08-24 ENCOUNTER — Encounter: Payer: Self-pay | Admitting: Internal Medicine

## 2017-08-24 ENCOUNTER — Ambulatory Visit (INDEPENDENT_AMBULATORY_CARE_PROVIDER_SITE_OTHER): Payer: Medicare Other | Admitting: Internal Medicine

## 2017-08-24 VITALS — BP 140/78 | HR 66 | Temp 98.3°F | Resp 18 | Wt 139.2 lb

## 2017-08-24 DIAGNOSIS — F321 Major depressive disorder, single episode, moderate: Secondary | ICD-10-CM

## 2017-08-24 DIAGNOSIS — M25569 Pain in unspecified knee: Secondary | ICD-10-CM

## 2017-08-24 DIAGNOSIS — E119 Type 2 diabetes mellitus without complications: Secondary | ICD-10-CM | POA: Diagnosis not present

## 2017-08-24 DIAGNOSIS — I10 Essential (primary) hypertension: Secondary | ICD-10-CM

## 2017-08-24 DIAGNOSIS — D0511 Intraductal carcinoma in situ of right breast: Secondary | ICD-10-CM

## 2017-08-24 DIAGNOSIS — G7000 Myasthenia gravis without (acute) exacerbation: Secondary | ICD-10-CM | POA: Diagnosis not present

## 2017-08-24 DIAGNOSIS — E7800 Pure hypercholesterolemia, unspecified: Secondary | ICD-10-CM

## 2017-08-24 MED ORDER — TRAZODONE HCL 50 MG PO TABS: 50.0000 mg | ORAL_TABLET | Freq: Every day | ORAL | 2 refills | Status: DC

## 2017-08-24 NOTE — Progress Notes (Signed)
Patient ID: Cynthia [name], female   DOB: 11-12-1931, 84 y.o.   MRN: [id number]    Subjective:    Patient ID: Cynthia [name], female    DOB: 05-10-1931, 84 y.o.   MRN: [id number]  HPI  Patient here for a scheduled follow up.  Discussed labs.  No chest pain.  No sob.  No acid reflux.  No abdominal pain.  Bowels moving.  Increased knee pain.  Seeing ortho.  Has f/u scheduled.  Increased stress.  Discussed with her today.  She feels she needs something to help her sleep.  Has tried trazodone previously.  States neurology prescribed.  She request refill.  Tolerated.  No adverse effects.  Does not feel needs anything more or any further intervention. No suicidal ideations.     Past Medical History:  Diagnosis Date  . Allergic rhinitis   . Breast cancer ([employer]) 2014   RT LUMPECTOMY  . 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 Resected margins negative. Whole breast radiation  . Cancer ([place]) 12-15-12   left upper arm, malignant melanoma Clark level II, Breslow thickness: 0.35 mm.  . Diabetes mellitus ([place])   . Hypercholesterolemia   . Hypertension   . Myasthenia gravis ([place])    ocular  . S/P radiation therapy 2015   BREAST CA   Past Surgical History:  Procedure Laterality Date  . BREAST BIOPSY Right 2014   DCIS  . BREAST LUMPECTOMY Right 2014   Lumpectomy  . BREAST SURGERY Right 2014   lumpectomy  . EYE SURGERY     Caterac both eyes   Family History  Problem Relation Age of Onset  . Skin cancer Father   . Cancer Father        colon  . Colon cancer Brother   . Cancer Brother        colon  . Breast cancer Unknown        niece   Social History   Socioeconomic History  . Marital status: Divorced    Spouse name: Not on file  . Number of children: Not on file  . Years of education: Not on file  . Highest education level: Not on file  Occupational History  . Not on file  Social Needs  . Financial resource strain: Not hard at all  . Food insecurity:   Worry: Never true    Inability: Never true  . Transportation needs:    Medical: No    Non-medical: No  Tobacco Use  . Smoking status: Never Smoker  . Smokeless tobacco: Never Used  Substance and Sexual Activity  . Alcohol use: No    Alcohol/week: 0.0 oz  . Drug use: No  . Sexual activity: Never  Lifestyle  . Physical activity:    Days per week: 0 days    Minutes per session: Not on file  . Stress: Not at all  Relationships  . Social connections:    Talks on phone: Not on file    Gets together: Not on file    Attends religious service: Not on file    Active member of club or organization: Not on file    Attends meetings of clubs or organizations: Not on file    Relationship status: Not on file  Other Topics Concern  . Not on file  Social History Narrative  . Not on file    Outpatient Encounter Medications as of 08/24/2017  Medication Sig  . ACCU-CHEK AVIVA PLUS  test strip USE TO CHECK BLOOD SUGAR TWICE A DAY. DX. E11.9  . ACCU-CHEK SOFTCLIX LANCETS lancets USE TWICE A DAY  . aspirin 81 MG tablet Take 81 mg by mouth daily.  Marland Kitchen atorvastatin (LIPITOR) 20 MG tablet TAKE 1 TABLET (20 MG TOTAL) BY MOUTH DAILY.  . Calcium Carbonate-Vitamin D (CALCIUM 600+D) 600-400 MG-UNIT per tablet Take 1 tablet by mouth 2 (two) times daily.  . hydrochlorothiazide (HYDRODIURIL) 25 MG tablet TAKE 1 TABLET EVERY DAY  . Multiple Vitamins-Minerals (ICAPS AREDS 2) CAPS Take 1 capsule by mouth 2 (two) times daily.  . potassium chloride (K-DUR,KLOR-CON) 10 MEQ tablet TAKE 1 TABLET EVERY DAY  . ramipril (ALTACE) 10 MG capsule TAKE 1 CAPSULE EVERY DAY  . tamoxifen (NOLVADEX) 20 MG tablet TAKE 1 TABLET EVERY DAY  . traZODone (DESYREL) 50 MG tablet Take 1 tablet (50 mg total) by mouth at bedtime.  . triamcinolone cream (KENALOG) 0.1 % Apply 1 application topically 2 (two) times daily.  . [DISCONTINUED] potassium chloride (K-DUR,KLOR-CON) 10 MEQ tablet TAKE 1 TABLET EVERY DAY  . [DISCONTINUED] traZODone  (DESYREL) 50 MG tablet Take 50 mg by mouth at bedtime.   No facility-administered encounter medications on file as of 08/24/2017.     Review of Systems  Constitutional: Negative for appetite change and unexpected weight change.  HENT: Negative for congestion and sinus pressure.   Respiratory: Negative for cough, chest tightness and shortness of breath.   Cardiovascular: Negative for chest pain, palpitations and leg swelling.  Gastrointestinal: Negative for abdominal pain, diarrhea, nausea and vomiting.  Genitourinary: Negative for difficulty urinating and dysuria.  Musculoskeletal: Negative for myalgias.       Knee pain as outlined.    Skin: Negative for color change and rash.  Neurological: Negative for dizziness, light-headedness and headaches.  Psychiatric/Behavioral: Negative for agitation and dysphoric mood.       Objective:    Physical Exam  Constitutional: She appears well-developed and well-nourished. No distress.  HENT:  Nose: Nose normal.  Mouth/Throat: Oropharynx is clear and moist.  Neck: Neck supple. No thyromegaly present.  Cardiovascular: Normal rate and regular rhythm.  Pulmonary/Chest: Breath sounds normal. No respiratory distress. She has no wheezes.  Abdominal: Soft. Bowel sounds are normal. There is no tenderness.  Musculoskeletal: She exhibits no edema or tenderness.  Lymphadenopathy:    She has no cervical adenopathy.  Skin: No rash noted. No erythema.  Psychiatric: She has a normal mood and affect. Her behavior is normal.    BP 140/78 (BP Location: Left Arm, Patient Position: Sitting, Cuff Size: Normal)   Pulse 66   Temp 98.3 F (36.8 C) (Oral)   Resp 18   Wt 139 lb 3.2 oz (63.1 kg)   SpO2 99%   BMI 24.27 kg/m  Wt Readings from Last 3 Encounters:  08/24/17 139 lb 3.2 oz (63.1 kg)  06/23/17 142 lb (64.4 kg)  04/21/17 144 lb 3.2 oz (65.4 kg)     Lab Results  Component Value Date   WBC 9.1 04/16/2017   HGB 12.1 04/16/2017   HCT 37.1  04/16/2017   PLT 221.0 04/16/2017   GLUCOSE 136 (H) 08/20/2017   CHOL 116 08/20/2017   TRIG 119.0 08/20/2017   HDL 43.60 08/20/2017   LDLCALC 49 08/20/2017   ALT 11 08/20/2017   AST 19 08/20/2017   NA 137 08/20/2017   K 3.7 08/20/2017   CL 100 08/20/2017   CREATININE 0.96 08/20/2017   BUN 12 08/20/2017   CO2 31 08/20/2017  TSH 1.54 04/16/2017   HGBA1C 7.3 (H) 08/20/2017   MICROALBUR 2.7 (H) 11/27/2016    Mm Diag Breast Tomo Bilateral  Result Date: 12/15/2016 CLINICAL DATA:  Status post right lumpectomy in 2014. Patient takes tamoxifen. EXAM: 2D DIGITAL DIAGNOSTIC BILATERAL MAMMOGRAM WITH CAD AND ADJUNCT TOMO COMPARISON:  12/13/2015 ACR Breast Density Category c: The breast tissue is heterogeneously dense, which may obscure small masses. FINDINGS: Post operative changes are seen in the rightbreast. No suspicious mass, distortion, or microcalcifications are identified to suggest presence of malignancy. Mammographic images were processed with CAD. IMPRESSION: No mammographic evidence for malignancy. RECOMMENDATION: Diagnostic mammogram is suggested in 1 year. (Code:DM-B-01Y) I have discussed the findings and recommendations with the patient. Results were also provided in writing at the conclusion of the visit. If applicable, a reminder letter will be sent to the patient regarding the next appointment. BI-RADS CATEGORY  2: Benign. Electronically Signed   By: Nolon Nations M.D.   On: 12/15/2016 15:04       Assessment & Plan:   Problem List Items Addressed This Visit    DCIS (ductal carcinoma in situ) of breast    On tamoxifen.  Followed by Dr Bary Castilla.        Depression, major, single episode, moderate ([facility])    Increased stress.  No suicidal thoughts.  Trazodone to help her sleep.  Follow.        Relevant Medications   traZODone (DESYREL) 50 MG tablet   Diabetes mellitus ([place])    Discussed recent lab results.  Discussed low carb diet.  Follow met b and a1c.  No low sugars.         Relevant Orders   Hemoglobin O1H   Basic metabolic panel   Microalbumin / creatinine urine ratio   Hypercholesterolemia    On lipitor.  Low cholesterol diet and exercise.  Follow lipid panel and liver function tests.        Relevant Orders   Hepatic function panel   Lipid panel   Hypertension    Blood pressure on recheck improved.  Continue current medication regimen.  Follow pressures.  Follow metabolic panel.        Myasthenia gravis [facility])    Has been followed by Dr Manuella Ghazi.        Relevant Medications   traZODone (DESYREL) 50 MG tablet    Other Visit Diagnoses    Knee pain, unspecified chronicity, unspecified laterality    -  Primary   Seeing ortho.  Follow.         Einar Pheasant, MD

## 2017-08-25 DIAGNOSIS — H353221 Exudative age-related macular degeneration, left eye, with active choroidal neovascularization: Secondary | ICD-10-CM | POA: Diagnosis not present

## 2017-08-30 ENCOUNTER — Encounter: Payer: Self-pay | Admitting: Internal Medicine

## 2017-08-30 NOTE — Assessment & Plan Note (Signed)
On lipitor.  Low cholesterol diet and exercise.  Follow lipid panel and liver function tests.   

## 2017-08-30 NOTE — Assessment & Plan Note (Signed)
Has been followed by Dr Manuella Ghazi.

## 2017-08-30 NOTE — Assessment & Plan Note (Signed)
Increased stress.  No suicidal thoughts.  Trazodone to help her sleep.  Follow.

## 2017-08-30 NOTE — Assessment & Plan Note (Signed)
Discussed recent lab results.  Discussed low carb diet.  Follow met b and a1c.  No low sugars.

## 2017-08-30 NOTE — Assessment & Plan Note (Signed)
Blood pressure on recheck improved.  Continue current medication regimen.  Follow pressures.  Follow metabolic panel.   

## 2017-08-30 NOTE — Assessment & Plan Note (Signed)
On tamoxifen.  Followed by Dr Byrnett.   

## 2017-10-09 ENCOUNTER — Other Ambulatory Visit: Payer: Self-pay | Admitting: Internal Medicine

## 2017-10-13 DIAGNOSIS — H353221 Exudative age-related macular degeneration, left eye, with active choroidal neovascularization: Secondary | ICD-10-CM | POA: Diagnosis not present

## 2017-11-02 ENCOUNTER — Other Ambulatory Visit: Payer: Self-pay

## 2017-11-02 DIAGNOSIS — D0511 Intraductal carcinoma in situ of right breast: Secondary | ICD-10-CM

## 2017-11-27 ENCOUNTER — Other Ambulatory Visit: Payer: Self-pay | Admitting: Internal Medicine

## 2017-12-07 ENCOUNTER — Telehealth: Payer: Self-pay | Admitting: *Deleted

## 2017-12-07 MED ORDER — GLUCOSE BLOOD VI STRP: 1.0000 | ORAL_STRIP | 3 refills | Status: DC | PRN

## 2017-12-07 NOTE — Telephone Encounter (Signed)
Noted.  Do I need to do anything more or are we just waiting on her to call back?

## 2017-12-07 NOTE — Telephone Encounter (Signed)
Copied from [place] [phone] [phone]. Topic: General - Other >> Dec 07, 2017  8:19 AM Carolyn Stare wrote:  Pt ask that Dr Nicki Reaper give her a call back,did not want to give any other info

## 2017-12-07 NOTE — Telephone Encounter (Signed)
Called patient and found out she was wanting to speak with PCP concerning test strips medicare will not pay for patient to use twice daily due to she is not an insulin dependent diabetic , patient says he is scared to go without checking CBG twice daily. Sent script in so patient can get strips to at least test once daily. Patient would also like to discuss something her sis ter has been DX with and see if she needs a blood test patient will call back when she finds out what sister has.

## 2017-12-21 ENCOUNTER — Other Ambulatory Visit: Payer: Self-pay | Admitting: Internal Medicine

## 2017-12-22 DIAGNOSIS — H353231 Exudative age-related macular degeneration, bilateral, with active choroidal neovascularization: Secondary | ICD-10-CM | POA: Diagnosis not present

## 2017-12-24 ENCOUNTER — Ambulatory Visit: Payer: Medicare Other | Admitting: General Surgery

## 2018-01-06 ENCOUNTER — Ambulatory Visit
Admission: RE | Admit: 2018-01-06 | Discharge: 2018-01-06 | Disposition: A | Discharge status: Home / Self Care | Payer: Medicare Other | Source: Ambulatory Visit | Attending: General Surgery | Admitting: General Surgery

## 2018-01-06 DIAGNOSIS — D0511 Intraductal carcinoma in situ of right breast: Secondary | ICD-10-CM | POA: Diagnosis not present

## 2018-01-06 DIAGNOSIS — R922 Inconclusive mammogram: Secondary | ICD-10-CM | POA: Diagnosis not present

## 2018-01-06 HISTORY — DX: Personal history of irradiation: Z92.3

## 2018-01-07 ENCOUNTER — Other Ambulatory Visit (INDEPENDENT_AMBULATORY_CARE_PROVIDER_SITE_OTHER): Payer: Medicare Other

## 2018-01-07 DIAGNOSIS — E119 Type 2 diabetes mellitus without complications: Secondary | ICD-10-CM | POA: Diagnosis not present

## 2018-01-07 DIAGNOSIS — E7800 Pure hypercholesterolemia, unspecified: Secondary | ICD-10-CM | POA: Diagnosis not present

## 2018-01-07 LAB — LIPID PANEL
CHOLESTEROL: 134 mg/dL (ref 0–200)
HDL: 44.2 mg/dL (ref >39.00)
LDL Cholesterol: 61 mg/dL (ref 0–99)
NONHDL: 90.21
TRIGLYCERIDES: 144 mg/dL (ref 0.0–149.0)
Total CHOL/HDL Ratio: 3
VLDL: 28.8 mg/dL (ref 0.0–40.0)

## 2018-01-07 LAB — HEMOGLOBIN A1C: HEMOGLOBIN A1C: 6.9 % — AB (ref 4.6–6.5)

## 2018-01-07 LAB — BASIC METABOLIC PANEL
BUN: 18 mg/dL (ref 6–23)
CALCIUM: 10 mg/dL (ref 8.4–10.5)
CHLORIDE: 103 meq/L (ref 96–112)
CO2: 31 mEq/L (ref 19–32)
Creatinine, Ser: 1.03 mg/dL (ref 0.40–1.20)
GFR: 54.24 mL/min — AB (ref >60.00)
Glucose, Bld: 139 mg/dL — ABNORMAL HIGH (ref 70–99)
Potassium: 4.2 mEq/L (ref 3.5–5.1)
SODIUM: 140 meq/L (ref 135–145)

## 2018-01-07 LAB — HEPATIC FUNCTION PANEL
ALT: 13 U/L (ref 0–35)
AST: 18 U/L (ref 0–37)
Albumin: 3.7 g/dL (ref 3.5–5.2)
Alkaline Phosphatase: 55 U/L (ref 39–117)
BILIRUBIN TOTAL: 0.5 mg/dL (ref 0.2–1.2)
Bilirubin, Direct: 0.1 mg/dL (ref 0.0–0.3)
Total Protein: 6.7 g/dL (ref 6.0–8.3)

## 2018-01-07 LAB — MICROALBUMIN / CREATININE URINE RATIO
Creatinine,U: 198.2 mg/dL
MICROALB UR: 1 mg/dL (ref 0.0–1.9)
Microalb Creat Ratio: 0.5 mg/g (ref 0.0–30.0)

## 2018-01-12 ENCOUNTER — Ambulatory Visit (INDEPENDENT_AMBULATORY_CARE_PROVIDER_SITE_OTHER): Payer: Medicare Other | Admitting: Internal Medicine

## 2018-01-12 ENCOUNTER — Encounter: Payer: Self-pay | Admitting: Internal Medicine

## 2018-01-12 ENCOUNTER — Other Ambulatory Visit: Payer: Self-pay | Admitting: Internal Medicine

## 2018-01-12 ENCOUNTER — Ambulatory Visit: Payer: Medicare Other | Admitting: General Surgery

## 2018-01-12 VITALS — BP 124/70 | HR 72 | Temp 98.7°F | Resp 18 | Ht 63.5 in | Wt 139.4 lb

## 2018-01-12 DIAGNOSIS — E119 Type 2 diabetes mellitus without complications: Secondary | ICD-10-CM

## 2018-01-12 DIAGNOSIS — F321 Major depressive disorder, single episode, moderate: Secondary | ICD-10-CM

## 2018-01-12 DIAGNOSIS — E7800 Pure hypercholesterolemia, unspecified: Secondary | ICD-10-CM | POA: Diagnosis not present

## 2018-01-12 DIAGNOSIS — R6889 Other general symptoms and signs: Secondary | ICD-10-CM

## 2018-01-12 DIAGNOSIS — Z9109 Other allergy status, other than to drugs and biological substances: Secondary | ICD-10-CM

## 2018-01-12 DIAGNOSIS — Z23 Encounter for immunization: Secondary | ICD-10-CM

## 2018-01-12 DIAGNOSIS — Z0000 Encounter for general adult medical examination without abnormal findings: Secondary | ICD-10-CM | POA: Diagnosis not present

## 2018-01-12 DIAGNOSIS — L659 Nonscarring hair loss, unspecified: Secondary | ICD-10-CM

## 2018-01-12 DIAGNOSIS — Z85820 Personal history of malignant melanoma of skin: Secondary | ICD-10-CM | POA: Diagnosis not present

## 2018-01-12 DIAGNOSIS — R0989 Other specified symptoms and signs involving the circulatory and respiratory systems: Secondary | ICD-10-CM

## 2018-01-12 DIAGNOSIS — I10 Essential (primary) hypertension: Secondary | ICD-10-CM

## 2018-01-12 DIAGNOSIS — D0511 Intraductal carcinoma in situ of right breast: Secondary | ICD-10-CM

## 2018-01-12 DIAGNOSIS — G7000 Myasthenia gravis without (acute) exacerbation: Secondary | ICD-10-CM

## 2018-01-12 DIAGNOSIS — G629 Polyneuropathy, unspecified: Secondary | ICD-10-CM | POA: Diagnosis not present

## 2018-01-12 LAB — HM DIABETES FOOT EXAM

## 2018-01-12 NOTE — Progress Notes (Signed)
Patient ID: Cynthia [name], female   DOB: [birth year], 84 y.o.   MRN: [id number]   Subjective:    Patient ID: Cynthia [name], female    DOB: June 23, 1931, 84 y.o.   MRN: [id number]  HPI  Patient with past history of breast cancer, diabetes, hypertension and hypercholesterolemia.  She comes in today to follow up on these issues as well as for a complete physical exam.  She reports she is doing relatively well.  Repots noticing some hair loss.  No sinus congestion.  Does report phlegm in her throat.  Notices when she eats.  Question of drainage.  No chest congestion.  No chest pain.  No sob.  No abdominal pain.  Bowels moving.  Occasional left foot burning.  Reviewed blood sugars.  Reports balance not as good.  No actual dizziness.  No headache.  Discussed mood.  Occasionally feels down when she thinks about not being able to do things like she used to do.  No suicidal ideations.  Does not feel needs anything more at this time.     Past Medical History:  Diagnosis Date  . Allergic rhinitis   . Breast cancer ([place]) 2014   RT LUMPECTOMY  . 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 Resected margins negative. Whole breast radiation  . Cancer ([place]) 12-15-12   left upper arm, malignant melanoma Clark level II, Breslow thickness: 0.35 mm.  . Diabetes mellitus ([place])   . Hypercholesterolemia   . Hypertension   . Myasthenia gravis ([place])    ocular  . Personal history of radiation therapy 2014   Rt. Breast  . S/P radiation therapy 2015   BREAST CA   Past Surgical History:  Procedure Laterality Date  . BREAST BIOPSY Right 2014   DCIS  . BREAST LUMPECTOMY Right 2014   Lumpectomy  . BREAST SURGERY Right 2014   lumpectomy  . EYE SURGERY     Caterac both eyes   Family History  Problem Relation Age of Onset  . Skin cancer Father   . Cancer Father        colon  . Colon cancer Brother   . Cancer Brother        colon  . Breast cancer Unknown        niece   Social History    Socioeconomic History  . Marital status: Divorced    Spouse name: Not on file  . Number of children: Not on file  . Years of education: Not on file  . Highest education level: Not on file  Occupational History  . Not on file  Social Needs  . Financial resource strain: Not hard at all  . Food insecurity:    Worry: Never true    Inability: Never true  . Transportation needs:    Medical: No    Non-medical: No  Tobacco Use  . Smoking status: Never Smoker  . Smokeless tobacco: Never Used  Substance and Sexual Activity  . Alcohol use: No    Alcohol/week: 0.0 standard drinks  . Drug use: No  . Sexual activity: Never  Lifestyle  . Physical activity:    Days per week: 0 days    Minutes per session: Not on file  . Stress: Not at all  Relationships  . Social connections:    Talks on phone: Not on file    Gets together: Not on file    Attends religious service: Not on file  Active member of club or organization: Not on file    Attends meetings of clubs or organizations: Not on file    Relationship status: Not on file  Other Topics Concern  . Not on file  Social History Narrative  . Not on file    Outpatient Encounter Medications as of 01/12/2018  Medication Sig  . aspirin 81 MG tablet Take 81 mg by mouth daily.  Marland Kitchen atorvastatin (LIPITOR) 20 MG tablet TAKE 1 TABLET EVERY DAY  . Calcium Carbonate-Vitamin D (CALCIUM 600+D) 600-400 MG-UNIT per tablet Take 1 tablet by mouth 2 (two) times daily.  Marland Kitchen glucose blood (ACCU-CHEK AVIVA PLUS) test strip 1 each by Other route as needed for other. Use as instructed to check blood sugar once daily.DX E11.9  . hydrochlorothiazide (HYDRODIURIL) 25 MG tablet TAKE 1 TABLET EVERY DAY  . Multiple Vitamins-Minerals (ICAPS AREDS 2) CAPS Take 1 capsule by mouth 2 (two) times daily.  . potassium chloride (K-DUR,KLOR-CON) 10 MEQ tablet TAKE 1 TABLET EVERY DAY  . ramipril (ALTACE) 10 MG capsule TAKE 1 CAPSULE EVERY DAY  . tamoxifen (NOLVADEX) 20 MG  tablet TAKE 1 TABLET EVERY DAY  . [DISCONTINUED] ACCU-CHEK SOFTCLIX LANCETS lancets USE TWICE A DAY  . [DISCONTINUED] potassium chloride (K-DUR,KLOR-CON) 10 MEQ tablet TAKE 1 TABLET EVERY DAY  . [DISCONTINUED] potassium chloride (K-DUR,KLOR-CON) 10 MEQ tablet TAKE 1 TABLET EVERY DAY  . [DISCONTINUED] traZODone (DESYREL) 50 MG tablet Take 1 tablet (50 mg total) by mouth at bedtime.  . [DISCONTINUED] triamcinolone cream (KENALOG) 0.1 % Apply 1 application topically 2 (two) times daily.   No facility-administered encounter medications on file as of 01/12/2018.     Review of Systems  Constitutional: Negative for appetite change and unexpected weight change.  HENT: Negative for congestion and sinus pressure.   Respiratory: Negative for cough, chest tightness and shortness of breath.        Clearing throat.    Cardiovascular: Negative for chest pain, palpitations and leg swelling.  Gastrointestinal: Negative for abdominal pain, diarrhea, nausea and vomiting.  Genitourinary: Negative for difficulty urinating and dysuria.  Musculoskeletal: Negative for joint swelling and myalgias.  Skin: Negative for color change and rash.  Neurological: Negative for dizziness, light-headedness and headaches.  Psychiatric/Behavioral: Negative for agitation and dysphoric mood.       Objective:    Physical Exam  Constitutional: She appears well-developed and well-nourished. No distress.  HENT:  Nose: Nose normal.  Mouth/Throat: Oropharynx is clear and moist.  Neck: Neck supple. No thyromegaly present.  Cardiovascular: Normal rate and regular rhythm.  Pulmonary/Chest: Breath sounds normal. No respiratory distress. She has no wheezes.  Abdominal: Soft. Bowel sounds are normal. There is no tenderness.  Musculoskeletal: She exhibits no edema or tenderness.  Feet:  No lesions.  DP pulses palpable and equal bilaterally.  Intact to light touch and pin prick.    Lymphadenopathy:    She has no cervical  adenopathy.  Skin: No rash noted. No erythema.  Psychiatric: She has a normal mood and affect. Her behavior is normal.    BP 124/70 (BP Location: Left Arm, Patient Position: Sitting, Cuff Size: Normal)   Pulse 72   Temp 98.7 F (37.1 C) (Oral)   Resp 18   Ht 5' 3.5" (1.613 m)   Wt 139 lb 6.4 oz (63.2 kg)   SpO2 96%   BMI 24.31 kg/m  Wt Readings from Last 3 Encounters:  01/12/18 139 lb 6.4 oz (63.2 kg)  08/24/17 139 lb 3.2 oz (63.1  kg)  06/23/17 142 lb (64.4 kg)     Lab Results  Component Value Date   WBC 9.1 04/16/2017   HGB 12.1 04/16/2017   HCT 37.1 04/16/2017   PLT 221.0 04/16/2017   GLUCOSE 139 (H) 01/07/2018   CHOL 134 01/07/2018   TRIG 144.0 01/07/2018   HDL 44.20 01/07/2018   LDLCALC 61 01/07/2018   ALT 13 01/07/2018   AST 18 01/07/2018   NA 140 01/07/2018   K 4.2 01/07/2018   CL 103 01/07/2018   CREATININE 1.03 01/07/2018   BUN 18 01/07/2018   CO2 31 01/07/2018   TSH 1.04 01/12/2018   HGBA1C 6.9 (H) 01/07/2018   MICROALBUR 1.0 01/07/2018    Mm Diag Breast Tomo Bilateral  Result Date: 01/06/2018 CLINICAL DATA:  History of right breast cancer status post lumpectomy in 2014. EXAM: DIGITAL DIAGNOSTIC BILATERAL MAMMOGRAM WITH CAD AND TOMO COMPARISON:  Previous exam(s). ACR Breast Density Category c: The breast tissue is heterogeneously dense, which may obscure small masses. FINDINGS: Stable lumpectomy changes are seen in the right breast. No suspicious mass or malignant type microcalcifications identified in either breast. Mammographic images were processed with CAD. IMPRESSION: No evidence of malignancy in either breast. RECOMMENDATION: Bilateral screening mammogram in 1 year is recommended. I have discussed the findings and recommendations with the patient. Results were also provided in writing at the conclusion of the visit. If applicable, a reminder letter will be sent to the patient regarding the next appointment. BI-RADS CATEGORY  2: Benign. Electronically  Signed   By: Lillia Mountain M.D.   On: 01/06/2018 14:35       Assessment & Plan:   Problem List Items Addressed This Visit    DCIS (ductal carcinoma in situ) of breast    On tamoxifen.  Followed by Dr Bary Castilla.        Depression, major, single episode, moderate ([place])    Discussed with her today.  Overall doing better.  Follow.  Does not feel needs any further intervention.        Diabetes mellitus ([place])    Low carb diet and exercise.  Outside sugar checks reviewed and appear to be under good control.   Follow met b and a1c.        Relevant Orders   Hemoglobin A1c   Environmental allergies    Discussed nasal spray.  Follow.        Health care maintenance    Physical today 01/12/18.  Mammogram 01/06/18 - Birads II.        History of melanoma    Followed by dermatology.        Hypercholesterolemia    On lipitor.  Low cholesterol diet and exercise.  Follow lipid panel and liver function tests.        Relevant Orders   Lipid panel   Hepatic function panel   Hypertension    Blood pressure under good control.  Continue same medication regimen.  Follow pressures.  Follow metabolic panel.        Relevant Orders   CBC with Differential/Platelet   Basic metabolic panel   Myasthenia gravis ([place])    Followed by Dr Manuella Ghazi.       Throat clearing    Describes throat clearing and phlegm production as outlined.  Trial of pepcid.  Follow.         Other Visit Diagnoses    Hair loss    -  Primary   Check tsh.  Discussed dermatology evaluation.  Relevant Orders   TSH (Completed)   Neuropathy       Discussed with her today.  Can contribute to unsteadiness.  Discussed therapy.  Wants to follow.     Encounter for immunization       Relevant Orders   Flu vaccine HIGH DOSE PF (Completed)       Einar Pheasant, MD

## 2018-01-12 NOTE — Patient Instructions (Signed)
pepcid 20mg - take one tablet 30 minutes before breakfast 

## 2018-01-12 NOTE — Assessment & Plan Note (Signed)
Physical today 01/12/18.  Mammogram 01/06/18 - Birads II.

## 2018-01-13 LAB — TSH: TSH: 1.04 u[IU]/mL (ref 0.35–4.50)

## 2018-01-14 ENCOUNTER — Ambulatory Visit: Payer: Medicare Other | Admitting: General Surgery

## 2018-01-19 ENCOUNTER — Telehealth: Payer: Self-pay | Admitting: Internal Medicine

## 2018-01-19 NOTE — Telephone Encounter (Signed)
Copied from [place] [phone]. Topic: Quick Communication - Lab Results (Clinic Use ONLY) >> Jan 14, 2018  3:35 PM Leeanne Rio, CMA wrote: Notes recorded by Leeanne Rio, CMA on 01/14/2018 at 3:35 PM EST Left message to return call. Okay for [facility] to give results ------  Notes recorded by Einar Pheasant, MD on 01/14/2018 at 5:03 AM EST Please call and notify pt that her thyroid test is wnl. >> Jan 19, 2018  4:12 PM Bea Graff, NT wrote: Pt returning call to receive her TSH result.

## 2018-01-19 NOTE — Telephone Encounter (Signed)
Results given and documented in result note. 

## 2018-01-20 ENCOUNTER — Other Ambulatory Visit: Payer: Self-pay

## 2018-01-20 ENCOUNTER — Other Ambulatory Visit: Payer: Self-pay | Admitting: Internal Medicine

## 2018-01-20 MED ORDER — ACCU-CHEK SOFTCLIX LANCETS MISC: 3 refills | Status: AC

## 2018-01-20 MED ORDER — ACCU-CHEK SOFTCLIX LANCETS MISC: 3 refills | Status: DC

## 2018-01-20 NOTE — Telephone Encounter (Signed)
Copied from [place] [id number]. Topic: Quick Communication - Rx Refill/Question >> Jan 20, 2018  3:31 PM Leward Quan A wrote: Medication:   Has the patient contacted their pharmacy? Yes.   (Agent: If no, request that the patient contact the pharmacy for the refill.) (Agent: If yes, when and what did the pharmacy advise?)  Preferred Pharmacy (with phone number or street name): [id number] [address], [place] [phone] (Phone) [phone] (Fax)    Agent: Please be advised that RX refills may take up to 3 business days. We ask that you follow-up with your pharmacy.

## 2018-01-20 NOTE — Telephone Encounter (Signed)
Requested Prescriptions  Pending Prescriptions Disp Refills  . ACCU-CHEK SOFTCLIX LANCETS lancets 100 each 3    Sig: USE TWICE A DAY     Endocrinology: Diabetes - Testing Supplies Passed - 01/20/2018  3:38 PM      Passed - Valid encounter within last 12 months    Recent Outpatient Visits          1 week ago Hair loss   [place], [place], [place]   4 months ago Knee pain, unspecified chronicity, unspecified laterality   [facility] [place], [place], [place]   9 months ago Type 2 diabetes mellitus without complication, without long-term current use of insulin ([place])   [facility] [place], [place] Patient, MD   1 year ago Encounter for immunization   [facility] Einar Pheasant, MD   1 year ago Estrogen deficiency   [facility] Einar Pheasant, MD      Future Appointments            In 4 months Einar Pheasant, MD [facility], [place]   In 5 months [place], [place], [place], [place]   In 5 months Einar Pheasant, MD [facility], [place]

## 2018-01-24 ENCOUNTER — Encounter: Payer: Self-pay | Admitting: Internal Medicine

## 2018-01-24 DIAGNOSIS — R6889 Other general symptoms and signs: Secondary | ICD-10-CM | POA: Insufficient documentation

## 2018-01-24 DIAGNOSIS — R0989 Other specified symptoms and signs involving the circulatory and respiratory systems: Secondary | ICD-10-CM | POA: Insufficient documentation

## 2018-01-24 NOTE — Assessment & Plan Note (Signed)
Followed by dermatology

## 2018-01-24 NOTE — Assessment & Plan Note (Signed)
Describes throat clearing and phlegm production as outlined.  Trial of pepcid.  Follow.

## 2018-01-24 NOTE — Assessment & Plan Note (Signed)
Discussed with her today.  Overall doing better.  Follow.  Does not feel needs any further intervention.

## 2018-01-24 NOTE — Assessment & Plan Note (Signed)
On tamoxifen.  Followed by Dr Bary Castilla.

## 2018-01-24 NOTE — Assessment & Plan Note (Signed)
Followed by Dr. Shah. 

## 2018-01-24 NOTE — Assessment & Plan Note (Signed)
Discussed nasal spray.  Follow.

## 2018-01-24 NOTE — Assessment & Plan Note (Signed)
On lipitor.  Low cholesterol diet and exercise.  Follow lipid panel and liver function tests.   

## 2018-01-24 NOTE — Assessment & Plan Note (Signed)
Blood pressure under good control.  Continue same medication regimen.  Follow pressures.  Follow metabolic panel.   

## 2018-01-24 NOTE — Assessment & Plan Note (Signed)
Low carb diet and exercise.  Outside sugar checks reviewed and appear to be under good control.   Follow met b and a1c.

## 2018-01-26 ENCOUNTER — Ambulatory Visit (INDEPENDENT_AMBULATORY_CARE_PROVIDER_SITE_OTHER): Payer: Medicare Other | Admitting: General Surgery

## 2018-01-26 ENCOUNTER — Encounter: Payer: Self-pay | Admitting: General Surgery

## 2018-01-26 ENCOUNTER — Other Ambulatory Visit: Payer: Self-pay

## 2018-01-26 VITALS — BP 148/72 | HR 84 | Temp 97.3°F | Resp 12 | Ht 63.5 in | Wt 139.0 lb

## 2018-01-26 DIAGNOSIS — D0511 Intraductal carcinoma in situ of right breast: Secondary | ICD-10-CM | POA: Diagnosis not present

## 2018-01-26 NOTE — Progress Notes (Signed)
Patient ID: Cynthia [name], female   DOB: 17-Jul-1931, 84 y.o.   MRN: [id number]  Chief Complaint  Patient presents with  . Follow-up    HPI Cynthia [name] is a 84 y.o. female.  who presents for her follow up right breast cancer and a breast evaluation. The most recent mammogram was done on 01-06-18.  Patient does perform regular self breast checks and gets regular mammograms done.   No new breast issues, tolerating tamoxifen, hopes to come off soon.  HPI  Past Medical History:  Diagnosis Date  . Allergic rhinitis   . Breast cancer ([place]) 2014   RT LUMPECTOMY  . 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 Resected margins negative. Whole breast radiation  . Cancer ([place]) 12-15-12   left upper arm, malignant melanoma Clark level II, Breslow thickness: 0.35 mm.  . Diabetes mellitus ([place])   . Hypercholesterolemia   . Hypertension   . Myasthenia gravis ([place])    ocular  . Personal history of radiation therapy 2014   Rt. Breast  . S/P radiation therapy 2015   BREAST CA    Past Surgical History:  Procedure Laterality Date  . BREAST BIOPSY Right 2014   DCIS  . BREAST LUMPECTOMY Right 2014   Lumpectomy  . BREAST SURGERY Right 2014   lumpectomy  . EYE SURGERY     Caterac both eyes    Family History  Problem Relation Age of Onset  . Skin cancer Father   . Cancer Father        colon  . Colon cancer Brother   . Cancer Brother        colon  . Breast cancer Unknown        niece  . Skin cancer Sister     Social History Social History   Tobacco Use  . Smoking status: Never Smoker  . Smokeless tobacco: Never Used  Substance Use Topics  . Alcohol use: No    Alcohol/week: 0.0 standard drinks  . Drug use: No    Allergies  Allergen Reactions  . No Known Drug Allergy     Current Outpatient Medications  Medication Sig Dispense Refill  . ACCU-CHEK SOFTCLIX LANCETS lancets USE ONCE A DAY 100 each 3  . aspirin 81 MG tablet Take 81 mg by mouth daily.     Marland Kitchen atorvastatin (LIPITOR) 20 MG tablet TAKE 1 TABLET EVERY DAY 90 tablet 2  . Calcium Carbonate-Vitamin D (CALCIUM 600+D) 600-400 MG-UNIT per tablet Take 1 tablet by mouth 2 (two) times daily.    Marland Kitchen glucose blood (ACCU-CHEK AVIVA PLUS) test strip 1 each by Other route as needed for other. Use as instructed to check blood sugar once daily.DX E11.9 100 each 3  . hydrochlorothiazide (HYDRODIURIL) 25 MG tablet TAKE 1 TABLET EVERY DAY 90 tablet 0  . Multiple Vitamins-Minerals (ICAPS AREDS 2) CAPS Take 1 capsule by mouth 2 (two) times daily.    . potassium chloride (K-DUR,KLOR-CON) 10 MEQ tablet TAKE 1 TABLET EVERY DAY 90 tablet 0  . ramipril (ALTACE) 10 MG capsule TAKE 1 CAPSULE EVERY DAY 90 capsule 3  . tamoxifen (NOLVADEX) 20 MG tablet TAKE 1 TABLET EVERY DAY 90 tablet 3   No current facility-administered medications for this visit.     Review of Systems Review of Systems  Constitutional: Negative.   Respiratory: Negative.   Cardiovascular: Negative.     Blood pressure (!) 148/72, pulse 84, temperature (!) 97.3 F (36.3 C),  temperature source Skin, resp. rate 12, height 5' 3.5" (1.613 m), weight 139 lb (63 kg), SpO2 98 %.  Physical Exam Physical Exam  Constitutional: She is oriented to person, place, and time. She appears well-developed and well-nourished.  HENT:  Mouth/Throat: Oropharynx is clear and moist.  Eyes: Conjunctivae are normal. No scleral icterus.  Neck: Neck supple.  Cardiovascular: Normal rate, regular rhythm and normal heart sounds.  Pulmonary/Chest: Effort normal and breath sounds normal. Right breast exhibits no inverted nipple, no mass, no nipple discharge, no skin change and no tenderness. Left breast exhibits no inverted nipple, no mass, no nipple discharge, no skin change and no tenderness.  Right lumpectomy site well healed scar.    Lymphadenopathy:    She has no cervical adenopathy.    She has no axillary adenopathy.       Right: No supraclavicular adenopathy  present.       Left: No supraclavicular adenopathy present.  Neurological: She is alert and oriented to person, place, and time.  Skin: Skin is warm and dry.  Psychiatric: Her behavior is normal.    Data Reviewed January 06, 2018 mammograms reviewed.  Postsurgical change.  BI-RADS-2.  Assessment    No evidence of recurrent cancer.    Plan    Patient will be asked to return to the office in one year with a bilateral screening mammogram. The patient is aware to call back for any questions or new concerns. Complete tamoxifen that she has on hand and then may stop.      HPI, Physical Exam, Assessment and Plan have been scribed under the direction and in the presence of Robert Bellow, MD. Karie Fetch, RN  I have completed the exam and reviewed the above documentation for accuracy and completeness.  I agree with the above.  Haematologist has been used and any errors in dictation or transcription are unintentional.  Hervey Ard, M.D., F.A.C.S.  Forest Gleason  01/26/2018, 8:16 PM

## 2018-01-26 NOTE — Patient Instructions (Addendum)
The patient is aware to call back for any questions or new concerns. Patient will be asked to return to the office in one year with a bilateral screening mammogram. Complete tamoxifen that she has on hand and then may stop.

## 2018-01-28 ENCOUNTER — Telehealth: Payer: Self-pay

## 2018-01-28 NOTE — Telephone Encounter (Signed)
Copied from [place] [id number]. Topic: General - Other >> Jan 28, 2018  9:52 AM Carolyn Stare wrote:  Pt would like a call back she said she received results about her Thyroid test and she does not understand it and would like to discuss   **I spoke with patient & explained results. She wanted make sure she understood her lab work & that it was normal.**

## 2018-02-11 DIAGNOSIS — H353221 Exudative age-related macular degeneration, left eye, with active choroidal neovascularization: Secondary | ICD-10-CM | POA: Diagnosis not present

## 2018-03-17 ENCOUNTER — Other Ambulatory Visit: Payer: Self-pay | Admitting: Internal Medicine

## 2018-03-18 ENCOUNTER — Other Ambulatory Visit: Payer: Self-pay | Admitting: Internal Medicine

## 2018-03-18 MED ORDER — GLUCOSE BLOOD VI STRP: 1.0000 | ORAL_STRIP | 3 refills | Status: DC | PRN

## 2018-03-18 NOTE — Telephone Encounter (Signed)
Copied from [place] [id number]. Topic: Quick Communication - Rx Refill/Question >> Mar 18, 2018 10:13 AM Keene Breath wrote: Medication: glucose blood (ACCU-CHEK AVIVA PLUS) test strip  Patient called to request a refill for the above medication  Preferred Pharmacy (with phone number or street name): [id number] [address], [place] [phone] (Phone) [phone] (Fax)

## 2018-04-08 DIAGNOSIS — H353221 Exudative age-related macular degeneration, left eye, with active choroidal neovascularization: Secondary | ICD-10-CM | POA: Diagnosis not present

## 2018-04-12 ENCOUNTER — Other Ambulatory Visit: Payer: Self-pay | Admitting: Internal Medicine

## 2018-04-15 DIAGNOSIS — J069 Acute upper respiratory infection, unspecified: Secondary | ICD-10-CM | POA: Diagnosis not present

## 2018-04-29 ENCOUNTER — Encounter: Payer: Self-pay | Admitting: Family Medicine

## 2018-04-29 ENCOUNTER — Ambulatory Visit (INDEPENDENT_AMBULATORY_CARE_PROVIDER_SITE_OTHER): Payer: Medicare Other | Admitting: Family Medicine

## 2018-04-29 VITALS — BP 140/82 | HR 82 | Temp 98.5°F | Resp 16 | Ht 63.0 in | Wt 141.2 lb

## 2018-04-29 DIAGNOSIS — L989 Disorder of the skin and subcutaneous tissue, unspecified: Secondary | ICD-10-CM | POA: Diagnosis not present

## 2018-04-29 DIAGNOSIS — J069 Acute upper respiratory infection, unspecified: Secondary | ICD-10-CM | POA: Diagnosis not present

## 2018-04-29 DIAGNOSIS — N390 Urinary tract infection, site not specified: Secondary | ICD-10-CM

## 2018-04-29 DIAGNOSIS — R309 Painful micturition, unspecified: Secondary | ICD-10-CM | POA: Diagnosis not present

## 2018-04-29 DIAGNOSIS — B9789 Other viral agents as the cause of diseases classified elsewhere: Secondary | ICD-10-CM | POA: Diagnosis not present

## 2018-04-29 LAB — POCT URINALYSIS DIPSTICK
Bilirubin, UA: NEGATIVE
Glucose, UA: NEGATIVE
KETONES UA: NEGATIVE
Nitrite, UA: NEGATIVE
PH UA: 7 (ref 5.0–8.0)
PROTEIN UA: NEGATIVE
RBC UA: NEGATIVE
SPEC GRAV UA: 1.015 (ref 1.010–1.025)
Urobilinogen, UA: 0.2 E.U./dL

## 2018-04-29 MED ORDER — GUAIFENESIN ER 600 MG PO TB12: 600.0000 mg | ORAL_TABLET | Freq: Two times a day (BID) | ORAL | 1 refills | Status: DC

## 2018-04-29 MED ORDER — CEFDINIR 300 MG PO CAPS: 300.0000 mg | ORAL_CAPSULE | Freq: Two times a day (BID) | ORAL | 0 refills | Status: DC

## 2018-04-29 NOTE — Progress Notes (Signed)
Subjective:    Patient ID: Cynthia [name], female    DOB: 12-14-1931, 85 y.o.   MRN: [id number]  HPI   Patient presents to clinic with multiple complaints:  First is painful urination, burning, pressure and increased frequency that began 1 week ago.  Denies nausea or vomiting.  Denies fever or chills.  Patient also complains of a cough and chest congestion that has been present for 2 weeks.  She did go to urgent care a week and a half ago, and was diagnosed with a viral URI, was advised to take [employer] and Mucinex.  Patient did not find the Tessalon Perles helpful, but did finish a 7-day, twice daily round of Mucinex.  Did notice some help and cough reduction and thinning of phlegm.  However is concerned because cough is still present.  Third issue is a spot on back, feels like a bump.  It is not painful, patient is unsure if it is red.  Patient did put some Neosporin ointment on it the past 3 nights prior to bed.  Patient Active Problem List   Diagnosis Date Noted  . Throat clearing 01/24/2018  . Depression, major, single episode, moderate ([place]) 04/22/2017  . Health care maintenance 12/18/2016  . Pain and swelling of toe of left foot 01/01/2016  . Left leg pain 06/07/2015  . Left leg numbness 03/07/2014  . History of melanoma 01/06/2013  . DCIS (ductal carcinoma in situ) of breast 12/15/2012  . Environmental allergies 11/10/2012  . Myasthenia gravis ([place]) 01/20/2012  . Hypertension 01/20/2012  . Hypercholesterolemia 01/20/2012  . Diabetes mellitus ([place]) 01/20/2012  . Osteopenia 01/20/2012   Social History   Tobacco Use  . Smoking status: Never Smoker  . Smokeless tobacco: Never Used  Substance Use Topics  . Alcohol use: No    Alcohol/week: 0.0 standard drinks   Review of Systems   Constitutional: Negative for chills, fatigue and fever.  HENT: Some nasal congestion. Negative for ear pain, sinus pain and sore throat.   Eyes: Negative.   Respiratory: +cough and  chest congestion. Negative for shortness of breath and wheezing.   Cardiovascular: Negative for chest pain, palpitations and leg swelling.  Gastrointestinal: Negative for abdominal pain, diarrhea, nausea and vomiting.  Genitourinary: +dysuria, frequency and urgency.  Musculoskeletal: Negative for arthralgias and myalgias.  Skin: Negative for color change, pallor and rash.  Neurological: Negative for syncope, light-headedness and headaches.  Psychiatric/Behavioral: The patient is not nervous/anxious.       Objective:   Physical Exam Vitals signs and nursing note reviewed.  Constitutional:      General: She is not in acute distress.    Appearance: She is well-developed. She is not toxic-appearing.  HENT:     Head: Normocephalic and atraumatic.     Right Ear: Tympanic membrane, ear canal and external ear normal.     Left Ear: Tympanic membrane, ear canal and external ear normal.     Nose: Congestion (clear, mild. +PND ) and rhinorrhea present.     Mouth/Throat:     Mouth: Mucous membranes are moist.     Pharynx: No oropharyngeal exudate.  Eyes:     General: No scleral icterus.    Extraocular Movements: Extraocular movements intact.     Conjunctiva/sclera: Conjunctivae normal.  Neck:     Musculoskeletal: Neck supple.     Trachea: No tracheal deviation.  Cardiovascular:     Rate and Rhythm: Normal rate and regular rhythm.     Heart sounds: Normal  heart sounds.  Pulmonary:     Effort: Pulmonary effort is normal. No respiratory distress.     Breath sounds: Normal breath sounds. No wheezing, rhonchi or rales.  Abdominal:     General: Bowel sounds are normal. There is no distension.     Palpations: Abdomen is soft. There is no mass.     Tenderness: There is abdominal tenderness (mild suprapubic). There is no right CVA tenderness, left CVA tenderness, guarding or rebound.  Skin:    General: Skin is warm and dry.     Coloration: Skin is not jaundiced or pale.          Comments:  Normal skin colored raised lesion on back indicated by red dot on diagram.  Does not appear inflamed or infected.  Neurological:     Mental Status: She is alert and oriented to person, place, and time.     Gait: Gait normal.  Psychiatric:        Mood and Affect: Mood normal.        Behavior: Behavior normal.    Vitals:   04/29/18 1109  BP: 140/82  Pulse: 82  Resp: 16  Temp: 98.5 F (36.9 C)  SpO2: 97%       Assessment & Plan:    UTI/painful urination-patient's dipstick is positive for leukocytes, will treat for UTI with Omnicef twice daily for 5 days.  Patient advised to increase water intake, do good handwashing, wear cotton underwear, always wipe front to back and avoid excess sugar and caffeine.  Viral URI with cough- patient's lungs are clear on exam.  Advised patient that I suspect her cough is related to a viral upper respiratory infection, and that coughs can often linger do take longer to resolve.  I recommended patient take more Mucinex, Mucinex sent into pharmacy for patient.  Skin lesion- on the back, does not appear infected.  It is same color as rest of skin.  Patient advised to monitor the area for any increase in size, if it does change we can consider dermatology referral.  Patient will keep regularly scheduled follow-up with PCP as planned.  Advised to return to clinic sooner if any issues arise.

## 2018-05-01 LAB — URINE CULTURE
MICRO NUMBER: 281382
SPECIMEN QUALITY:: ADEQUATE

## 2018-05-04 ENCOUNTER — Telehealth: Payer: Self-pay

## 2018-05-04 NOTE — Telephone Encounter (Signed)
Copied from [place] [id number]. Topic: General - Other >> May 03, 2018  5:30 PM Gustavus Messing wrote: Reason for CRM: Patient would like for Dr. Nicki Reaper or her nurse to call her tomorrow. Would not go into any details. Said that Dr. Nicki Reaper would know why she is calling.

## 2018-05-05 NOTE — Telephone Encounter (Signed)
Pt was calling to let me know that she had a UTI and finished abx on 3/10. Stated she is feeling better and she is going to let me know if she feels like she needs to be seen again. Do you want to do a f/u urine on her in 2 weeks since she is done with her abx?

## 2018-05-05 NOTE — Telephone Encounter (Signed)
Please call pt and let her know that I am not in the office and find out what she needs.  Thanks.

## 2018-05-05 NOTE — Telephone Encounter (Signed)
Noted. Advised pt to call if needed

## 2018-05-05 NOTE — Telephone Encounter (Signed)
If she is not having symptoms, no need for f/u urine.  Let us know if problems.

## 2018-05-12 ENCOUNTER — Other Ambulatory Visit: Payer: Medicare Other

## 2018-05-14 ENCOUNTER — Ambulatory Visit: Payer: Medicare Other | Admitting: Internal Medicine

## 2018-06-03 DIAGNOSIS — H353221 Exudative age-related macular degeneration, left eye, with active choroidal neovascularization: Secondary | ICD-10-CM | POA: Diagnosis not present

## 2018-06-14 ENCOUNTER — Other Ambulatory Visit: Payer: Self-pay | Admitting: Internal Medicine

## 2018-06-14 ENCOUNTER — Ambulatory Visit: Payer: Medicare Other | Admitting: Internal Medicine

## 2018-06-15 ENCOUNTER — Other Ambulatory Visit (INDEPENDENT_AMBULATORY_CARE_PROVIDER_SITE_OTHER): Payer: Medicare Other

## 2018-06-15 ENCOUNTER — Other Ambulatory Visit: Payer: Self-pay

## 2018-06-15 DIAGNOSIS — E119 Type 2 diabetes mellitus without complications: Secondary | ICD-10-CM | POA: Diagnosis not present

## 2018-06-15 DIAGNOSIS — I10 Essential (primary) hypertension: Secondary | ICD-10-CM

## 2018-06-15 DIAGNOSIS — E7800 Pure hypercholesterolemia, unspecified: Secondary | ICD-10-CM | POA: Diagnosis not present

## 2018-06-15 LAB — LIPID PANEL
Cholesterol: 141 mg/dL (ref 0–200)
HDL: 38.9 mg/dL — ABNORMAL LOW (ref >39.00)
LDL Cholesterol: 68 mg/dL (ref 0–99)
NonHDL: 102.1
Total CHOL/HDL Ratio: 4
Triglycerides: 173 mg/dL — ABNORMAL HIGH (ref 0.0–149.0)
VLDL: 34.6 mg/dL (ref 0.0–40.0)

## 2018-06-15 LAB — HEPATIC FUNCTION PANEL
ALT: 11 U/L (ref 0–35)
AST: 15 U/L (ref 0–37)
Albumin: 3.6 g/dL (ref 3.5–5.2)
Alkaline Phosphatase: 69 U/L (ref 39–117)
Bilirubin, Direct: 0 mg/dL (ref 0.0–0.3)
Total Bilirubin: 0.5 mg/dL (ref 0.2–1.2)
Total Protein: 6.6 g/dL (ref 6.0–8.3)

## 2018-06-15 LAB — CBC WITH DIFFERENTIAL/PLATELET
Basophils Absolute: 0 10*3/uL (ref 0.0–0.1)
Basophils Relative: 0.4 % (ref 0.0–3.0)
Eosinophils Absolute: 0.3 10*3/uL (ref 0.0–0.7)
Eosinophils Relative: 3.1 % (ref 0.0–5.0)
HCT: 37.6 % (ref 36.0–46.0)
Hemoglobin: 12.3 g/dL (ref 12.0–15.0)
Lymphocytes Relative: 28.8 % (ref 12.0–46.0)
Lymphs Abs: 2.3 10*3/uL (ref 0.7–4.0)
MCHC: 32.7 g/dL (ref 30.0–36.0)
MCV: 86.1 fl (ref 78.0–100.0)
Monocytes Absolute: 0.5 10*3/uL (ref 0.1–1.0)
Monocytes Relative: 6.4 % (ref 3.0–12.0)
Neutro Abs: 4.9 10*3/uL (ref 1.4–7.7)
Neutrophils Relative %: 61.3 % (ref 43.0–77.0)
Platelets: 225 10*3/uL (ref 150.0–400.0)
RBC: 4.37 Mil/uL (ref 3.87–5.11)
RDW: 14.1 % (ref 11.5–15.5)
WBC: 8.1 10*3/uL (ref 4.0–10.5)

## 2018-06-15 LAB — BASIC METABOLIC PANEL
BUN: 15 mg/dL (ref 6–23)
CO2: 30 mEq/L (ref 19–32)
Calcium: 9.6 mg/dL (ref 8.4–10.5)
Chloride: 101 mEq/L (ref 96–112)
Creatinine, Ser: 0.89 mg/dL (ref 0.40–1.20)
GFR: 60.34 mL/min (ref >60.00)
Glucose, Bld: 114 mg/dL — ABNORMAL HIGH (ref 70–99)
Potassium: 3.5 mEq/L (ref 3.5–5.1)
Sodium: 138 mEq/L (ref 135–145)

## 2018-06-15 LAB — HEMOGLOBIN A1C: Hgb A1c MFr Bld: 7.5 % — ABNORMAL HIGH (ref 4.6–6.5)

## 2018-06-16 ENCOUNTER — Other Ambulatory Visit: Payer: Medicare Other

## 2018-06-18 ENCOUNTER — Ambulatory Visit (INDEPENDENT_AMBULATORY_CARE_PROVIDER_SITE_OTHER): Payer: Medicare Other | Admitting: Internal Medicine

## 2018-06-18 ENCOUNTER — Other Ambulatory Visit: Payer: Self-pay

## 2018-06-18 ENCOUNTER — Encounter: Payer: Self-pay | Admitting: Internal Medicine

## 2018-06-18 DIAGNOSIS — R200 Anesthesia of skin: Secondary | ICD-10-CM

## 2018-06-18 DIAGNOSIS — E7800 Pure hypercholesterolemia, unspecified: Secondary | ICD-10-CM | POA: Diagnosis not present

## 2018-06-18 DIAGNOSIS — D0511 Intraductal carcinoma in situ of right breast: Secondary | ICD-10-CM

## 2018-06-18 DIAGNOSIS — F32A Depression, unspecified: Secondary | ICD-10-CM

## 2018-06-18 DIAGNOSIS — G7000 Myasthenia gravis without (acute) exacerbation: Secondary | ICD-10-CM | POA: Diagnosis not present

## 2018-06-18 DIAGNOSIS — E119 Type 2 diabetes mellitus without complications: Secondary | ICD-10-CM

## 2018-06-18 DIAGNOSIS — R208 Other disturbances of skin sensation: Secondary | ICD-10-CM

## 2018-06-18 DIAGNOSIS — I10 Essential (primary) hypertension: Secondary | ICD-10-CM

## 2018-06-18 DIAGNOSIS — Z85820 Personal history of malignant melanoma of skin: Secondary | ICD-10-CM | POA: Diagnosis not present

## 2018-06-18 DIAGNOSIS — Z9109 Other allergy status, other than to drugs and biological substances: Secondary | ICD-10-CM | POA: Diagnosis not present

## 2018-06-18 DIAGNOSIS — F320 Major depressive disorder, single episode, mild: Secondary | ICD-10-CM | POA: Diagnosis not present

## 2018-06-18 NOTE — Progress Notes (Addendum)
Patient ID: Cynthia [name], female   DOB: 06/21/1931, 85 y.o.   MRN: [id number] Virtual Visit via Telephone Note  This visit type was conducted due to national recommendations for restrictions regarding the COVID-19 pandemic (e.g. social distancing).  This format is felt to be most appropriate for this patient at this time.  All issues noted in this document were discussed and addressed.  No physical exam was performed (except for noted visual exam findings with Video Visits).   I connected with Cynthia [name] on 06/18/18 at  2:00 PM EDT by telephone and verified that I am speaking with the correct person using two identifiers. Location patient: home Location provider: work  Persons participating in the telephone visit: patient, provider  I discussed the limitations, risks, security and privacy concerns of performing an evaluation and management service by telephone and the availability of in person appointments. The patient expressed understanding and agreed to proceed.   Reason for visit: scheduled follow up.   HPI: She reports increased stress.  Trying to stay in.  Increased stress related to this.  Overall she feels she is handling things relatively well.  Does not feel needs anything more at this time.  No fever.  No known COVID exposure.  No fever.  No cough, congestion or sob.  No chest pain.  Trying to stay active.  No acid reflux.  No abdominal pain.  Bowels moving.  Saw Dr Bary Castilla 01/2018.  Recommended f/u in one year. States sugars are doing well on her checks.  Reports am sugars averaging 110-120s.  Does report some left foot numbness.  No pain.  No numbness in legs.  No back pain.  No urine or bowel change.  Discussed taking alpha lipoic acid.  Discussed labs.  a1c 7.5.  Hold on making changes.     ROS: See pertinent positives and negatives per HPI.  Past Medical History:  Diagnosis Date  . Allergic rhinitis   . Breast cancer ([place]) 2014   RT LUMPECTOMY  . 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 Resected margins negative. Whole breast radiation  . Cancer ([place]) 12-15-12   left upper arm, malignant melanoma Clark level II, Breslow thickness: 0.35 mm.  . Diabetes mellitus ([place])   . Hypercholesterolemia   . Hypertension   . Myasthenia gravis ([place])    ocular  . Personal history of radiation therapy 2014   Rt. Breast  . S/P radiation therapy 2015   BREAST CA    Past Surgical History:  Procedure Laterality Date  . BREAST BIOPSY Right 2014   DCIS  . BREAST LUMPECTOMY Right 2014   Lumpectomy  . BREAST SURGERY Right 2014   lumpectomy  . EYE SURGERY     Caterac both eyes    Family History  Problem Relation Age of Onset  . Skin cancer Father   . Cancer Father        colon  . Colon cancer Brother   . Cancer Brother        colon  . Breast cancer Other        niece  . Skin cancer Sister     SOCIAL HX: reviewed.    Current Outpatient Medications:  .  ACCU-CHEK SOFTCLIX LANCETS lancets, USE ONCE A DAY, Disp: 100 each, Rfl: 3 .  aspirin 81 MG tablet, Take 81 mg by mouth daily., Disp: , Rfl:  .  atorvastatin (LIPITOR) 20 MG tablet, TAKE 1 TABLET EVERY DAY,  Disp: 90 tablet, Rfl: 2 .  Calcium Carbonate-Vitamin D (CALCIUM 600+D) 600-400 MG-UNIT per tablet, Take 1 tablet by mouth 2 (two) times daily., Disp: , Rfl:  .  glucose blood (ACCU-CHEK AVIVA PLUS) test strip, 1 each by Other route as needed for other. Use as instructed to check blood sugar once daily.DX E11.9, Disp: 100 each, Rfl: 3 .  guaiFENesin (MUCINEX) 600 MG 12 hr tablet, Take 1 tablet (600 mg total) by mouth 2 (two) times daily., Disp: 20 tablet, Rfl: 1 .  hydrochlorothiazide (HYDRODIURIL) 25 MG tablet, TAKE 1 TABLET EVERY DAY, Disp: 90 tablet, Rfl: 0 .  Multiple Vitamins-Minerals (ICAPS AREDS 2) CAPS, Take 1 capsule by mouth 2 (two) times daily., Disp: , Rfl:  .  potassium chloride (K-DUR) 10 MEQ tablet, TAKE 1 TABLET EVERY DAY, Disp: 90 tablet, Rfl: 0 .  ramipril (ALTACE)  10 MG capsule, TAKE 1 CAPSULE EVERY DAY, Disp: 90 capsule, Rfl: 1 .  tamoxifen (NOLVADEX) 20 MG tablet, TAKE 1 TABLET EVERY DAY, Disp: 90 tablet, Rfl: 3  EXAM:  GENERAL: alert. Sounds to be in no acute distress.  Answering questions appropriately.    PSYCH/NEURO: pleasant and cooperative, no obvious depression or anxiety, speech and thought processing grossly intact  ASSESSMENT AND PLAN:  Discussed the following assessment and plan:  Ductal carcinoma in situ (DCIS) of right breast  Mild depression ([facility])  Type 2 diabetes mellitus without complication, without long-term current use of insulin ([facility])  Environmental allergies  History of melanoma  Hypercholesterolemia  Essential hypertension  Myasthenia gravis ([facility])  Numbness of left foot  DCIS (ductal carcinoma in situ) of breast On tamoxifen.  Followed by Dr Bary Castilla.  Last evaluated 01/2018.  Recommended f/u in one year.    Mild depression ([place]) Overall she feels she is handling things relatively well.  Does not feel needs anything more at this time.  Follow.    Diabetes mellitus Discussed recent a1c 7.5.  Low carb diet and exercise.  Follow met b and a1c.    Environmental allergies Overall appeared to be controlled.    History of melanoma Follow by dermatology.    Hypercholesterolemia On lipitor.  Low cholesterol diet and exercise.  Follow lipid panel and liver function tests.    Hypertension Blood pressure has been under reasonable control.  Follow pressures.  Follow metabolic panel.    Myasthenia gravis Followed by Dr Manuella Ghazi.   Numbness of left foot Discussed further w/up.  She declines.  Has seen neurology.  Has tried gabapentin and lyrica.  Trial of alph lipoic acid.  Follow.      I discussed the assessment and treatment plan with the patient. The patient was provided an opportunity to ask questions and all were answered. The patient agreed with the plan and demonstrated an understanding of the  instructions.   The patient was advised to call back or seek an in-person evaluation if the symptoms worsen or if the condition fails to improve as anticipated.  I provided 25 minutes of non-face-to-face time during this encounter.   Einar Pheasant, MD

## 2018-06-20 ENCOUNTER — Encounter: Payer: Self-pay | Admitting: Internal Medicine

## 2018-06-20 DIAGNOSIS — R200 Anesthesia of skin: Secondary | ICD-10-CM | POA: Insufficient documentation

## 2018-06-20 DIAGNOSIS — R208 Other disturbances of skin sensation: Secondary | ICD-10-CM | POA: Insufficient documentation

## 2018-06-20 NOTE — Assessment & Plan Note (Signed)
Overall appeared to be controlled.

## 2018-06-20 NOTE — Assessment & Plan Note (Signed)
Followed by Dr. Shah. 

## 2018-06-20 NOTE — Assessment & Plan Note (Signed)
Follow by dermatology.

## 2018-06-20 NOTE — Assessment & Plan Note (Signed)
Overall she feels she is handling things relatively well.  Does not feel needs anything more at this time.  Follow.

## 2018-06-20 NOTE — Assessment & Plan Note (Signed)
Blood pressure has been under reasonable control.  Follow pressures.  Follow metabolic panel.   

## 2018-06-20 NOTE — Assessment & Plan Note (Signed)
On lipitor.  Low cholesterol diet and exercise.  Follow lipid panel and liver function tests.   

## 2018-06-20 NOTE — Assessment & Plan Note (Signed)
Discussed further w/up.  She declines.  Has seen neurology.  Has tried gabapentin and lyrica.  Trial of alph lipoic acid.  Follow.

## 2018-06-20 NOTE — Assessment & Plan Note (Signed)
Discussed recent a1c 7.5.  Low carb diet and exercise.  Follow met b and a1c.

## 2018-06-20 NOTE — Assessment & Plan Note (Signed)
On tamoxifen.  Followed by Dr Bary Castilla.  Last evaluated 01/2018.  Recommended f/u in one year.

## 2018-06-25 ENCOUNTER — Ambulatory Visit: Payer: Medicare Other

## 2018-06-25 ENCOUNTER — Ambulatory Visit: Payer: Medicare Other | Admitting: Internal Medicine

## 2018-06-28 ENCOUNTER — Ambulatory Visit: Payer: Medicare Other

## 2018-06-28 ENCOUNTER — Ambulatory Visit: Payer: Medicare Other | Admitting: Internal Medicine

## 2018-07-01 ENCOUNTER — Ambulatory Visit (INDEPENDENT_AMBULATORY_CARE_PROVIDER_SITE_OTHER): Payer: Medicare Other

## 2018-07-01 ENCOUNTER — Ambulatory Visit: Payer: Medicare Other | Admitting: Internal Medicine

## 2018-07-01 ENCOUNTER — Other Ambulatory Visit: Payer: Self-pay

## 2018-07-01 DIAGNOSIS — Z0000 Encounter for general adult medical examination without abnormal findings: Secondary | ICD-10-CM | POA: Diagnosis not present

## 2018-07-01 NOTE — Patient Instructions (Addendum)
  Ms. Starn , Thank you for taking time to come for your Medicare Wellness Visit. I appreciate your ongoing commitment to your health goals. Please review the following plan we discussed and let me know if I can assist you in the future.   These are the goals we discussed: Goals      Patient Stated   . DIET - REDUCE SUGAR INTAKE (pt-stated)     Monitor diet       This is a list of the screening recommended for you and due dates:  Health Maintenance  Topic Date Due  . Tetanus Vaccine  11/07/1950  . Pneumonia vaccines (2 of 2 - PPSV23) 11/24/2014  . Eye exam for diabetics  05/07/2018  . Flu Shot  09/25/2018  . Hemoglobin A1C  12/15/2018  . Mammogram  01/07/2019  . Complete foot exam   01/13/2019  . DEXA scan (bone density measurement)  Completed

## 2018-07-01 NOTE — Progress Notes (Signed)
Subjective:   Cynthia [name] is a 85 y.o. female who presents for Medicare Annual (Subsequent) preventive examination.  Review of Systems:  No ROS.  Medicare Wellness Virtual Visit.  Visual/audio telehealth visit, UTA vital signs.   See social history for additional risk factors.  Cardiac Risk Factors include: advanced age (>42men, >47 women);diabetes mellitus;hypertension     Objective:     Vitals: There were no vitals taken for this visit.  There is no height or weight on file to calculate BMI.  Advanced Directives 07/01/2018 06/23/2017 05/20/2016  Does Patient Have a Medical Advance Directive? Yes Yes Yes  Type of Advance Directive Living will [place];Living will Living will;Healthcare Power of Attorney  Does patient want to make changes to medical advance directive? No - Patient declined No - Patient declined No - Patient declined  Copy of [place] in Chart? - No - copy requested No - copy requested    Tobacco Social History   Tobacco Use  Smoking Status Never Smoker  Smokeless Tobacco Never Used     Counseling given: Not Answered   Clinical Intake:  Pre-visit preparation completed: Yes           How often do you need to have someone help you when you read instructions, pamphlets, or other written materials from your doctor or pharmacy?: 2 - Rarely        Past Medical History:  Diagnosis Date  . Allergic rhinitis   . Breast cancer ([place]) 2014   RT LUMPECTOMY  . 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 Resected margins negative. Whole breast radiation  . Cancer ([place]) 12-15-12   left upper arm, malignant melanoma Clark level II, Breslow thickness: 0.35 mm.  . Diabetes mellitus ([place])   . Hypercholesterolemia   . Hypertension   . Myasthenia gravis ([place])    ocular  . Personal history of radiation therapy 2014   Rt. Breast  . S/P radiation therapy 2015   BREAST CA   Past Surgical History:   Procedure Laterality Date  . BREAST BIOPSY Right 2014   DCIS  . BREAST LUMPECTOMY Right 2014   Lumpectomy  . BREAST SURGERY Right 2014   lumpectomy  . EYE SURGERY     Caterac both eyes   Family History  Problem Relation Age of Onset  . Skin cancer Father   . Cancer Father        colon  . Colon cancer Brother   . Cancer Brother        colon  . Breast cancer Other        niece  . Skin cancer Sister    Social History   Socioeconomic History  . Marital status: Divorced    Spouse name: Not on file  . Number of children: Not on file  . Years of education: Not on file  . Highest education level: Not on file  Occupational History  . Not on file  Social Needs  . Financial resource strain: Not hard at all  . Food insecurity:    Worry: Never true    Inability: Never true  . Transportation needs:    Medical: No    Non-medical: No  Tobacco Use  . Smoking status: Never Smoker  . Smokeless tobacco: Never Used  Substance and Sexual Activity  . Alcohol use: No    Alcohol/week: 0.0 standard drinks  . Drug use: No  . Sexual activity:  Never  Lifestyle  . Physical activity:    Days per week: 0 days    Minutes per session: Not on file  . Stress: Not at all  Relationships  . Social connections:    Talks on phone: Not on file    Gets together: Not on file    Attends religious service: Not on file    Active member of club or organization: Not on file    Attends meetings of clubs or organizations: Not on file    Relationship status: Not on file  Other Topics Concern  . Not on file  Social History Narrative  . Not on file    Outpatient Encounter Medications as of 07/01/2018  Medication Sig  . ACCU-CHEK SOFTCLIX LANCETS lancets USE ONCE A DAY  . aspirin 81 MG tablet Take 81 mg by mouth daily.  Marland Kitchen atorvastatin (LIPITOR) 20 MG tablet TAKE 1 TABLET EVERY DAY  . Calcium Carbonate-Vitamin D (CALCIUM 600+D) 600-400 MG-UNIT per tablet Take 1 tablet by mouth 2 (two) times daily.  Marland Kitchen  glucose blood (ACCU-CHEK AVIVA PLUS) test strip 1 each by Other route as needed for other. Use as instructed to check blood sugar once daily.DX E11.9  . guaiFENesin (MUCINEX) 600 MG 12 hr tablet Take 1 tablet (600 mg total) by mouth 2 (two) times daily.  . hydrochlorothiazide (HYDRODIURIL) 25 MG tablet TAKE 1 TABLET EVERY DAY  . Multiple Vitamins-Minerals (ICAPS AREDS 2) CAPS Take 1 capsule by mouth 2 (two) times daily.  . potassium chloride (K-DUR) 10 MEQ tablet TAKE 1 TABLET EVERY DAY  . ramipril (ALTACE) 10 MG capsule TAKE 1 CAPSULE EVERY DAY  . tamoxifen (NOLVADEX) 20 MG tablet TAKE 1 TABLET EVERY DAY   No facility-administered encounter medications on file as of 07/01/2018.     Activities of Daily Living In your present state of health, do you have any difficulty performing the following activities: 07/01/2018  Hearing? N  Vision? N  Difficulty concentrating or making decisions? N  Walking or climbing stairs? Y  Comment Unsteady gait. Cane in use when ambulating.  Dressing or bathing? N  Doing errands, shopping? N  Preparing Food and eating ? N  Using the Toilet? N  In the past six months, have you accidently leaked urine? N  Do you have problems with loss of bowel control? N  Managing your Medications? N  Managing your Finances? N  Housekeeping or managing your Housekeeping? N  Some recent data might be hidden    Patient Care Team: Einar Pheasant, MD as PCP - General (Internal Medicine) Bary Castilla, Forest Gleason, MD (General Surgery) Einar Pheasant, MD (Internal Medicine) Barrie Dunker, MD (Dermatology)    Assessment:   This is a routine wellness examination for Cynthia [name].  I connected with patient 07/01/18 at  1:30 PM EDT by a video/audio enabled telemedicine application and verified that I am speaking with the correct person using two identifiers. Patient stated full name and DOB. Patient gave permission to continue with virtual visit. Patient's location was at home and  Nurse's location was at [place] office.   Health Screenings  Mammogram - 12/2017 Colonoscopy - 10/2013 Bone Density - 07/2016 Glaucoma -none Hearing -demonstrates normal hearing during visit. Hemoglobin A1C - 05/2018 (7.5) Cholesterol - 05/2018 (141) Dental- visits every 6 months Vision- visits every 8 weeks  Social  Alcohol intake - no        Smoking history- never   Smokers in home? none Illicit drug use? none Exercise -walking around  the home Diet - regular diet Sexually Active -never BMI- discussed the importance of a healthy diet, water intake and the benefits of aerobic exercise.  Educational material provided.   Safety  Patient feels safe at home- yes, lives alone. Patient does have smoke detectors at home- yes Patient does wear sunscreen or protective clothing when in direct sunlight -yes Patient does wear seat belt when in a moving vehicle -yes  Covid-19 precautions and sickness symptoms discussed.   Activities of Daily Living Patient denies needing assistance with: driving, household chores, feeding themselves, getting from bed to chair, getting to the toilet, bathing/showering, dressing, managing money, or preparing meals.  No new identified risk were noted.    Depression Screen Patient denies losing interest in daily life, feeling hopeless, or crying easily over simple problems.   Medication-taking as directed and without issues.   Fall Screen Patient denies being afraid of falling or falling in the last year.   Memory Screen Patient is alert.  Patient denies difficulty focusing, concentrating or misplacing items. Correctly identified the president of the [place] , season and recall. Patient likes to reads the newspaper and bible for brain stimulation.  Immunizations The following Immunizations were discussed: Influenza, shingles, pneumonia, and tetanus.   Other Providers Patient Care Team: Einar Pheasant, MD as PCP - General (Internal Medicine) Bary Castilla  Forest Gleason, MD (General Surgery) Einar Pheasant, MD (Internal Medicine) Barrie Dunker, MD (Dermatology)  Exercise Activities and Dietary recommendations Current Exercise Habits: The patient does not participate in regular exercise at present  Goals      Patient Stated   . DIET - REDUCE SUGAR INTAKE (pt-stated)     Monitor diet       Fall Risk Fall Risk  07/01/2018 01/26/2018 06/23/2017 05/20/2016 03/11/2016  Falls in the past year? 0 0 No Yes Yes  Number falls in past yr: - - - 1 2 or more  Injury with Fall? - - - No Yes  Risk for fall due to : - - - - History of fall(s)  Follow up - Falls evaluation completed - Falls prevention discussed -   Depression Screen PHQ 2/9 Scores 07/01/2018 06/23/2017 05/20/2016 03/11/2016  PHQ - 2 Score 0 0 0 0     Cognitive Function     6CIT Screen 07/01/2018 06/23/2017 05/20/2016  What Year? 0 points 0 points 0 points  What month? 0 points 0 points 0 points  What time? 0 points 0 points 0 points  Count back from 20 0 points 0 points 0 points  Months in reverse 0 points 0 points 0 points  Repeat phrase 0 points 0 points 0 points  Total Score 0 0 0    Immunization History  Administered Date(s) Administered  . Influenza Split 12/20/2011  . Influenza, High Dose Seasonal PF 12/15/2016, 01/12/2018  . Influenza,inj,Quad PF,6+ Mos 11/08/2012, 11/23/2013, 12/14/2014  . Influenza-Unspecified 12/14/2014, 12/10/2015  . Pneumococcal Conjugate-13 11/23/2013   Screening Tests Health Maintenance  Topic Date Due  . TETANUS/TDAP  11/07/1950  . PNA vac Low Risk Adult (2 of 2 - PPSV23) 11/24/2014  . OPHTHALMOLOGY EXAM  05/07/2018  . INFLUENZA VACCINE  09/25/2018  . HEMOGLOBIN A1C  12/15/2018  . MAMMOGRAM  01/07/2019  . FOOT EXAM  01/13/2019  . DEXA SCAN  Completed      Plan:    End of life planning; Advance aging; Advanced directives discussed.  Copy of current HCPOA/Living Will requested.    I have personally reviewed and noted the  following in  the patient's chart:   . Medical and social history . Use of alcohol, tobacco or illicit drugs  . Current medications and supplements . Functional ability and status . Nutritional status . Physical activity . Advanced directives . List of other physicians . Hospitalizations, surgeries, and ER visits in previous 12 months . Vitals . Screenings to include cognitive, depression, and falls . Referrals and appointments  In addition, I have reviewed and discussed with patient certain preventive protocols, quality metrics, and best practice recommendations. A written personalized care plan for preventive services as well as general preventive health recommendations were provided to patient.     Varney Biles, LPN  06/26/7941   Reviewed above information.  Agree with assessment and plan.    Dr Nicki Reaper

## 2018-07-29 DIAGNOSIS — H353221 Exudative age-related macular degeneration, left eye, with active choroidal neovascularization: Secondary | ICD-10-CM | POA: Diagnosis not present

## 2018-09-14 ENCOUNTER — Other Ambulatory Visit: Payer: Self-pay | Admitting: Internal Medicine

## 2018-09-15 ENCOUNTER — Encounter: Payer: Self-pay | Admitting: General Surgery

## 2018-09-20 ENCOUNTER — Other Ambulatory Visit: Payer: Self-pay | Admitting: Internal Medicine

## 2018-09-30 ENCOUNTER — Other Ambulatory Visit: Payer: Self-pay

## 2018-09-30 ENCOUNTER — Ambulatory Visit (INDEPENDENT_AMBULATORY_CARE_PROVIDER_SITE_OTHER): Payer: Medicare Other | Admitting: Internal Medicine

## 2018-09-30 DIAGNOSIS — E119 Type 2 diabetes mellitus without complications: Secondary | ICD-10-CM

## 2018-09-30 DIAGNOSIS — I10 Essential (primary) hypertension: Secondary | ICD-10-CM

## 2018-09-30 DIAGNOSIS — G7000 Myasthenia gravis without (acute) exacerbation: Secondary | ICD-10-CM

## 2018-09-30 DIAGNOSIS — Z9109 Other allergy status, other than to drugs and biological substances: Secondary | ICD-10-CM

## 2018-09-30 DIAGNOSIS — E7800 Pure hypercholesterolemia, unspecified: Secondary | ICD-10-CM

## 2018-09-30 DIAGNOSIS — D0511 Intraductal carcinoma in situ of right breast: Secondary | ICD-10-CM

## 2018-09-30 DIAGNOSIS — F320 Major depressive disorder, single episode, mild: Secondary | ICD-10-CM

## 2018-09-30 DIAGNOSIS — F32A Depression, unspecified: Secondary | ICD-10-CM

## 2018-09-30 MED ORDER — TRAZODONE HCL 50 MG PO TABS: ORAL_TABLET | ORAL | 1 refills | Status: DC

## 2018-09-30 NOTE — Progress Notes (Signed)
Patient ID: Cynthia [name], female   DOB: [birth year], 85 y.o.   MRN: [id number]   Virtual Visit via telephone Note  This visit type was conducted due to national recommendations for restrictions regarding the COVID-19 pandemic (e.g. social distancing).  This format is felt to be most appropriate for this patient at this time.  All issues noted in this document were discussed and addressed.  No physical exam was performed (except for noted visual exam findings with Video Visits).   I connected with [name] by telephone and verified that I am speaking with the correct person using two identifiers. Location patient: home Location provider: work Persons participating in the telephonevisit: patient, provider  I discussed the limitations, risks, security and privacy concerns of performing an evaluation and management service by telephone and the availability of in person appointments. The patient expressed understanding and agreed to proceed.   Reason for visit: scheduled follow up  HPI: She reports she is doing relatively well.  States her sugars are doing relatively well.  AM sugars 115-125.  Trying to watch her diet.  No chest pain.  No sob.  No acid reflux.  No abdominal pain.  Bowels moving.  Seeing Dr Nehemiah Massed - rash.  Improved.  Blood pressure doing well.  Staying in due to covid restrictions.  No fever. No chest congestion.  No cough.  States her pressures are averaging 130s-140s/70s.     ROS: See pertinent positives and negatives per HPI.  Past Medical History:  Diagnosis Date  . Allergic rhinitis   . Breast cancer ([place]) 2014   RT LUMPECTOMY  . 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 Resected margins negative. Whole breast radiation  . Cancer ([place]) 12-15-12   left upper arm, malignant melanoma Clark level II, Breslow thickness: 0.35 mm.  . Diabetes mellitus ([place])   . Hypercholesterolemia   . Hypertension   . Myasthenia gravis ([place])    ocular  .  Personal history of radiation therapy 2014   Rt. Breast  . S/P radiation therapy 2015   BREAST CA    Past Surgical History:  Procedure Laterality Date  . BREAST BIOPSY Right 2014   DCIS  . BREAST LUMPECTOMY Right 2014   Lumpectomy  . BREAST SURGERY Right 2014   lumpectomy  . EYE SURGERY     Caterac both eyes    Family History  Problem Relation Age of Onset  . Skin cancer Father   . Cancer Father        colon  . Colon cancer Brother   . Cancer Brother        colon  . Breast cancer Other        niece  . Skin cancer Sister     SOCIAL HX: reviewed.    Current Outpatient Medications:  .  ACCU-CHEK SOFTCLIX LANCETS lancets, USE ONCE A DAY, Disp: 100 each, Rfl: 3 .  aspirin 81 MG tablet, Take 81 mg by mouth daily., Disp: , Rfl:  .  atorvastatin (LIPITOR) 20 MG tablet, TAKE 1 TABLET EVERY DAY, Disp: 90 tablet, Rfl: 2 .  Calcium Carbonate-Vitamin D (CALCIUM 600+D) 600-400 MG-UNIT per tablet, Take 1 tablet by mouth 2 (two) times daily., Disp: , Rfl:  .  glucose blood (ACCU-CHEK AVIVA PLUS) test strip, 1 each by Other route as needed for other. Use as instructed to check blood sugar once daily.DX E11.9, Disp: 100 each, Rfl: 3 .  guaiFENesin (MUCINEX) 600 MG  12 hr tablet, Take 1 tablet (600 mg total) by mouth 2 (two) times daily., Disp: 20 tablet, Rfl: 1 .  hydrochlorothiazide (HYDRODIURIL) 25 MG tablet, TAKE 1 TABLET EVERY DAY, Disp: 90 tablet, Rfl: 0 .  Multiple Vitamins-Minerals (ICAPS AREDS 2) CAPS, Take 1 capsule by mouth 2 (two) times daily., Disp: , Rfl:  .  potassium chloride (K-DUR) 10 MEQ tablet, TAKE 1 TABLET EVERY DAY, Disp: 90 tablet, Rfl: 0 .  ramipril (ALTACE) 10 MG capsule, TAKE 1 CAPSULE EVERY DAY, Disp: 90 capsule, Rfl: 1 .  tamoxifen (NOLVADEX) 20 MG tablet, TAKE 1 TABLET EVERY DAY, Disp: 90 tablet, Rfl: 3 .  traZODone (DESYREL) 50 MG tablet, Take 1 1/2 - 2 tablets q hs, Disp: 60 tablet, Rfl: 1  EXAM:  VITALS per patient if applicable:  [id number]   GENERAL: alert.  Sounds to be in no acute distress.  Answering questions appropriately.    PSYCH/NEURO: pleasant and cooperative, no obvious depression or anxiety, speech and thought processing grossly intact  ASSESSMENT AND PLAN:  Discussed the following assessment and plan:  DCIS (ductal carcinoma in situ) of breast On tamoxifen.  Followed by Dr Bary Castilla.  Last evaluated 01/2018.  Recommended f/u in one year.    Environmental allergies Controlled.    Diabetes mellitus Sugars as outlined.  Low carb diet and exercise.  Follow met b and a1c.  Up to date with eye checks.   Hypercholesterolemia On lipitor.  Low cholesterol diet and exercise.  Follow lipid panel and liver function tests.    Hypertension Blood pressure as outlined.  She is unsure if her cuff correlates.  She will bring her cuff in to the office when she comes in for labs to see if cuff correlates.    Mild depression ([place]) Overall handling things relatively well.  Follow.  Does not feel needs any further intervention.    Myasthenia gravis Followed by Dr Manuella Ghazi.  Stable.      I discussed the assessment and treatment plan with the patient. The patient was provided an opportunity to ask questions and all were answered. The patient agreed with the plan and demonstrated an understanding of the instructions.   The patient was advised to call back or seek an in-person evaluation if the symptoms worsen or if the condition fails to improve as anticipated.  I provided 25 minutes of non-face-to-face time during this encounter.   Einar Pheasant, MD

## 2018-10-03 ENCOUNTER — Encounter: Payer: Self-pay | Admitting: Internal Medicine

## 2018-10-03 NOTE — Assessment & Plan Note (Addendum)
Followed by Dr Shah.  Stable.   

## 2018-10-03 NOTE — Assessment & Plan Note (Signed)
Overall handling things relatively well.  Follow.  Does not feel needs any further intervention.

## 2018-10-03 NOTE — Assessment & Plan Note (Signed)
On lipitor.  Low cholesterol diet and exercise.  Follow lipid panel and liver function tests.   

## 2018-10-03 NOTE — Assessment & Plan Note (Signed)
Blood pressure as outlined.  She is unsure if her cuff correlates.  She will bring her cuff in to the office when she comes in for labs to see if cuff correlates.

## 2018-10-03 NOTE — Assessment & Plan Note (Signed)
On tamoxifen.  Followed by Dr Bary Castilla.  Last evaluated 01/2018.  Recommended f/u in one year.

## 2018-10-03 NOTE — Assessment & Plan Note (Signed)
Sugars as outlined.  Low carb diet and exercise.  Follow met b and a1c.  Up to date with eye checks.   

## 2018-10-03 NOTE — Assessment & Plan Note (Signed)
Controlled.  

## 2018-10-07 DIAGNOSIS — L309 Dermatitis, unspecified: Secondary | ICD-10-CM | POA: Diagnosis not present

## 2018-10-07 DIAGNOSIS — L821 Other seborrheic keratosis: Secondary | ICD-10-CM | POA: Diagnosis not present

## 2018-10-07 DIAGNOSIS — Z1283 Encounter for screening for malignant neoplasm of skin: Secondary | ICD-10-CM | POA: Diagnosis not present

## 2018-10-07 DIAGNOSIS — I878 Other specified disorders of veins: Secondary | ICD-10-CM | POA: Diagnosis not present

## 2018-10-07 DIAGNOSIS — L578 Other skin changes due to chronic exposure to nonionizing radiation: Secondary | ICD-10-CM | POA: Diagnosis not present

## 2018-10-07 DIAGNOSIS — L570 Actinic keratosis: Secondary | ICD-10-CM | POA: Diagnosis not present

## 2018-10-07 DIAGNOSIS — L820 Inflamed seborrheic keratosis: Secondary | ICD-10-CM | POA: Diagnosis not present

## 2018-10-07 DIAGNOSIS — D692 Other nonthrombocytopenic purpura: Secondary | ICD-10-CM | POA: Diagnosis not present

## 2018-10-07 DIAGNOSIS — D1800 Hemangioma unspecified site: Secondary | ICD-10-CM | POA: Diagnosis not present

## 2018-10-11 ENCOUNTER — Other Ambulatory Visit: Payer: Self-pay | Admitting: Internal Medicine

## 2018-10-13 DIAGNOSIS — H353221 Exudative age-related macular degeneration, left eye, with active choroidal neovascularization: Secondary | ICD-10-CM | POA: Diagnosis not present

## 2018-10-25 ENCOUNTER — Other Ambulatory Visit: Payer: Self-pay | Admitting: Internal Medicine

## 2018-11-09 ENCOUNTER — Other Ambulatory Visit: Payer: Self-pay

## 2018-11-09 DIAGNOSIS — Z1231 Encounter for screening mammogram for malignant neoplasm of breast: Secondary | ICD-10-CM

## 2018-11-29 ENCOUNTER — Other Ambulatory Visit: Payer: Self-pay | Admitting: General Surgery

## 2018-11-29 DIAGNOSIS — D0511 Intraductal carcinoma in situ of right breast: Secondary | ICD-10-CM

## 2018-11-29 DIAGNOSIS — Z1231 Encounter for screening mammogram for malignant neoplasm of breast: Secondary | ICD-10-CM

## 2018-12-02 ENCOUNTER — Other Ambulatory Visit: Payer: Self-pay | Admitting: Internal Medicine

## 2018-12-08 DIAGNOSIS — H353221 Exudative age-related macular degeneration, left eye, with active choroidal neovascularization: Secondary | ICD-10-CM | POA: Diagnosis not present

## 2018-12-08 DIAGNOSIS — E119 Type 2 diabetes mellitus without complications: Secondary | ICD-10-CM | POA: Diagnosis not present

## 2018-12-08 LAB — HM DIABETES EYE EXAM

## 2018-12-09 DIAGNOSIS — M778 Other enthesopathies, not elsewhere classified: Secondary | ICD-10-CM | POA: Diagnosis not present

## 2018-12-09 DIAGNOSIS — E119 Type 2 diabetes mellitus without complications: Secondary | ICD-10-CM | POA: Diagnosis not present

## 2018-12-09 DIAGNOSIS — M79674 Pain in right toe(s): Secondary | ICD-10-CM | POA: Diagnosis not present

## 2018-12-09 DIAGNOSIS — L600 Ingrowing nail: Secondary | ICD-10-CM | POA: Diagnosis not present

## 2018-12-09 DIAGNOSIS — B351 Tinea unguium: Secondary | ICD-10-CM | POA: Diagnosis not present

## 2018-12-09 DIAGNOSIS — L851 Acquired keratosis [keratoderma] palmaris et plantaris: Secondary | ICD-10-CM | POA: Diagnosis not present

## 2018-12-09 DIAGNOSIS — M79675 Pain in left toe(s): Secondary | ICD-10-CM | POA: Diagnosis not present

## 2018-12-25 DIAGNOSIS — Z23 Encounter for immunization: Secondary | ICD-10-CM | POA: Diagnosis not present

## 2018-12-29 ENCOUNTER — Encounter: Payer: Self-pay | Admitting: Internal Medicine

## 2019-01-03 ENCOUNTER — Other Ambulatory Visit: Payer: Self-pay | Admitting: Internal Medicine

## 2019-01-10 ENCOUNTER — Ambulatory Visit
Admission: RE | Admit: 2019-01-10 | Discharge: 2019-01-10 | Disposition: A | Discharge status: Home / Self Care | Payer: Medicare Other | Source: Ambulatory Visit | Attending: Surgery | Admitting: Surgery

## 2019-01-10 DIAGNOSIS — Z1231 Encounter for screening mammogram for malignant neoplasm of breast: Secondary | ICD-10-CM | POA: Diagnosis not present

## 2019-01-13 DIAGNOSIS — Z853 Personal history of malignant neoplasm of breast: Secondary | ICD-10-CM | POA: Diagnosis not present

## 2019-01-17 ENCOUNTER — Ambulatory Visit: Payer: Medicare Other | Admitting: Internal Medicine

## 2019-01-18 ENCOUNTER — Other Ambulatory Visit (INDEPENDENT_AMBULATORY_CARE_PROVIDER_SITE_OTHER): Payer: Medicare Other

## 2019-01-18 ENCOUNTER — Other Ambulatory Visit: Payer: Self-pay

## 2019-01-18 DIAGNOSIS — E7800 Pure hypercholesterolemia, unspecified: Secondary | ICD-10-CM

## 2019-01-18 DIAGNOSIS — E119 Type 2 diabetes mellitus without complications: Secondary | ICD-10-CM

## 2019-01-18 LAB — LIPID PANEL
Cholesterol: 151 mg/dL (ref 0–200)
HDL: 44 mg/dL (ref >39.00)
LDL Cholesterol: 83 mg/dL (ref 0–99)
NonHDL: 107.1
Total CHOL/HDL Ratio: 3
Triglycerides: 120 mg/dL (ref 0.0–149.0)
VLDL: 24 mg/dL (ref 0.0–40.0)

## 2019-01-18 LAB — BASIC METABOLIC PANEL
BUN: 10 mg/dL (ref 6–23)
CO2: 31 mEq/L (ref 19–32)
Calcium: 9.8 mg/dL (ref 8.4–10.5)
Chloride: 102 mEq/L (ref 96–112)
Creatinine, Ser: 0.87 mg/dL (ref 0.40–1.20)
GFR: 61.85 mL/min (ref >60.00)
Glucose, Bld: 124 mg/dL — ABNORMAL HIGH (ref 70–99)
Potassium: 3.9 mEq/L (ref 3.5–5.1)
Sodium: 138 mEq/L (ref 135–145)

## 2019-01-18 LAB — HEPATIC FUNCTION PANEL
ALT: 13 U/L (ref 0–35)
AST: 16 U/L (ref 0–37)
Albumin: 3.6 g/dL (ref 3.5–5.2)
Alkaline Phosphatase: 99 U/L (ref 39–117)
Bilirubin, Direct: 0.1 mg/dL (ref 0.0–0.3)
Total Bilirubin: 0.5 mg/dL (ref 0.2–1.2)
Total Protein: 6.5 g/dL (ref 6.0–8.3)

## 2019-01-18 LAB — HEMOGLOBIN A1C: Hgb A1c MFr Bld: 7.2 % — ABNORMAL HIGH (ref 4.6–6.5)

## 2019-01-19 ENCOUNTER — Ambulatory Visit: Payer: Medicare Other | Admitting: Surgery

## 2019-01-24 ENCOUNTER — Other Ambulatory Visit: Payer: Self-pay

## 2019-01-24 ENCOUNTER — Encounter: Payer: Self-pay | Admitting: Internal Medicine

## 2019-01-24 ENCOUNTER — Ambulatory Visit (INDEPENDENT_AMBULATORY_CARE_PROVIDER_SITE_OTHER): Payer: Medicare Other | Admitting: Internal Medicine

## 2019-01-24 DIAGNOSIS — E119 Type 2 diabetes mellitus without complications: Secondary | ICD-10-CM | POA: Diagnosis not present

## 2019-01-24 DIAGNOSIS — F32A Depression, unspecified: Secondary | ICD-10-CM

## 2019-01-24 DIAGNOSIS — D0511 Intraductal carcinoma in situ of right breast: Secondary | ICD-10-CM

## 2019-01-24 DIAGNOSIS — E7800 Pure hypercholesterolemia, unspecified: Secondary | ICD-10-CM | POA: Diagnosis not present

## 2019-01-24 DIAGNOSIS — F320 Major depressive disorder, single episode, mild: Secondary | ICD-10-CM | POA: Diagnosis not present

## 2019-01-24 DIAGNOSIS — Z9109 Other allergy status, other than to drugs and biological substances: Secondary | ICD-10-CM

## 2019-01-24 DIAGNOSIS — I10 Essential (primary) hypertension: Secondary | ICD-10-CM | POA: Diagnosis not present

## 2019-01-24 DIAGNOSIS — G7000 Myasthenia gravis without (acute) exacerbation: Secondary | ICD-10-CM

## 2019-01-24 NOTE — Progress Notes (Signed)
Patient ID: Cynthia [name], female   DOB: 28-Feb-1931, 85 y.o.   MRN: [id number]   Virtual Visit via telephone Note  This visit type was conducted due to national recommendations for restrictions regarding the COVID-19 pandemic (e.g. social distancing).  This format is felt to be most appropriate for this patient at this time.  All issues noted in this document were discussed and addressed.  No physical exam was performed (except for noted visual exam findings with Video Visits).   I connected with [name] by telephone and verified that I am speaking with the correct person using two identifiers. Location patient: home Location provider: work  Persons participating in the telephone visit: patient, provider  I discussed the limitations, risks, security and privacy concerns of performing an evaluation and management service by telephone and the availability of in person appointments.  The patient expressed understanding and agreed to proceed.   Reason for visit: scheduled follow up.    HPI: Increased stress.  Some depression.  Discussed with her today.  Discussed referral to psychiatry. She declines.  Discussed medication.  She declines.  Assures me she would not hurt herself.  Stays active.  No chest pain.  No sob.  No acid reflux.  No abdominal pain.  Bowels moving.  Does reports noticing she loses her balance.  More unsteady.  No falls.  No weakness.  Discussed physical therapy.  She declines.  Desires no further intervention or w/up.  Saw Dr Cleda Mccreedy 11/2018.  Discussed labs.  Blood pressure has been doing well.  Diagnosed with DCIS.  Followed by Dr Bary Castilla.  Last evaluated 01/2018.  Recommended f/u in one year.       ROS: See pertinent positives and negatives per HPI.  Past Medical History:  Diagnosis Date  . Allergic rhinitis   . Breast cancer ([place]) 2014   RT LUMPECTOMY  . 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 Resected margins negative. Whole breast  radiation  . Cancer ([place]) 12-15-12   left upper arm, malignant melanoma Clark level II, Breslow thickness: 0.35 mm.  . Diabetes mellitus ([place])   . Hypercholesterolemia   . Hypertension   . Myasthenia gravis ([place])    ocular  . Personal history of radiation therapy 2014   Rt. Breast  . S/P radiation therapy 2015   BREAST CA    Past Surgical History:  Procedure Laterality Date  . BREAST BIOPSY Right 2014   DCIS  . BREAST LUMPECTOMY Right 2014   Lumpectomy  . BREAST SURGERY Right 2014   lumpectomy  . EYE SURGERY     Caterac both eyes    Family History  Problem Relation Age of Onset  . Skin cancer Father   . Cancer Father        colon  . Colon cancer Brother   . Cancer Brother        colon  . Breast cancer Other        niece  . Skin cancer Sister     SOCIAL HX: reviewed.    Current Outpatient Medications:  .  ACCU-CHEK SOFTCLIX LANCETS lancets, USE ONCE A DAY, Disp: 100 each, Rfl: 3 .  aspirin 81 MG tablet, Take 81 mg by mouth daily., Disp: , Rfl:  .  atorvastatin (LIPITOR) 20 MG tablet, TAKE 1 TABLET EVERY DAY, Disp: 90 tablet, Rfl: 2 .  Calcium Carbonate-Vitamin D (CALCIUM 600+D) 600-400 MG-UNIT per tablet, Take 1 tablet by mouth 2 (two)  times daily., Disp: , Rfl:  .  CRANBERRY PO, Take by mouth daily., Disp: , Rfl:  .  glucose blood (ACCU-CHEK AVIVA PLUS) test strip, 1 each by Other route as needed for other. Use as instructed to check blood sugar once daily.DX E11.9, Disp: 100 each, Rfl: 3 .  guaiFENesin (MUCINEX) 600 MG 12 hr tablet, Take 1 tablet (600 mg total) by mouth 2 (two) times daily., Disp: 20 tablet, Rfl: 1 .  hydrochlorothiazide (HYDRODIURIL) 25 MG tablet, TAKE 1 TABLET EVERY DAY, Disp: 90 tablet, Rfl: 0 .  Multiple Vitamins-Minerals (ICAPS AREDS 2) CAPS, Take 1 capsule by mouth 2 (two) times daily., Disp: , Rfl:  .  Multiple Vitamins-Minerals (PRESERVISION AREDS 2+MULTI VIT PO), Take by mouth., Disp: , Rfl:  .  potassium chloride (KLOR-CON) 10 MEQ  tablet, TAKE 1 TABLET EVERY DAY, Disp: 90 tablet, Rfl: 0 .  ramipril (ALTACE) 10 MG capsule, TAKE 1 CAPSULE EVERY DAY, Disp: 90 capsule, Rfl: 1 .  traZODone (DESYREL) 50 MG tablet, TAKE 1 1/2 - 2 TABLETS AT BEDTIME, Disp: 180 tablet, Rfl: 1 .  tamoxifen (NOLVADEX) 20 MG tablet, TAKE 1 TABLET EVERY DAY (Patient not taking: Reported on 01/24/2019), Disp: 90 tablet, Rfl: 3  EXAM:  GENERAL: alert.  Sounds to be in no acute distress.  Answering questions appropriately.    PSYCH/NEURO: pleasant and cooperative, no obvious depression or anxiety, speech and thought processing grossly intact  ASSESSMENT AND PLAN:  Discussed the following assessment and plan:  DCIS (ductal carcinoma in situ) of breast Has been followed by Dr Bary Castilla.  Last evaluated 01/2018.  Mammogram 01/10/19 - Birads I.  Schedule f/u with Dr Bary Castilla.    Diabetes mellitus Low carb diet and exercise.  Follow met b and a1c.  a1c just checked 7.2.    Environmental allergies Controlled.    Hypercholesterolemia On lipitor.  Low cholesterol diet and exercise.  Follow lipid panel and liver function tests.    Hypertension Blood pressure has been doing well.  Continue current medication regimen.  Follow pressures.  Follow metabolic panel.   Mild depression ([place]) Discussed with her today.  Discussed referral to psychiatry and medication.  She declines.  Assures me she would not hurt herself.  Follow closely. She declines any further intervention.    Myasthenia gravis Has been followed by Dr Manuella Ghazi.  Stable.     I discussed the assessment and treatment plan with the patient. The patient was provided an opportunity to ask questions and all were answered. The patient agreed with the plan and demonstrated an understanding of the instructions.   The patient was advised to call back or seek an in-person evaluation if the symptoms worsen or if the condition fails to improve as anticipated.  I provided 25 minutes of non-face-to-face  time during this encounter.   Einar Pheasant, MD

## 2019-01-29 ENCOUNTER — Encounter: Payer: Self-pay | Admitting: Internal Medicine

## 2019-01-29 NOTE — Assessment & Plan Note (Signed)
Discussed with her today.  Discussed referral to psychiatry and medication.  She declines.  Assures me she would not hurt herself.  Follow closely. She declines any further intervention.

## 2019-01-29 NOTE — Assessment & Plan Note (Signed)
Has been followed by Dr Shah.  Stable.  

## 2019-01-29 NOTE — Assessment & Plan Note (Signed)
Has been followed by Dr Bary Castilla.  Last evaluated 01/2018.  Mammogram 01/10/19 - Birads I.  Schedule f/u with Dr Bary Castilla.

## 2019-01-29 NOTE — Assessment & Plan Note (Signed)
Low carb diet and exercise.  Follow met b and a1c.  a1c just checked 7.2.

## 2019-01-29 NOTE — Assessment & Plan Note (Signed)
Controlled.  

## 2019-01-29 NOTE — Assessment & Plan Note (Signed)
Blood pressure has been doing well.  Continue current medication regimen.  Follow pressures.  Follow metabolic panel.  

## 2019-01-29 NOTE — Assessment & Plan Note (Signed)
On lipitor.  Low cholesterol diet and exercise.  Follow lipid panel and liver function tests.   

## 2019-02-02 DIAGNOSIS — H353221 Exudative age-related macular degeneration, left eye, with active choroidal neovascularization: Secondary | ICD-10-CM | POA: Diagnosis not present

## 2019-02-11 ENCOUNTER — Telehealth: Payer: Self-pay | Admitting: Internal Medicine

## 2019-02-11 NOTE — Telephone Encounter (Signed)
149/67 155/73 p- 69 171/85 p-69 191/77 p- 67 169/81 p- 70 187/78 p-67 166/72 p-64 172/73 p- 85  Patient has been spot checking her blood pressures because she was advised to do so and f/u in 4 weeks. She is feeling ok and confirmed no acute issues. She is taking ramipril 10 mg q day and HCTZ 25 mg q day. No missed doses. Did not know if you wanted to adjust her medication, have her spot check pressures and f/u at scheduled visit on 1/4 or I can move her appt up to next week.

## 2019-02-11 NOTE — Telephone Encounter (Signed)
Pt called and stated that her BP is still running high 156/66 153/63 158/70 191/67 187/67

## 2019-02-12 NOTE — Telephone Encounter (Signed)
Blood pressures are remaining higher than goal.  Have her start amlodipine 5mg  q day.  Confirm doing ok.  Continue to monitor blood pressures and keep f/u appt. Any problems, let us know.

## 2019-02-14 ENCOUNTER — Other Ambulatory Visit: Payer: Self-pay

## 2019-02-14 MED ORDER — AMLODIPINE BESYLATE 5 MG PO TABS: 5.0000 mg | ORAL_TABLET | Freq: Every day | ORAL | 1 refills | Status: DC

## 2019-02-14 NOTE — Telephone Encounter (Signed)
Sent rx in for amlodipine and left detailed message for patient to call me back

## 2019-02-15 ENCOUNTER — Other Ambulatory Visit: Payer: Self-pay | Admitting: Internal Medicine

## 2019-02-15 NOTE — Telephone Encounter (Signed)
I spoke with patient & she did pick up amlodipine. After taking yesterday afternoon BP was 132/60 P 72. Then this morning BP was 152/70 P70 she was unsure if this was taken at least 1hr after taking amlodipine though. She will continue to monitor BP until appointment on 02/28/19.

## 2019-02-15 NOTE — Telephone Encounter (Signed)
Notify pt that she just started amlodipine.  Continue to spot check her pressure.  Should see trend down.  If any problems, let us know.

## 2019-02-15 NOTE — Telephone Encounter (Signed)
Patient aware.

## 2019-02-23 ENCOUNTER — Telehealth: Payer: Self-pay | Admitting: Internal Medicine

## 2019-02-23 NOTE — Telephone Encounter (Signed)
Pt called to give BP readings before her appt on 02/28/19  22nd M-152/77  N- 143/74 23rd  M-171/69  N- 151/70 24th  M-148/64  N- 141/67 25th  M-148/66  N- 125/63 26th M-153/65   N- 137/69 27th M-137-62   N-149/63 28th M-157/70   N- 134/63 29th M-142/62   N- 171/75 30th M-156/73

## 2019-02-28 ENCOUNTER — Ambulatory Visit (INDEPENDENT_AMBULATORY_CARE_PROVIDER_SITE_OTHER): Payer: Medicare Other | Admitting: Internal Medicine

## 2019-02-28 ENCOUNTER — Other Ambulatory Visit: Payer: Self-pay

## 2019-02-28 ENCOUNTER — Encounter: Payer: Self-pay | Admitting: Internal Medicine

## 2019-02-28 ENCOUNTER — Ambulatory Visit: Payer: Medicare Other | Admitting: Internal Medicine

## 2019-02-28 DIAGNOSIS — E119 Type 2 diabetes mellitus without complications: Secondary | ICD-10-CM | POA: Diagnosis not present

## 2019-02-28 DIAGNOSIS — F320 Major depressive disorder, single episode, mild: Secondary | ICD-10-CM | POA: Diagnosis not present

## 2019-02-28 DIAGNOSIS — I10 Essential (primary) hypertension: Secondary | ICD-10-CM

## 2019-02-28 DIAGNOSIS — E7800 Pure hypercholesterolemia, unspecified: Secondary | ICD-10-CM | POA: Diagnosis not present

## 2019-02-28 DIAGNOSIS — F32A Depression, unspecified: Secondary | ICD-10-CM

## 2019-02-28 DIAGNOSIS — G7000 Myasthenia gravis without (acute) exacerbation: Secondary | ICD-10-CM

## 2019-02-28 NOTE — Progress Notes (Signed)
Patient ID: Cynthia [name], female   DOB: Jan 14, 1932, 86 y.o.   MRN: [id number]   Virtual Visit via telephone Note  This visit type was conducted due to national recommendations for restrictions regarding the COVID-19 pandemic (e.g. social distancing).  This format is felt to be most appropriate for this patient at this time.  All issues noted in this document were discussed and addressed.  No physical exam was performed (except for noted visual exam findings with Video Visits).   I connected with [place] by telephone and verified that I am speaking with the correct person using two identifiers. Location patient: home Location provider: work  Persons participating in the telephone visit: patient, provider  I discussed the limitations, risks, security and privacy concerns of performing an evaluation and management service by telephone and the availability of in person appointments.  The patient expressed understanding and agreed to proceed.   Reason for visit: scheduled follow up  HPI: She reports she is doing relatively well.  Has noticed some pain in her left hip.  Notices more when she has been sitting for a while and then stands.  Once she starts moving - better.  Bending over aggravates.  She was questioning getting an injection.  Wants to hold at this time.  Discussed taking tylenol.  States she feels she is handling stress.  Does not feel needs any further intervention.  No chest pain.  Breathing stable.  Saw Dr Bary Castilla - DCIS.  Mammogram 01/10/19 - Birads I.  Blood pressures averaging 140-150s/60-70s - mostly.  Some higher.  Readings reviewed.  Discussed increasing amlodipine.  Blood sugars averaging 110-130.     ROS: See pertinent positives and negatives per HPI.  Past Medical History:  Diagnosis Date  . Allergic rhinitis   . Breast cancer ([place]) 2014   RT LUMPECTOMY  . 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 Resected margins negative. Whole breast radiation    . Cancer ([place]) 12-15-12   left upper arm, malignant melanoma Clark level II, Breslow thickness: 0.35 mm.  . Diabetes mellitus ([place])   . Hypercholesterolemia   . Hypertension   . Myasthenia gravis ([place])    ocular  . Personal history of radiation therapy 2014   Rt. Breast  . S/P radiation therapy 2015   BREAST CA    Past Surgical History:  Procedure Laterality Date  . BREAST BIOPSY Right 2014   DCIS  . BREAST LUMPECTOMY Right 2014   Lumpectomy  . BREAST SURGERY Right 2014   lumpectomy  . EYE SURGERY     Caterac both eyes    Family History  Problem Relation Age of Onset  . Skin cancer Father   . Cancer Father        colon  . Colon cancer Brother   . Cancer Brother        colon  . Breast cancer Other        niece  . Skin cancer Sister     SOCIAL HX: reviewed.    Current Outpatient Medications:  .  ACCU-CHEK SOFTCLIX LANCETS lancets, USE ONCE A DAY, Disp: 100 each, Rfl: 3 .  amLODipine (NORVASC) 10 MG tablet, Take 1 tablet (10 mg total) by mouth daily., Disp: 30 tablet, Rfl: 0 .  aspirin 81 MG tablet, Take 81 mg by mouth daily., Disp: , Rfl:  .  atorvastatin (LIPITOR) 20 MG tablet, TAKE 1 TABLET EVERY DAY, Disp: 90 tablet, Rfl: 2 .  Calcium Carbonate-Vitamin D (CALCIUM 600+D) 600-400 MG-UNIT per tablet, Take 1 tablet by mouth 2 (two) times daily., Disp: , Rfl:  .  CRANBERRY PO, Take by mouth daily., Disp: , Rfl:  .  glucose blood (ACCU-CHEK AVIVA PLUS) test strip, 1 each by Other route as needed for other. Use as instructed to check blood sugar once daily.DX E11.9, Disp: 100 each, Rfl: 3 .  guaiFENesin (MUCINEX) 600 MG 12 hr tablet, Take 1 tablet (600 mg total) by mouth 2 (two) times daily., Disp: 20 tablet, Rfl: 1 .  hydrochlorothiazide (HYDRODIURIL) 25 MG tablet, TAKE 1 TABLET EVERY DAY, Disp: 90 tablet, Rfl: 0 .  Multiple Vitamins-Minerals (ICAPS AREDS 2) CAPS, Take 1 capsule by mouth 2 (two) times daily., Disp: , Rfl:  .  Multiple Vitamins-Minerals (PRESERVISION  AREDS 2+MULTI VIT PO), Take by mouth., Disp: , Rfl:  .  potassium chloride (KLOR-CON) 10 MEQ tablet, TAKE 1 TABLET EVERY DAY, Disp: 90 tablet, Rfl: 0 .  ramipril (ALTACE) 10 MG capsule, TAKE 1 CAPSULE EVERY DAY, Disp: 90 capsule, Rfl: 1 .  tamoxifen (NOLVADEX) 20 MG tablet, TAKE 1 TABLET EVERY DAY (Patient not taking: Reported on 01/24/2019), Disp: 90 tablet, Rfl: 3 .  traZODone (DESYREL) 50 MG tablet, TAKE 1 1/2 - 2 TABLETS AT BEDTIME, Disp: 180 tablet, Rfl: 1  EXAM:  VITALS per patient if applicable: 233/43  GENERAL: alert.  Sounds to be in no acute distress. Answering questions appropriately.    PSYCH/NEURO: pleasant and cooperative, no obvious depression or anxiety, speech and thought processing grossly intact  ASSESSMENT AND PLAN:  Discussed the following assessment and plan:  Diabetes mellitus Sugars as outlined.  Appeared to be under good control.  Continues to control with diet.  Low carb diet and exercise.  Follow met b and a1c.    Hypercholesterolemia On lipitor.  Lo wcholesterol diet and exercise.  Follow lipid panel and liver function tests.    Hypertension Blood pressure remains elevated.  Increase amlodipine to 86m q day.  She will take 1 (561m tablets daily for now.  Will call when wants 1021mrescription sent in to mail order pharmacy.    Mild depression (HCCDarieniscussed with her today.  Feels handling things are relatively well.  Does not feel needs any further intervention.  Follow.    Myasthenia gravis No currently symptoms/flares.  Has been followed by Dr ShaManuella Ghazi   No orders of the defined types were placed in this encounter.   Meds ordered this encounter  Medications  . amLODipine (NORVASC) 10 MG tablet    Sig: Take 1 tablet (10 mg total) by mouth daily.    Dispense:  30 tablet    Refill:  0     I discussed the assessment and treatment plan with the patient. The patient was provided an opportunity to ask questions and all were answered. The patient  agreed with the plan and demonstrated an understanding of the instructions.   The patient was advised to call back or seek an in-person evaluation if the symptoms worsen or if the condition fails to improve as anticipated.  I provided 15 minutes of non-face-to-face time during this encounter.   ChaEinar PheasantD

## 2019-03-01 ENCOUNTER — Encounter: Payer: Self-pay | Admitting: Internal Medicine

## 2019-03-01 MED ORDER — AMLODIPINE BESYLATE 10 MG PO TABS: 10.0000 mg | ORAL_TABLET | Freq: Every day | ORAL | 0 refills | Status: DC

## 2019-03-01 NOTE — Assessment & Plan Note (Signed)
Blood pressure remains elevated.  Increase amlodipine to 10mg  q day.  She will take 1 (5mg ) tablets daily for now.  Will call when wants 10mg  prescription sent in to mail order pharmacy.

## 2019-03-01 NOTE — Assessment & Plan Note (Signed)
No currently symptoms/flares.  Has been followed by Dr Manuella Ghazi.

## 2019-03-01 NOTE — Assessment & Plan Note (Addendum)
Sugars as outlined.  Appeared to be under good control.  Continues to control with diet.  Low carb diet and exercise.  Follow met b and a1c.

## 2019-03-01 NOTE — Assessment & Plan Note (Signed)
On lipitor.  Low cholesterol diet and exercise.  Follow lipid panel and liver function tests.   

## 2019-03-01 NOTE — Telephone Encounter (Signed)
Discussed with patient at appt.  Medication adjusted.

## 2019-03-01 NOTE — Assessment & Plan Note (Signed)
Discussed with her today.  Feels handling things are relatively well.  Does not feel needs any further intervention.  Follow.

## 2019-03-22 ENCOUNTER — Other Ambulatory Visit: Payer: Self-pay | Admitting: Internal Medicine

## 2019-03-26 ENCOUNTER — Other Ambulatory Visit: Payer: Self-pay | Admitting: Internal Medicine

## 2019-03-30 ENCOUNTER — Other Ambulatory Visit: Payer: Self-pay | Admitting: Internal Medicine

## 2019-03-30 MED ORDER — AMLODIPINE BESYLATE 10 MG PO TABS: 10.0000 mg | ORAL_TABLET | Freq: Every day | ORAL | 1 refills | Status: DC

## 2019-03-30 NOTE — Telephone Encounter (Signed)
Reviewed blood pressures.  Blood pressure - overall ok - recent improved.  Will continue current medication.  Will sen in rx.

## 2019-03-30 NOTE — Progress Notes (Signed)
rx for 10mg  amlodipine sent to mail order pharamcy.

## 2019-03-30 NOTE — Telephone Encounter (Signed)
Pt said she only has 8 more pills of amlodipine left. She said she will be needing a refill sent to [facility] mail order. Pt said that Dr. Nicki Reaper has her taking 2 pills instead of 1 so her new prescription needs to say this so she don't run out.  Her BP readings:  Date     AM                 PM 1/25     129/60,73   142/64,68 1/26 139/62,69   145/71,66 1/27     140/62,73   141/67,72 1/28     119/52,67   147/65,69 1/29     144/62,74   147/67,69 1/30     156/65,73   147/69,87 1/31     137/59,78   142/70,72 2/1       [id number]   138/56,76 2/2       125/53,82   133/67,73 2/3       [id number]

## 2019-03-31 NOTE — Telephone Encounter (Signed)
Patient is aware 

## 2019-04-05 ENCOUNTER — Other Ambulatory Visit: Payer: Self-pay

## 2019-04-05 ENCOUNTER — Telehealth: Payer: Self-pay | Admitting: Internal Medicine

## 2019-04-05 MED ORDER — AMLODIPINE BESYLATE 10 MG PO TABS: 10.0000 mg | ORAL_TABLET | Freq: Every day | ORAL | 0 refills | Status: DC

## 2019-04-05 MED ORDER — AMLODIPINE BESYLATE 10 MG PO TABS: 10.0000 mg | ORAL_TABLET | Freq: Every day | ORAL | 1 refills | Status: DC

## 2019-04-05 NOTE — Telephone Encounter (Signed)
Pt states her pharmacy has not received the amlodipine prescription and she wants to talk to Dr. Nicki Reaper about this. She will be out of her medicine by tomorrow and needs to call [facility] and get 30 days at that pharmacy and then Dr. Nicki Reaper can send in the rest to [facility]. Please call the patient.

## 2019-04-05 NOTE — Telephone Encounter (Signed)
Spoke with patient and switched pharmacy to [facility]. Sent in 90 day supply per patient request.

## 2019-04-07 DIAGNOSIS — H353221 Exudative age-related macular degeneration, left eye, with active choroidal neovascularization: Secondary | ICD-10-CM | POA: Diagnosis not present

## 2019-04-11 NOTE — Telephone Encounter (Signed)
Pt called wanting to talk about the Amlodipine

## 2019-04-11 NOTE — Telephone Encounter (Signed)
Patient was just calling to let me know that she got her prescription from [facility] and would like to use [facility] from now on for this prescription

## 2019-04-12 ENCOUNTER — Other Ambulatory Visit: Payer: Self-pay

## 2019-04-12 MED ORDER — AMLODIPINE BESYLATE 10 MG PO TABS: 10.0000 mg | ORAL_TABLET | Freq: Every day | ORAL | 1 refills | Status: DC

## 2019-04-12 NOTE — Telephone Encounter (Signed)
[facility] is calling checking on request for amlodipine prescription for patient. [facility] could not transfer it over due to it not having any refills.

## 2019-04-12 NOTE — Telephone Encounter (Signed)
Rx sent to [facility] for future fills

## 2019-04-18 ENCOUNTER — Ambulatory Visit: Payer: Medicare Other | Admitting: Internal Medicine

## 2019-04-25 ENCOUNTER — Other Ambulatory Visit: Payer: Self-pay | Admitting: Internal Medicine

## 2019-04-25 ENCOUNTER — Other Ambulatory Visit: Payer: Self-pay

## 2019-04-25 ENCOUNTER — Ambulatory Visit (INDEPENDENT_AMBULATORY_CARE_PROVIDER_SITE_OTHER): Payer: Medicare Other | Admitting: Internal Medicine

## 2019-04-25 DIAGNOSIS — D0511 Intraductal carcinoma in situ of right breast: Secondary | ICD-10-CM | POA: Diagnosis not present

## 2019-04-25 DIAGNOSIS — I10 Essential (primary) hypertension: Secondary | ICD-10-CM | POA: Diagnosis not present

## 2019-04-25 DIAGNOSIS — G7000 Myasthenia gravis without (acute) exacerbation: Secondary | ICD-10-CM

## 2019-04-25 DIAGNOSIS — E7800 Pure hypercholesterolemia, unspecified: Secondary | ICD-10-CM

## 2019-04-25 DIAGNOSIS — F320 Major depressive disorder, single episode, mild: Secondary | ICD-10-CM | POA: Diagnosis not present

## 2019-04-25 DIAGNOSIS — E119 Type 2 diabetes mellitus without complications: Secondary | ICD-10-CM | POA: Diagnosis not present

## 2019-04-25 DIAGNOSIS — F32A Depression, unspecified: Secondary | ICD-10-CM

## 2019-04-25 NOTE — Progress Notes (Signed)
Patient ID: Cynthia [name], female   DOB: 1931/08/19, 86 y.o.   MRN: [id number]   Virtual Visit via telephone Note  This visit type was conducted due to national recommendations for restrictions regarding the COVID-19 pandemic (e.g. social distancing).  This format is felt to be most appropriate for this patient at this time.  All issues noted in this document were discussed and addressed.  No physical exam was performed (except for noted visual exam findings with Video Visits).   I connected with Cynthia [name] by telephone and verified that I am speaking with the correct person using two identifiers. Location patient: home Location provider: work Persons participating in the telephone visit: patient, provider  The limitations, risks, security and privacy concerns of performing an evaluation and management service by telephone and the availability of in person appointments have been discussed.  The patient expressed understanding and agreed to proceed.  Reason for visit:  Scheduled follow up.   HPI: States she is doing relatively well.  Last visit, increased amlodipine to 25m q day. Blood pressure had been elevated.  States blood pressures have mostly been averaging 1619Jsystolic.  No chest pain or sob.  No acid reflux.  No abdominal pain.  Some left hip pain - noticed when sitting.  Up moving - not as bad. Desires no further intervention.  Sugars up and down.  Overall appears to be under good control.  Sleeping ok.  Overall handling stress.     ROS: See pertinent positives and negatives per HPI.  Past Medical History:  Diagnosis Date  . Allergic rhinitis   . [place]   [place] LUM[place]OMY  . 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 Resected margins negative. Whole breast radiation  . Cancer ([place] 12[place]   left upper arm, malignant melanoma Clark level II, Breslow thickness: 0.35 mm.  . Diabetes mellitus ([place]   . Hypercholesterolemia   . Hypertension   .  [place]    ocular  . Personal history of radiation therapy 2014   Rt. Breast  . S/P radiation therapy 2015   BREAST CA    Past Surgical History:  Procedure Laterality Date  . BREAST BIOPSY Right 2014   DCIS  . BREAST LUMPECTOMY Right 2014   Lumpectomy  . BREAST SURGERY Right 2014   lumpectomy  . EYE SURGERY     Caterac both eyes    Family History  Problem Relation Age of Onset  . Skin cancer Father   . Cancer Father        colon  . Colon cancer Brother   . Cancer Brother        colon  . Breast cancer Other        niece  . Skin cancer Sister     SOCIAL HX: reviewed.    Current Outpatient Medications:  .  ACCU-CHEK AVIVA PLUS test strip, 1 EACH BY OTHER ROUTE AS NEEDED FOR OTHER. USE AS INSTRUCTED TO CHECK BLOOD SUGAR ONCE DAILY.DX E11.9, Disp: 100 strip, Rfl: 3 .  ACCU-CHEK SOFTCLIX LANCETS lancets, USE ONCE A DAY, Disp: 100 each, Rfl: 3 .  amLODipine (NORVASC) 10 MG tablet, Take 1 tablet (10 mg total) by mouth daily., Disp: 90 tablet, Rfl: 1 .  aspirin 81 MG tablet, Take 81 mg by mouth daily., Disp: , Rfl:  .  atorvastatin (LIPITOR) 20 MG tablet, TAKE 1 TABLET EVERY DAY, Disp: 90 tablet, Rfl: 2 .  Calcium  Carbonate-Vitamin D (CALCIUM 600+D) 600-400 MG-UNIT per tablet, Take 1 tablet by mouth 2 (two) times daily., Disp: , Rfl:  .  CRANBERRY PO, Take by mouth daily., Disp: , Rfl:  .  guaiFENesin (MUCINEX) 600 MG 12 hr tablet, Take 1 tablet (600 mg total) by mouth 2 (two) times daily., Disp: 20 tablet, Rfl: 1 .  hydrochlorothiazide (HYDRODIURIL) 25 MG tablet, TAKE 1 TABLET EVERY DAY, Disp: 90 tablet, Rfl: 0 .  Multiple Vitamins-Minerals (ICAPS AREDS 2) CAPS, Take 1 capsule by mouth 2 (two) times daily., Disp: , Rfl:  .  Multiple Vitamins-Minerals (PRESERVISION AREDS 2+MULTI VIT PO), Take by mouth., Disp: , Rfl:  .  potassium chloride (KLOR-CON) 10 MEQ tablet, TAKE 1 TABLET EVERY DAY, Disp: 90 tablet, Rfl: 0 .  ramipril (ALTACE) 10 MG capsule, TAKE 1  CAPSULE EVERY DAY, Disp: 90 capsule, Rfl: 1 .  tamoxifen (NOLVADEX) 20 MG tablet, TAKE 1 TABLET EVERY DAY (Patient not taking: Reported on 01/24/2019), Disp: 90 tablet, Rfl: 3 .  traZODone (DESYREL) 50 MG tablet, TAKE 1 1/2 - 2 TABLETS AT BEDTIME, Disp: 180 tablet, Rfl: 1  EXAM:  GENERAL: alert, oriented, appears well and in no acute distress  HEENT: atraumatic, conjunttiva clear, no obvious abnormalities on inspection of external nose and ears  NECK: normal movements of the head and neck  LUNGS: on inspection no signs of respiratory distress, breathing rate appears normal, no obvious gross SOB, gasping or wheezing  CV: no obvious cyanosis  PSYCH/NEURO: pleasant and cooperative, no obvious depression or anxiety, speech and thought processing grossly intact  ASSESSMENT AND PLAN:  Discussed the following assessment and plan:  DCIS (ductal carcinoma in situ) of breast Has been followed by Dr Bary Castilla.  Evaluated 12/2018 - Birads I.   Diabetes mellitus Low carb diet and exercise.  Sugars as outlined.  Appear to be under better control.  Follow met b and a1c.   Hypercholesterolemia On lipitor.  Low cholesterol diet and exercise.  Follow lipid panel and liver function tests.    Hypertension On amlodipine 97m q day now.  Continues on ramipril and hctz.  Blood pressures appear to be doing better.  Continue current medication regimen.  Follow pressures.  Follow metabolic panel.   Mild depression (HW[place] Discussed with her today.  She feels she is handling things relatively well.  Does not feel needs any further intervention.  Follow.    Myasthenia gravis Has been followed by Dr SManuella Ghazi  Stable.  No recent flares.    Orders Placed This Encounter  Procedures  . CBC with Differential/Platelet    Standing Status:   Future    Standing Expiration Date:   04/30/2020  . Hemoglobin A1c    Standing Status:   Future    Standing Expiration Date:   04/30/2020  . Hepatic function panel    Standing  Status:   Future    Standing Expiration Date:   04/30/2020  . Lipid panel    Standing Status:   Future    Standing Expiration Date:   04/30/2020  . TSH    Standing Status:   Future    Standing Expiration Date:   04/30/2020  . Basic metabolic panel    Standing Status:   Future    Standing Expiration Date:   04/30/2020  . Microalbumin / creatinine urine ratio    Standing Status:   Future    Standing Expiration Date:   04/30/2020    I discussed the assessment and treatment plan with  the patient. The patient was provided an opportunity to ask questions and all were answered. The patient agreed with the plan and demonstrated an understanding of the instructions.   The patient was advised to call back or seek an in-person evaluation if the symptoms worsen or if the condition fails to improve as anticipated.  I provided 22 minutes of non-face-to-face time during this encounter.   Einar Pheasant, MD

## 2019-05-01 ENCOUNTER — Encounter: Payer: Self-pay | Admitting: Internal Medicine

## 2019-05-01 NOTE — Assessment & Plan Note (Signed)
Discussed with her today.  She feels she is handling things relatively well.  Does not feel needs any further intervention.  Follow.   

## 2019-05-01 NOTE — Assessment & Plan Note (Signed)
On amlodipine 10mg  q day now.  Continues on ramipril and hctz.  Blood pressures appear to be doing better.  Continue current medication regimen.  Follow pressures.  Follow metabolic panel.

## 2019-05-01 NOTE — Assessment & Plan Note (Signed)
Has been followed by Dr Manuella Ghazi.  Stable.  No recent flares.

## 2019-05-01 NOTE — Assessment & Plan Note (Signed)
Has been followed by Dr Bary Castilla.  Evaluated 12/2018 - Birads I.

## 2019-05-01 NOTE — Assessment & Plan Note (Signed)
Low carb diet and exercise.  Sugars as outlined.  Appear to be under better control.  Follow met b and a1c.

## 2019-05-01 NOTE — Assessment & Plan Note (Signed)
On lipitor.  Low cholesterol diet and exercise.  Follow lipid panel and liver function tests.   

## 2019-05-25 DIAGNOSIS — H353221 Exudative age-related macular degeneration, left eye, with active choroidal neovascularization: Secondary | ICD-10-CM | POA: Diagnosis not present

## 2019-06-17 ENCOUNTER — Other Ambulatory Visit: Payer: Self-pay | Admitting: Internal Medicine

## 2019-06-29 ENCOUNTER — Other Ambulatory Visit: Payer: Self-pay | Admitting: Internal Medicine

## 2019-06-29 NOTE — Telephone Encounter (Signed)
rx ok's for kcl #90 with no refills.

## 2019-06-29 NOTE — Telephone Encounter (Signed)
Refill request for potassium, last seen 04-25-19, last filled 03-28-19.  Please advise. Last Potassium check was: 01-18-19 Potassium result was: 3.9

## 2019-07-04 ENCOUNTER — Ambulatory Visit (INDEPENDENT_AMBULATORY_CARE_PROVIDER_SITE_OTHER): Payer: Medicare Other

## 2019-07-04 ENCOUNTER — Encounter (INDEPENDENT_AMBULATORY_CARE_PROVIDER_SITE_OTHER): Payer: Self-pay

## 2019-07-04 VITALS — BP 127/55 | HR 75 | Ht 63.0 in | Wt 141.0 lb

## 2019-07-04 DIAGNOSIS — Z0000 Encounter for general adult medical examination without abnormal findings: Secondary | ICD-10-CM | POA: Diagnosis not present

## 2019-07-04 NOTE — Patient Instructions (Addendum)
  [name] , Thank you for taking time to come for your Medicare Wellness Visit. I appreciate your ongoing commitment to your health goals. Please review the following plan we discussed and let me know if I can assist you in the future.   These are the goals we discussed: Goals      Patient Stated   . DIET - REDUCE SUGAR INTAKE (pt-stated)     Monitor diet       This is a list of the screening recommended for you and due dates:  Health Maintenance  Topic Date Due  . Tetanus Vaccine  Never done  . Pneumonia vaccines (2 of 2 - PPSV23) 11/24/2014  . Complete foot exam   01/13/2019  . Hemoglobin A1C  07/18/2019  . Flu Shot  09/25/2019  . Eye exam for diabetics  12/08/2019  . Mammogram  01/10/2020  . DEXA scan (bone density measurement)  Completed  . COVID-19 Vaccine  Completed

## 2019-07-04 NOTE — Progress Notes (Addendum)
Subjective:   Cynthia [name] is a 86 y.o. female who presents for Medicare Annual (Subsequent) preventive examination.  Review of Systems:  No ROS.  Medicare Wellness Virtual Visit.  Visual/audio telehealth visit. Vital signs provided by patient.  See social history for additional risk factors.   Cardiac Risk Factors include: advanced age (>29men, >56 women);diabetes mellitus;hypertension     Objective:     Vitals: BP (!) 127/55   Pulse 75   Ht 5\' 3"  (1.6 m)   Wt 141 lb (64 kg)   BMI 24.98 kg/m   Body mass index is 24.98 kg/m.  Advanced Directives 07/04/2019 07/01/2018 06/23/2017 05/20/2016  Does Patient Have a Medical Advance Directive? Yes Yes Yes Yes  Type of Paramedic of [place];Living will Living will [place];Living will Living will;Healthcare Power of Attorney  Does patient want to make changes to medical advance directive? No - Patient declined No - Patient declined No - Patient declined No - Patient declined  Copy of [place] in Chart? No - copy requested - No - copy requested No - copy requested    Tobacco Social History   Tobacco Use  Smoking Status Never Smoker  Smokeless Tobacco Never Used     Counseling given: Not Answered   Clinical Intake:  Pre-visit preparation completed: Yes        Diabetes: Yes(Followed by pcp)  How often do you need to have someone help you when you read instructions, pamphlets, or other written materials from your doctor or pharmacy?: 1 - Never  Interpreter Needed?: No     Past Medical History:  Diagnosis Date  . Allergic rhinitis   . Breast cancer ([place]) 2014   RT LUMPECTOMY  . 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 Resected margins negative. Whole breast radiation  . Cancer ([place]) 12-15-12   left upper arm, malignant melanoma Clark level II, Breslow thickness: 0.35 mm.  . Diabetes mellitus ([place])   . Hypercholesterolemia   .  Hypertension   . Myasthenia gravis ([place])    ocular  . Personal history of radiation therapy 2014   Rt. Breast  . S/P radiation therapy 2015   BREAST CA   Past Surgical History:  Procedure Laterality Date  . BREAST BIOPSY Right 2014   DCIS  . BREAST LUMPECTOMY Right 2014   Lumpectomy  . BREAST SURGERY Right 2014   lumpectomy  . EYE SURGERY     Caterac both eyes   Family History  Problem Relation Age of Onset  . Skin cancer Father   . Cancer Father        colon  . Colon cancer Brother   . Cancer Brother        colon  . Breast cancer Other        niece  . Skin cancer Sister    Social History   Socioeconomic History  . Marital status: Divorced    Spouse name: Not on file  . Number of children: Not on file  . Years of education: Not on file  . Highest education level: Not on file  Occupational History  . Not on file  Tobacco Use  . Smoking status: Never Smoker  . Smokeless tobacco: Never Used  Substance and Sexual Activity  . Alcohol use: No    Alcohol/week: 0.0 standard drinks  . Drug use: No  . Sexual activity: Never  Other Topics Concern  . Not  on file  Social History Narrative  . Not on file   Social Determinants of Health   Financial Resource Strain:   . Difficulty of Paying Living Expenses:   Food Insecurity:   . Worried About Charity fundraiser in the Last Year:   . Arboriculturist in the Last Year:   Transportation Needs:   . Film/video editor (Medical):   Marland Kitchen Lack of Transportation (Non-Medical):   Physical Activity:   . Days of Exercise per Week:   . Minutes of Exercise per Session:   Stress:   . Feeling of Stress :   Social Connections:   . Frequency of Communication with Friends and Family:   . Frequency of Social Gatherings with Friends and Family:   . Attends Religious Services:   . Active Member of Clubs or Organizations:   . Attends Archivist Meetings:   Marland Kitchen Marital Status:     Outpatient Encounter Medications as of  07/04/2019  Medication Sig  . ACCU-CHEK AVIVA PLUS test strip 1 EACH BY OTHER ROUTE AS NEEDED FOR OTHER. USE AS INSTRUCTED TO CHECK BLOOD SUGAR ONCE DAILY.DX E11.9  . ACCU-CHEK SOFTCLIX LANCETS lancets USE ONCE A DAY  . amLODipine (NORVASC) 10 MG tablet Take 1 tablet (10 mg total) by mouth daily.  Marland Kitchen aspirin 81 MG tablet Take 81 mg by mouth daily.  Marland Kitchen atorvastatin (LIPITOR) 20 MG tablet TAKE 1 TABLET EVERY DAY  . Calcium Carbonate-Vitamin D (CALCIUM 600+D) 600-400 MG-UNIT per tablet Take 1 tablet by mouth 2 (two) times daily.  Marland Kitchen CRANBERRY PO Take by mouth daily.  Marland Kitchen guaiFENesin (MUCINEX) 600 MG 12 hr tablet Take 1 tablet (600 mg total) by mouth 2 (two) times daily.  . hydrochlorothiazide (HYDRODIURIL) 25 MG tablet TAKE 1 TABLET EVERY DAY  . Multiple Vitamins-Minerals (ICAPS AREDS 2) CAPS Take 1 capsule by mouth 2 (two) times daily.  . Multiple Vitamins-Minerals (PRESERVISION AREDS 2+MULTI VIT PO) Take by mouth.  . potassium chloride (KLOR-CON) 10 MEQ tablet TAKE 1 TABLET EVERY DAY  . ramipril (ALTACE) 10 MG capsule TAKE 1 CAPSULE EVERY DAY  . tamoxifen (NOLVADEX) 20 MG tablet TAKE 1 TABLET EVERY DAY (Patient not taking: Reported on 01/24/2019)  . traZODone (DESYREL) 50 MG tablet TAKE 1 1/2 - 2 TABLETS AT BEDTIME   No facility-administered encounter medications on file as of 07/04/2019.    Activities of Daily Living In your present state of health, do you have any difficulty performing the following activities: 07/04/2019  Hearing? N  Vision? N  Difficulty concentrating or making decisions? N  Walking or climbing stairs? N  Dressing or bathing? N  Doing errands, shopping? N  Preparing Food and eating ? N  Using the Toilet? N  In the past six months, have you accidently leaked urine? N  Do you have problems with loss of bowel control? N  Managing your Medications? N  Managing your Finances? N  Housekeeping or managing your Housekeeping? N  Some recent data might be hidden    Patient  Care Team: Einar Pheasant, MD as PCP - General (Internal Medicine) Bary Castilla, Forest Gleason, MD (General Surgery) Einar Pheasant, MD (Internal Medicine) Barrie Dunker, MD (Dermatology)    Assessment:   This is a routine wellness examination for [place].  Nurse connected with patient 07/04/19 at  1:30 PM EDT by a telephone enabled telemedicine application and verified that I am speaking with the correct person using two identifiers. Patient stated full name and  DOB. Patient gave permission to continue with virtual visit. Patient's location was at home and Nurse's location was at [place] office.   Patient is alert and oriented x3. Patient denies difficulty focusing or concentrating. Patient likes to read her Bible for brain health.   Health Maintenance Due: -PNA and Tdap vaccine- discussed; to be completed with doctor in visit or local pharmacy. Deferred per patient request.  -Hgb A1c- 01/18/19 (7.2) See completed HM at the end of note.   Eye: Visual acuity not assessed. Virtual visit. Followed by their ophthalmologist. Macular degeneration; injections every 8 weeks.   Dental: Visits every 6 months.    Hearing: Demonstrates normal hearing during visit.  Safety:  Patient feels safe at home- yes Patient does have smoke detectors at home- yes Patient does wear sunscreen or protective clothing when in direct sunlight - yes Patient does wear seat belt when in a moving vehicle - yes Patient drives- yes Adequate lighting in walkways free from debris- yes Grab bars and handrails used as appropriate- yes Ambulates with an assistive device- no Cell phone on person when ambulating outside of the home- yes  Social: Alcohol intake - no  Smoking history- never   Smokers in home? none Illicit drug use? none  Medication: Taking as directed and without issues.  Self managed - yes   Covid-19: Precautions and sickness symptoms discussed. Wears mask, social distancing, hand hygiene  as appropriate.   Activities of Daily Living Patient denies needing assistance with: household chores, feeding themselves, getting from bed to chair, getting to the toilet, bathing/showering, dressing, managing money, or preparing meals.   Discussed the importance of a healthy diet, water intake and the benefits of aerobic exercise.  Physical activity- active around the home, no routine.  Diet:  Modified carb; she tries to eat a healthy diet Water: fair intake  Other Providers Patient Care Team: Einar Pheasant, MD as PCP - General (Internal Medicine) Bary Castilla, Forest Gleason, MD (General Surgery) Einar Pheasant, MD (Internal Medicine) Barrie Dunker, MD (Dermatology)  Exercise Activities and Dietary recommendations    Goals      Patient Stated   . DIET - REDUCE SUGAR INTAKE (pt-stated)     Monitor diet       Fall Risk Fall Risk  07/04/2019 01/24/2019 07/01/2018 01/26/2018 06/23/2017  Falls in the past year? 0 0 0 0 No  Number falls in past yr: - 0 - - -  Injury with Fall? - - - - -  Risk for fall due to : - - - - -  Follow up Falls evaluation completed Falls evaluation completed - Falls evaluation completed -   Timed Get Up and Go performed: no, virtual visit  Depression Screen PHQ 2/9 Scores 07/04/2019 02/28/2019 01/24/2019 09/30/2018  PHQ - 2 Score 1 0 0 0  PHQ- 9 Score - 0 - 2     Cognitive Function     6CIT Screen 07/04/2019 07/01/2018 06/23/2017 05/20/2016  What Year? - 0 points 0 points 0 points  What month? - 0 points 0 points 0 points  What time? - 0 points 0 points 0 points  Count back from 20 - 0 points 0 points 0 points  Months in reverse 0 points 0 points 0 points 0 points  Repeat phrase - 0 points 0 points 0 points  Total Score - 0 0 0    Immunization History  Administered Date(s) Administered  . Influenza Split 12/20/2011  . Influenza, High Dose Seasonal PF 12/15/2016, 01/12/2018,  12/25/2018  . Influenza,inj,Quad PF,6+ Mos 11/08/2012, 11/23/2013,  12/14/2014  . Influenza-Unspecified 12/14/2014, 12/10/2015  . PFIZER SARS-COV-2 Vaccination 04/24/2019, 05/16/2019  . Pneumococcal Conjugate-13 11/23/2013   Screening Tests Health Maintenance  Topic Date Due  . TETANUS/TDAP  Never done  . PNA vac Low Risk Adult (2 of 2 - PPSV23) 11/24/2014  . FOOT EXAM  01/13/2019  . HEMOGLOBIN A1C  07/18/2019  . INFLUENZA VACCINE  09/25/2019  . OPHTHALMOLOGY EXAM  12/08/2019  . MAMMOGRAM  01/10/2020  . DEXA SCAN  Completed  . COVID-19 Vaccine  Completed      Plan:   Keep all routine maintenance appointments.   Next scheduled lab 07/05/19. Patient requests urine with labs; notes possible UTI with symptom of urinary frequency and discomfort with urination. Deferred to pcp.  Follow up 08/02/19 @ 2:00  Medicare Attestation I have personally reviewed: The patient's medical and social history Their use of alcohol, tobacco or illicit drugs Their current medications and supplements The patient's functional ability including ADLs,fall risks, home safety risks, cognitive, and hearing and visual impairment Diet and physical activities Evidence for depression   I have reviewed and discussed with patient certain preventive protocols, quality metrics, and best practice recommendations.     Varney Biles, LPN  [id number]   Reviewed above information.  See phone message. appt to evaluate for possible UTI.  Agree with assessment and plan.    Dr Nicki Reaper

## 2019-07-05 ENCOUNTER — Other Ambulatory Visit: Payer: Medicare Other

## 2019-07-06 ENCOUNTER — Ambulatory Visit (INDEPENDENT_AMBULATORY_CARE_PROVIDER_SITE_OTHER): Payer: Medicare Other | Admitting: Internal Medicine

## 2019-07-06 ENCOUNTER — Encounter: Payer: Self-pay | Admitting: Internal Medicine

## 2019-07-06 ENCOUNTER — Other Ambulatory Visit: Payer: Self-pay

## 2019-07-06 VITALS — BP 136/72 | HR 66 | Temp 96.0°F | Resp 16 | Ht 63.0 in | Wt 143.0 lb

## 2019-07-06 DIAGNOSIS — E119 Type 2 diabetes mellitus without complications: Secondary | ICD-10-CM | POA: Diagnosis not present

## 2019-07-06 DIAGNOSIS — R300 Dysuria: Secondary | ICD-10-CM | POA: Diagnosis not present

## 2019-07-06 DIAGNOSIS — E7800 Pure hypercholesterolemia, unspecified: Secondary | ICD-10-CM | POA: Diagnosis not present

## 2019-07-06 DIAGNOSIS — I10 Essential (primary) hypertension: Secondary | ICD-10-CM | POA: Diagnosis not present

## 2019-07-06 LAB — BASIC METABOLIC PANEL
BUN: 14 mg/dL (ref 6–23)
CO2: 30 mEq/L (ref 19–32)
Calcium: 9.9 mg/dL (ref 8.4–10.5)
Chloride: 99 mEq/L (ref 96–112)
Creatinine, Ser: 0.9 mg/dL (ref 0.40–1.20)
GFR: 59.41 mL/min — ABNORMAL LOW (ref >60.00)
Glucose, Bld: 129 mg/dL — ABNORMAL HIGH (ref 70–99)
Potassium: 3.6 mEq/L (ref 3.5–5.1)
Sodium: 137 mEq/L (ref 135–145)

## 2019-07-06 LAB — POCT URINALYSIS DIPSTICK
Bilirubin, UA: NEGATIVE
Blood, UA: NEGATIVE
Glucose, UA: NEGATIVE
Nitrite, UA: NEGATIVE
Protein, UA: POSITIVE — AB
Spec Grav, UA: 1.02 (ref 1.010–1.025)
Urobilinogen, UA: 0.2 E.U./dL
pH, UA: 6.5 (ref 5.0–8.0)

## 2019-07-06 LAB — MICROALBUMIN / CREATININE URINE RATIO
Creatinine,U: 215.3 mg/dL
Microalb Creat Ratio: 1.1 mg/g (ref 0.0–30.0)
Microalb, Ur: 2.4 mg/dL — ABNORMAL HIGH (ref 0.0–1.9)

## 2019-07-06 LAB — URINALYSIS, MICROSCOPIC ONLY: RBC / HPF: NONE SEEN (ref 0–?)

## 2019-07-06 LAB — CBC WITH DIFFERENTIAL/PLATELET
Basophils Absolute: 0 10*3/uL (ref 0.0–0.1)
Basophils Relative: 0.6 % (ref 0.0–3.0)
Eosinophils Absolute: 0.3 10*3/uL (ref 0.0–0.7)
Eosinophils Relative: 3.9 % (ref 0.0–5.0)
HCT: 37.8 % (ref 36.0–46.0)
Hemoglobin: 12.5 g/dL (ref 12.0–15.0)
Lymphocytes Relative: 29.4 % (ref 12.0–46.0)
Lymphs Abs: 2 10*3/uL (ref 0.7–4.0)
MCHC: 33.2 g/dL (ref 30.0–36.0)
MCV: 84.6 fl (ref 78.0–100.0)
Monocytes Absolute: 0.5 10*3/uL (ref 0.1–1.0)
Monocytes Relative: 8 % (ref 3.0–12.0)
Neutro Abs: 3.9 10*3/uL (ref 1.4–7.7)
Neutrophils Relative %: 58.1 % (ref 43.0–77.0)
Platelets: 233 10*3/uL (ref 150.0–400.0)
RBC: 4.46 Mil/uL (ref 3.87–5.11)
RDW: 14.5 % (ref 11.5–15.5)
WBC: 6.7 10*3/uL (ref 4.0–10.5)

## 2019-07-06 LAB — HEPATIC FUNCTION PANEL
ALT: 13 U/L (ref 0–35)
AST: 18 U/L (ref 0–37)
Albumin: 3.9 g/dL (ref 3.5–5.2)
Alkaline Phosphatase: 97 U/L (ref 39–117)
Bilirubin, Direct: 0.1 mg/dL (ref 0.0–0.3)
Total Bilirubin: 0.5 mg/dL (ref 0.2–1.2)
Total Protein: 7 g/dL (ref 6.0–8.3)

## 2019-07-06 LAB — LIPID PANEL
Cholesterol: 154 mg/dL (ref 0–200)
HDL: 48.2 mg/dL (ref >39.00)
LDL Cholesterol: 84 mg/dL (ref 0–99)
NonHDL: 106
Total CHOL/HDL Ratio: 3
Triglycerides: 110 mg/dL (ref 0.0–149.0)
VLDL: 22 mg/dL (ref 0.0–40.0)

## 2019-07-06 LAB — POCT CBG (FASTING - GLUCOSE)-MANUAL ENTRY: Glucose Fasting, POC: 109 mg/dL — AB (ref 70–99)

## 2019-07-06 LAB — HEMOGLOBIN A1C: Hgb A1c MFr Bld: 7.6 % — ABNORMAL HIGH (ref 4.6–6.5)

## 2019-07-06 LAB — TSH: TSH: 1.07 u[IU]/mL (ref 0.35–4.50)

## 2019-07-06 MED ORDER — CEPHALEXIN 500 MG PO CAPS: 500.0000 mg | ORAL_CAPSULE | Freq: Two times a day (BID) | ORAL | 0 refills | Status: DC

## 2019-07-06 NOTE — Progress Notes (Signed)
Patient ID: Cynthia [name], female   DOB: Apr 08, 1931, 86 y.o.   MRN: [id number]   Subjective:    Patient ID: Cynthia [name], female    DOB: 08/07/1931, 86 y.o.   MRN: [id number]  HPI This visit occurred during the SARS-CoV-2 public health emergency.  Safety protocols were in place, including screening questions prior to the visit, additional usage of staff PPE, and extensive cleaning of exam room while observing appropriate contact time as indicated for disinfecting solutions.  Patient here for a work in appt with concerns regarding possible urinary tract infection. She reports symptoms started two weeks ago.  Has been intermittent, but persistent.  Reports dysuria and nocturia.  No fever.  Eating.  No nausea or vomiting.  No abdominal pain or back pain associated with start of symptoms.  No vaginal symptoms.     Past Medical History:  Diagnosis Date  . Allergic rhinitis   . Breast cancer ([place]) 2014   RT LUMPECTOMY  . 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 Resected margins negative. Whole breast radiation  . Cancer ([place]) 12-15-12   left upper arm, malignant melanoma Clark level II, Breslow thickness: 0.35 mm.  . Diabetes mellitus ([place])   . Hypercholesterolemia   . Hypertension   . Myasthenia gravis ([place])    ocular  . Personal history of radiation therapy 2014   Rt. Breast  . S/P radiation therapy 2015   BREAST CA   Past Surgical History:  Procedure Laterality Date  . BREAST BIOPSY Right 2014   DCIS  . BREAST LUMPECTOMY Right 2014   Lumpectomy  . BREAST SURGERY Right 2014   lumpectomy  . EYE SURGERY     Caterac both eyes   Family History  Problem Relation Age of Onset  . Skin cancer Father   . Cancer Father        colon  . Colon cancer Brother   . Cancer Brother        colon  . Breast cancer Other        niece  . Skin cancer Sister    Social History   Socioeconomic History  . Marital status: Divorced    Spouse name: Not on file  . Number  of children: Not on file  . Years of education: Not on file  . Highest education level: Not on file  Occupational History  . Not on file  Tobacco Use  . Smoking status: Never Smoker  . Smokeless tobacco: Never Used  Substance and Sexual Activity  . Alcohol use: No    Alcohol/week: 0.0 standard drinks  . Drug use: No  . Sexual activity: Never  Other Topics Concern  . Not on file  Social History Narrative  . Not on file   Social Determinants of Health   Financial Resource Strain:   . Difficulty of Paying Living Expenses:   Food Insecurity:   . Worried About Charity fundraiser in the Last Year:   . Arboriculturist in the Last Year:   Transportation Needs:   . Film/video editor (Medical):   Marland Kitchen Lack of Transportation (Non-Medical):   Physical Activity:   . Days of Exercise per Week:   . Minutes of Exercise per Session:   Stress:   . Feeling of Stress :   Social Connections:   . Frequency of Communication with Friends and Family:   . Frequency of Social Gatherings with Friends and Family:   .  Attends Religious Services:   . Active Member of Clubs or Organizations:   . Attends Archivist Meetings:   Marland Kitchen Marital Status:     Outpatient Encounter Medications as of 07/06/2019  Medication Sig  . ACCU-CHEK AVIVA PLUS test strip 1 EACH BY OTHER ROUTE AS NEEDED FOR OTHER. USE AS INSTRUCTED TO CHECK BLOOD SUGAR ONCE DAILY.DX E11.9  . ACCU-CHEK SOFTCLIX LANCETS lancets USE ONCE A DAY  . amLODipine (NORVASC) 10 MG tablet Take 1 tablet (10 mg total) by mouth daily.  Marland Kitchen aspirin 81 MG tablet Take 81 mg by mouth daily.  Marland Kitchen atorvastatin (LIPITOR) 20 MG tablet TAKE 1 TABLET EVERY DAY  . Calcium Carbonate-Vitamin D (CALCIUM 600+D) 600-400 MG-UNIT per tablet Take 1 tablet by mouth 2 (two) times daily.  . cephALEXin (KEFLEX) 500 MG capsule Take 1 capsule (500 mg total) by mouth 2 (two) times daily.  Marland Kitchen CRANBERRY PO Take by mouth daily.  Marland Kitchen guaiFENesin (MUCINEX) 600 MG 12 hr tablet  Take 1 tablet (600 mg total) by mouth 2 (two) times daily.  . hydrochlorothiazide (HYDRODIURIL) 25 MG tablet TAKE 1 TABLET EVERY DAY  . Multiple Vitamins-Minerals (ICAPS AREDS 2) CAPS Take 1 capsule by mouth 2 (two) times daily.  . Multiple Vitamins-Minerals (PRESERVISION AREDS 2+MULTI VIT PO) Take by mouth.  . potassium chloride (KLOR-CON) 10 MEQ tablet TAKE 1 TABLET EVERY DAY  . ramipril (ALTACE) 10 MG capsule TAKE 1 CAPSULE EVERY DAY  . tamoxifen (NOLVADEX) 20 MG tablet TAKE 1 TABLET EVERY DAY (Patient not taking: Reported on 01/24/2019)  . traZODone (DESYREL) 50 MG tablet TAKE 1 1/2 - 2 TABLETS AT BEDTIME   No facility-administered encounter medications on file as of 07/06/2019.    Review of Systems  Constitutional: Negative for appetite change and unexpected weight change.  HENT: Negative for congestion and sinus pressure.   Respiratory: Negative for cough and shortness of breath.   Cardiovascular: Negative for chest pain and leg swelling.  Gastrointestinal: Negative for abdominal pain, diarrhea, nausea and vomiting.  Genitourinary: Positive for dysuria. Negative for vaginal discharge.       Nocturia.   Musculoskeletal: Negative for back pain and myalgias.  Skin: Negative for color change and rash.  Neurological: Negative for dizziness and headaches.  Psychiatric/Behavioral: Negative for agitation and dysphoric mood.       Objective:    Physical Exam Vitals reviewed.  Constitutional:      General: She is not in acute distress.    Appearance: Normal appearance.  HENT:     Head: Normocephalic and atraumatic.  Neck:     Thyroid: No thyromegaly.  Cardiovascular:     Rate and Rhythm: Normal rate and regular rhythm.  Pulmonary:     Effort: No respiratory distress.     Breath sounds: Normal breath sounds. No wheezing.  Abdominal:     General: Bowel sounds are normal.     Palpations: Abdomen is soft.     Tenderness: There is no abdominal tenderness.  Musculoskeletal:         General: No swelling or tenderness.     Comments: No CVA tenderness.    Neurological:     Mental Status: She is alert.  Psychiatric:        Mood and Affect: Mood normal.        Behavior: Behavior normal.     BP 136/72   Pulse 66   Temp (!) 96 F (35.6 C)   Resp 16   Ht '5\' 3"'  (1.6  m)   Wt 143 lb (64.9 kg)   SpO2 97%   BMI 25.33 kg/m  Wt Readings from Last 3 Encounters:  07/06/19 143 lb (64.9 kg)  07/04/19 141 lb (64 kg)  04/25/19 141 lb (64 kg)     Lab Results  Component Value Date   WBC 6.7 07/06/2019   HGB 12.5 07/06/2019   HCT 37.8 07/06/2019   PLT 233.0 07/06/2019   GLUCOSE 129 (H) 07/06/2019   CHOL 154 07/06/2019   TRIG 110.0 07/06/2019   HDL 48.20 07/06/2019   LDLCALC 84 07/06/2019   ALT 13 07/06/2019   AST 18 07/06/2019   NA 137 07/06/2019   K 3.6 07/06/2019   CL 99 07/06/2019   CREATININE 0.90 07/06/2019   BUN 14 07/06/2019   CO2 30 07/06/2019   TSH 1.07 07/06/2019   HGBA1C 7.6 (H) 07/06/2019   MICROALBUR 2.4 (H) 07/06/2019    MM 3D SCREEN BREAST BILATERAL  Result Date: 01/10/2019 CLINICAL DATA:  Screening. EXAM: DIGITAL SCREENING BILATERAL MAMMOGRAM WITH TOMO AND CAD COMPARISON:  Previous exam(s). ACR Breast Density Category c: The breast tissue is heterogeneously dense, which may obscure small masses. FINDINGS: There are no findings suspicious for malignancy. Images were processed with CAD. IMPRESSION: No mammographic evidence of malignancy. A result letter of this screening mammogram will be mailed directly to the patient. RECOMMENDATION: Screening mammogram in one year. (Code:SM-B-01Y) BI-RADS CATEGORY  1: Negative. Electronically Signed   By: Lajean Manes M.D.   On: 01/10/2019 13:33       Assessment & Plan:   Problem List Items Addressed This Visit    Diabetes mellitus ([place])    Tries to watch her diet.  Check met b and a1c today.        Relevant Orders   POCT CBG (Fasting - Glucose) (Completed)   Dysuria - Primary    Has noticed  burning with urination.  Reports no vaginal symptoms.  Symptoms present for two weeks.  Trace leukocytes on urine dip.  Will send for culture.  Treat with keflex while awaiting culture - given persistence of symptoms.  Pt comfortable with this plan.        Relevant Orders   POCT urinalysis dipstick (Completed)   Urine Microscopic (Completed)   Urine Culture (Completed)   Hypercholesterolemia    On lipitor.  Follow lipid panel and liver function tests.        Hypertension    Blood pressure as outlined.  Continue hctz, amlodipine and ramipril.  Check metabolic panel today.           Einar Pheasant, MD

## 2019-07-06 NOTE — Patient Instructions (Signed)
Take align - one per day while on the antibiotic and for two weeks after completing the antibiotic.

## 2019-07-07 LAB — URINE CULTURE
MICRO NUMBER:: 10469165
SPECIMEN QUALITY:: ADEQUATE

## 2019-07-11 ENCOUNTER — Encounter: Payer: Self-pay | Admitting: Internal Medicine

## 2019-07-11 DIAGNOSIS — R300 Dysuria: Secondary | ICD-10-CM | POA: Insufficient documentation

## 2019-07-11 NOTE — Assessment & Plan Note (Signed)
Has noticed burning with urination.  Reports no vaginal symptoms.  Symptoms present for two weeks.  Trace leukocytes on urine dip.  Will send for culture.  Treat with keflex while awaiting culture - given persistence of symptoms.  Pt comfortable with this plan.

## 2019-07-11 NOTE — Assessment & Plan Note (Signed)
On lipitor.  Follow lipid panel and liver function tests.   

## 2019-07-11 NOTE — Assessment & Plan Note (Signed)
Blood pressure as outlined.  Continue hctz, amlodipine and ramipril.  Check metabolic panel today.

## 2019-07-11 NOTE — Assessment & Plan Note (Signed)
Tries to watch her diet.  Check met b and a1c today.

## 2019-07-21 DIAGNOSIS — H353221 Exudative age-related macular degeneration, left eye, with active choroidal neovascularization: Secondary | ICD-10-CM | POA: Diagnosis not present

## 2019-07-25 ENCOUNTER — Other Ambulatory Visit: Payer: Self-pay | Admitting: Internal Medicine

## 2019-08-02 ENCOUNTER — Other Ambulatory Visit: Payer: Self-pay

## 2019-08-02 ENCOUNTER — Ambulatory Visit (INDEPENDENT_AMBULATORY_CARE_PROVIDER_SITE_OTHER): Payer: Medicare Other | Admitting: Internal Medicine

## 2019-08-02 ENCOUNTER — Encounter: Payer: Self-pay | Admitting: Internal Medicine

## 2019-08-02 DIAGNOSIS — Z85820 Personal history of malignant melanoma of skin: Secondary | ICD-10-CM | POA: Diagnosis not present

## 2019-08-02 DIAGNOSIS — R208 Other disturbances of skin sensation: Secondary | ICD-10-CM | POA: Diagnosis not present

## 2019-08-02 DIAGNOSIS — F320 Major depressive disorder, single episode, mild: Secondary | ICD-10-CM

## 2019-08-02 DIAGNOSIS — R200 Anesthesia of skin: Secondary | ICD-10-CM

## 2019-08-02 DIAGNOSIS — F32A Depression, unspecified: Secondary | ICD-10-CM

## 2019-08-02 DIAGNOSIS — D0511 Intraductal carcinoma in situ of right breast: Secondary | ICD-10-CM

## 2019-08-02 DIAGNOSIS — Z9109 Other allergy status, other than to drugs and biological substances: Secondary | ICD-10-CM

## 2019-08-02 DIAGNOSIS — I10 Essential (primary) hypertension: Secondary | ICD-10-CM

## 2019-08-02 DIAGNOSIS — E7800 Pure hypercholesterolemia, unspecified: Secondary | ICD-10-CM | POA: Diagnosis not present

## 2019-08-02 DIAGNOSIS — E1165 Type 2 diabetes mellitus with hyperglycemia: Secondary | ICD-10-CM

## 2019-08-02 DIAGNOSIS — G7000 Myasthenia gravis without (acute) exacerbation: Secondary | ICD-10-CM

## 2019-08-02 NOTE — Progress Notes (Signed)
Patient ID: Cynthia [name], female   DOB: [birth year], 86 y.o.   MRN: [id number]   Subjective:    Patient ID: Cynthia [name], female    DOB: October 22, 1931, 86 y.o.   MRN: [id number]  HPI This visit occurred during the SARS-CoV-2 public health emergency.  Safety protocols were in place, including screening questions prior to the visit, additional usage of staff PPE, and extensive cleaning of exam room while observing appropriate contact time as indicated for disinfecting solutions.  Patient here for a scheduled follow up. Increased stress.  Discussed with her today.  She does not feel needs any further intervention at this time.  Tries to stay active.  No chest pain or sob reported.  Eating.  No acid reflux or abdominal pain reported.  Bowels moving.  Reports sugars averaging 130s in the am.  Does report left foot numb. Has tried gabapentin and lyrica.  Also has tried alpha lipoic acid.  No change in symptoms.  Desires no further intervention.    Past Medical History:  Diagnosis Date  . Allergic rhinitis   . Breast cancer ([place]) 2014   RT LUMPECTOMY  . 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 Resected margins negative. Whole breast radiation  . Cancer ([place]) 12-15-12   left upper arm, malignant melanoma Clark level II, Breslow thickness: 0.35 mm.  . Diabetes mellitus ([place])   . Hypercholesterolemia   . Hypertension   . Myasthenia gravis ([place])    ocular  . Personal history of radiation therapy 2014   Rt. Breast  . S/P radiation therapy 2015   BREAST CA   Past Surgical History:  Procedure Laterality Date  . BREAST BIOPSY Right 2014   DCIS  . BREAST LUMPECTOMY Right 2014   Lumpectomy  . BREAST SURGERY Right 2014   lumpectomy  . EYE SURGERY     Caterac both eyes   Family History  Problem Relation Age of Onset  . Skin cancer Father   . Cancer Father        colon  . Colon cancer Brother   . Cancer Brother        colon  . Breast cancer Other        niece  . Skin  cancer Sister    Social History   Socioeconomic History  . Marital status: Divorced    Spouse name: Not on file  . Number of children: Not on file  . Years of education: Not on file  . Highest education level: Not on file  Occupational History  . Not on file  Tobacco Use  . Smoking status: Never Smoker  . Smokeless tobacco: Never Used  Substance and Sexual Activity  . Alcohol use: No    Alcohol/week: 0.0 standard drinks  . Drug use: No  . Sexual activity: Never  Other Topics Concern  . Not on file  Social History Narrative  . Not on file   Social Determinants of Health   Financial Resource Strain:   . Difficulty of Paying Living Expenses:   Food Insecurity:   . Worried About Charity fundraiser in the Last Year:   . Arboriculturist in the Last Year:   Transportation Needs:   . Film/video editor (Medical):   Marland Kitchen Lack of Transportation (Non-Medical):   Physical Activity:   . Days of Exercise per Week:   . Minutes of Exercise per Session:   Stress:   . Feeling of  Stress :   Social Connections:   . Frequency of Communication with Friends and Family:   . Frequency of Social Gatherings with Friends and Family:   . Attends Religious Services:   . Active Member of Clubs or Organizations:   . Attends Archivist Meetings:   Marland Kitchen Marital Status:     Outpatient Encounter Medications as of 08/02/2019  Medication Sig  . ACCU-CHEK AVIVA PLUS test strip 1 EACH BY OTHER ROUTE AS NEEDED FOR OTHER. USE AS INSTRUCTED TO CHECK BLOOD SUGAR ONCE DAILY.DX E11.9  . ACCU-CHEK SOFTCLIX LANCETS lancets USE ONCE A DAY  . amLODipine (NORVASC) 10 MG tablet Take 1 tablet (10 mg total) by mouth daily.  Marland Kitchen aspirin 81 MG tablet Take 81 mg by mouth daily.  Marland Kitchen atorvastatin (LIPITOR) 20 MG tablet TAKE 1 TABLET EVERY DAY  . Calcium Carbonate-Vitamin D (CALCIUM 600+D) 600-400 MG-UNIT per tablet Take 1 tablet by mouth 2 (two) times daily.  Marland Kitchen CRANBERRY PO Take by mouth daily.  Marland Kitchen guaiFENesin  (MUCINEX) 600 MG 12 hr tablet Take 1 tablet (600 mg total) by mouth 2 (two) times daily.  . hydrochlorothiazide (HYDRODIURIL) 25 MG tablet TAKE 1 TABLET EVERY DAY  . Multiple Vitamins-Minerals (ICAPS AREDS 2) CAPS Take 1 capsule by mouth 2 (two) times daily.  . Multiple Vitamins-Minerals (PRESERVISION AREDS 2+MULTI VIT PO) Take by mouth.  . potassium chloride (KLOR-CON) 10 MEQ tablet TAKE 1 TABLET EVERY DAY  . ramipril (ALTACE) 10 MG capsule TAKE 1 CAPSULE EVERY DAY  . tamoxifen (NOLVADEX) 20 MG tablet TAKE 1 TABLET EVERY DAY  . traZODone (DESYREL) 50 MG tablet TAKE 1 1/2 - 2 TABLETS AT BEDTIME  . [DISCONTINUED] cephALEXin (KEFLEX) 500 MG capsule Take 1 capsule (500 mg total) by mouth 2 (two) times daily.   No facility-administered encounter medications on file as of 08/02/2019.    Review of Systems  Constitutional: Negative for appetite change and unexpected weight change.  HENT: Negative for congestion and sinus pressure.   Respiratory: Negative for cough, chest tightness and shortness of breath.   Cardiovascular: Negative for chest pain, palpitations and leg swelling.  Gastrointestinal: Negative for abdominal pain, diarrhea, nausea and vomiting.  Genitourinary: Negative for difficulty urinating and dysuria.  Musculoskeletal: Negative for joint swelling and myalgias.  Skin: Negative for color change and rash.  Neurological: Negative for dizziness, light-headedness and headaches.  Psychiatric/Behavioral: Negative for agitation and dysphoric mood.       Objective:    Physical Exam Vitals reviewed.  Constitutional:      General: She is not in acute distress.    Appearance: Normal appearance.  HENT:     Head: Normocephalic and atraumatic.     Right Ear: External ear normal.     Left Ear: External ear normal.  Eyes:     General: No scleral icterus.       Right eye: No discharge.        Left eye: No discharge.     Conjunctiva/sclera: Conjunctivae normal.  Neck:     Thyroid: No  thyromegaly.  Cardiovascular:     Rate and Rhythm: Normal rate and regular rhythm.  Pulmonary:     Effort: No respiratory distress.     Breath sounds: Normal breath sounds. No wheezing.  Abdominal:     General: Bowel sounds are normal.     Palpations: Abdomen is soft.     Tenderness: There is no abdominal tenderness.  Musculoskeletal:        General: No  swelling or tenderness.     Cervical back: Neck supple. No tenderness.  Lymphadenopathy:     Cervical: No cervical adenopathy.  Skin:    Findings: No erythema or rash.  Neurological:     Mental Status: She is alert.  Psychiatric:        Mood and Affect: Mood normal.        Behavior: Behavior normal.     BP 120/62   Pulse 84   Temp 98.1 F (36.7 C)   Resp 16   Ht 5' 3" (1.6 m)   Wt 143 lb (64.9 kg)   SpO2 99%   BMI 25.33 kg/m  Wt Readings from Last 3 Encounters:  08/02/19 143 lb (64.9 kg)  07/06/19 143 lb (64.9 kg)  07/04/19 141 lb (64 kg)     Lab Results  Component Value Date   WBC 6.7 07/06/2019   HGB 12.5 07/06/2019   HCT 37.8 07/06/2019   PLT 233.0 07/06/2019   GLUCOSE 129 (H) 07/06/2019   CHOL 154 07/06/2019   TRIG 110.0 07/06/2019   HDL 48.20 07/06/2019   LDLCALC 84 07/06/2019   ALT 13 07/06/2019   AST 18 07/06/2019   NA 137 07/06/2019   K 3.6 07/06/2019   CL 99 07/06/2019   CREATININE 0.90 07/06/2019   BUN 14 07/06/2019   CO2 30 07/06/2019   TSH 1.07 07/06/2019   HGBA1C 7.6 (H) 07/06/2019   MICROALBUR 2.4 (H) 07/06/2019    MM 3D SCREEN BREAST BILATERAL  Result Date: 01/10/2019 CLINICAL DATA:  Screening. EXAM: DIGITAL SCREENING BILATERAL MAMMOGRAM WITH TOMO AND CAD COMPARISON:  Previous exam(s). ACR Breast Density Category c: The breast tissue is heterogeneously dense, which may obscure small masses. FINDINGS: There are no findings suspicious for malignancy. Images were processed with CAD. IMPRESSION: No mammographic evidence of malignancy. A result letter of this screening mammogram will be  mailed directly to the patient. RECOMMENDATION: Screening mammogram in one year. (Code:SM-B-01Y) BI-RADS CATEGORY  1: Negative. Electronically Signed   By: Lajean Manes M.D.   On: 01/10/2019 13:33       Assessment & Plan:   Problem List Items Addressed This Visit    DCIS (ductal carcinoma in situ) of breast    Has been followed by Dr Bary Castilla. 12/2018 - [facility] I.         Diabetes mellitus ([place])    Low carb diet and exercise.  Follow met b and a1c.  Last a1c 7.6.        Relevant Orders   Hemoglobin R7E   Basic metabolic panel   Environmental allergies    Controlled.        History of melanoma    Followed by dermatology.        Hypercholesterolemia    On lipitor.  Low cholesterol diet and exercise.  Follow lipid panel and liver function tests.        Relevant Orders   Hepatic function panel   Lipid panel   Hypertension    Blood pressure doing well on amlodipine, ramipril and hctz.  120/66  - check by me.  Continue current medication.  Follow pressures.  Follow metabolic panel.        Mild depression ([place])    Discussed with her today. Desires no further intervention.  Follow.        Myasthenia gravis [facility])    Has been followed by Dr Manuella Ghazi.  Stable.        Numbness of left foot  Discussed further w/up.  She declines.  Has seen neurology.  Has tried lyrica, gabapentin and alpha lipoic acid.  Follow.            Einar Pheasant, MD

## 2019-08-08 ENCOUNTER — Encounter: Payer: Self-pay | Admitting: Internal Medicine

## 2019-08-08 NOTE — Assessment & Plan Note (Signed)
Has been followed by Dr Bary Castilla. 12/2018 - [facility] I.

## 2019-08-08 NOTE — Assessment & Plan Note (Signed)
Discussed with her today. Desires no further intervention.  Follow.

## 2019-08-08 NOTE — Assessment & Plan Note (Signed)
On lipitor.  Low cholesterol diet and exercise.  Follow lipid panel and liver function tests.   

## 2019-08-08 NOTE — Assessment & Plan Note (Signed)
Low carb diet and exercise.  Follow met b and a1c.  Last a1c 7.6.

## 2019-08-08 NOTE — Assessment & Plan Note (Signed)
Followed by dermatology

## 2019-08-08 NOTE — Assessment & Plan Note (Signed)
Blood pressure doing well on amlodipine, ramipril and hctz.  120/66  - check by me.  Continue current medication.  Follow pressures.  Follow metabolic panel.

## 2019-08-08 NOTE — Assessment & Plan Note (Signed)
Discussed further w/up.  She declines.  Has seen neurology.  Has tried lyrica, gabapentin and alpha lipoic acid.  Follow.

## 2019-08-08 NOTE — Assessment & Plan Note (Signed)
Has been followed by Dr Shah.  Stable.  

## 2019-08-08 NOTE — Assessment & Plan Note (Signed)
Controlled.  

## 2019-09-07 ENCOUNTER — Other Ambulatory Visit: Payer: Self-pay | Admitting: Internal Medicine

## 2019-09-28 DIAGNOSIS — H353221 Exudative age-related macular degeneration, left eye, with active choroidal neovascularization: Secondary | ICD-10-CM | POA: Diagnosis not present

## 2019-09-29 ENCOUNTER — Other Ambulatory Visit: Payer: Self-pay | Admitting: Internal Medicine

## 2019-11-02 ENCOUNTER — Other Ambulatory Visit: Payer: Self-pay

## 2019-11-02 ENCOUNTER — Other Ambulatory Visit (INDEPENDENT_AMBULATORY_CARE_PROVIDER_SITE_OTHER): Payer: Medicare Other

## 2019-11-02 DIAGNOSIS — E1165 Type 2 diabetes mellitus with hyperglycemia: Secondary | ICD-10-CM

## 2019-11-02 DIAGNOSIS — E7800 Pure hypercholesterolemia, unspecified: Secondary | ICD-10-CM

## 2019-11-02 LAB — LIPID PANEL
Cholesterol: 132 mg/dL (ref 0–200)
HDL: 44.1 mg/dL (ref >39.00)
LDL Cholesterol: 68 mg/dL (ref 0–99)
NonHDL: 87.63
Total CHOL/HDL Ratio: 3
Triglycerides: 96 mg/dL (ref 0.0–149.0)
VLDL: 19.2 mg/dL (ref 0.0–40.0)

## 2019-11-02 LAB — BASIC METABOLIC PANEL
BUN: 12 mg/dL (ref 6–23)
CO2: 31 mEq/L (ref 19–32)
Calcium: 9.7 mg/dL (ref 8.4–10.5)
Chloride: 103 mEq/L (ref 96–112)
Creatinine, Ser: 0.97 mg/dL (ref 0.40–1.20)
GFR: 54.45 mL/min — ABNORMAL LOW (ref >60.00)
Glucose, Bld: 138 mg/dL — ABNORMAL HIGH (ref 70–99)
Potassium: 4.1 mEq/L (ref 3.5–5.1)
Sodium: 139 mEq/L (ref 135–145)

## 2019-11-02 LAB — HEPATIC FUNCTION PANEL
ALT: 12 U/L (ref 0–35)
AST: 15 U/L (ref 0–37)
Albumin: 3.8 g/dL (ref 3.5–5.2)
Alkaline Phosphatase: 92 U/L (ref 39–117)
Bilirubin, Direct: 0.1 mg/dL (ref 0.0–0.3)
Total Bilirubin: 0.6 mg/dL (ref 0.2–1.2)
Total Protein: 6.4 g/dL (ref 6.0–8.3)

## 2019-11-02 LAB — HEMOGLOBIN A1C: Hgb A1c MFr Bld: 7.3 % — ABNORMAL HIGH (ref 4.6–6.5)

## 2019-11-07 ENCOUNTER — Ambulatory Visit: Payer: Medicare Other | Admitting: Dermatology

## 2019-11-08 ENCOUNTER — Other Ambulatory Visit: Payer: Self-pay | Admitting: Internal Medicine

## 2019-11-21 DIAGNOSIS — E119 Type 2 diabetes mellitus without complications: Secondary | ICD-10-CM | POA: Diagnosis not present

## 2019-11-21 DIAGNOSIS — M170 Bilateral primary osteoarthritis of knee: Secondary | ICD-10-CM | POA: Diagnosis not present

## 2019-11-21 DIAGNOSIS — G7000 Myasthenia gravis without (acute) exacerbation: Secondary | ICD-10-CM | POA: Diagnosis not present

## 2019-11-30 DIAGNOSIS — Z23 Encounter for immunization: Secondary | ICD-10-CM | POA: Diagnosis not present

## 2019-11-30 DIAGNOSIS — H353221 Exudative age-related macular degeneration, left eye, with active choroidal neovascularization: Secondary | ICD-10-CM | POA: Diagnosis not present

## 2019-12-01 ENCOUNTER — Other Ambulatory Visit: Payer: Self-pay | Admitting: General Surgery

## 2019-12-01 DIAGNOSIS — D0511 Intraductal carcinoma in situ of right breast: Secondary | ICD-10-CM

## 2019-12-06 ENCOUNTER — Encounter: Payer: Self-pay | Admitting: Internal Medicine

## 2019-12-06 ENCOUNTER — Ambulatory Visit (INDEPENDENT_AMBULATORY_CARE_PROVIDER_SITE_OTHER): Payer: Medicare Other | Admitting: Internal Medicine

## 2019-12-06 ENCOUNTER — Other Ambulatory Visit: Payer: Self-pay

## 2019-12-06 VITALS — BP 120/60 | HR 71 | Temp 98.0°F | Resp 16 | Ht 63.0 in | Wt 139.4 lb

## 2019-12-06 DIAGNOSIS — F320 Major depressive disorder, single episode, mild: Secondary | ICD-10-CM

## 2019-12-06 DIAGNOSIS — Z0000 Encounter for general adult medical examination without abnormal findings: Secondary | ICD-10-CM

## 2019-12-06 DIAGNOSIS — F32A Depression, unspecified: Secondary | ICD-10-CM

## 2019-12-06 DIAGNOSIS — E1165 Type 2 diabetes mellitus with hyperglycemia: Secondary | ICD-10-CM

## 2019-12-06 DIAGNOSIS — I10 Essential (primary) hypertension: Secondary | ICD-10-CM

## 2019-12-06 DIAGNOSIS — E7800 Pure hypercholesterolemia, unspecified: Secondary | ICD-10-CM

## 2019-12-06 DIAGNOSIS — G7000 Myasthenia gravis without (acute) exacerbation: Secondary | ICD-10-CM

## 2019-12-06 DIAGNOSIS — D0511 Intraductal carcinoma in situ of right breast: Secondary | ICD-10-CM

## 2019-12-06 DIAGNOSIS — Z23 Encounter for immunization: Secondary | ICD-10-CM | POA: Diagnosis not present

## 2019-12-06 NOTE — Assessment & Plan Note (Signed)
Physical today 12/06/19.  Mammogram 01/10/19 - Birads I.  Plans to f/u with Dr Bary Castilla for f/u breast exam and mammogram.  Mammogram ordered.

## 2019-12-06 NOTE — Progress Notes (Signed)
Patient ID: Cynthia [name], female   DOB: June 16, 1931, 86 y.o.   MRN: [id number]   Subjective:    Patient ID: Cynthia [name], female    DOB: 1931-08-04, 86 y.o.   MRN: [id number]  HPI This visit occurred during the SARS-CoV-2 public health emergency.  Safety protocols were in place, including screening questions prior to the visit, additional usage of staff PPE, and extensive cleaning of exam room while observing appropriate contact time as indicated for disinfecting solutions.  Patient here for her physical exam. Here to follow up regarding her blood pressure, sugar and cholesterol.  She feels she is doing relatively well.  Tries to stay active.  No chest pain or sob reported.  No abdominal pain.  Bowels moving.  AM sugars averaging 117-120.  Seeing ortho for her knees.  S/p orthovisc injections x 3.  Received her last one yesterday.  Blood pressure doing well.    Past Medical History:  Diagnosis Date  . Allergic rhinitis   . Breast cancer ([place]) 2014   RT LUMPECTOMY  . 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 Resected margins negative. Whole breast radiation  . Cancer ([place]) 12-15-12   left upper arm, malignant melanoma Clark level II, Breslow thickness: 0.35 mm.  . Diabetes mellitus ([place])   . Hypercholesterolemia   . Hypertension   . Myasthenia gravis ([place])    ocular  . Personal history of radiation therapy 2014   Rt. Breast  . S/P radiation therapy 2015   BREAST CA   Past Surgical History:  Procedure Laterality Date  . BREAST BIOPSY Right 2014   DCIS  . BREAST LUMPECTOMY Right 2014   Lumpectomy  . BREAST SURGERY Right 2014   lumpectomy  . EYE SURGERY     Caterac both eyes   Family History  Problem Relation Age of Onset  . Skin cancer Father   . Cancer Father        colon  . Colon cancer Brother   . Cancer Brother        colon  . Breast cancer Other        niece  . Skin cancer Sister    Social History   Socioeconomic History  . Marital  status: Divorced    Spouse name: Not on file  . Number of children: Not on file  . Years of education: Not on file  . Highest education level: Not on file  Occupational History  . Not on file  Tobacco Use  . Smoking status: Never Smoker  . Smokeless tobacco: Never Used  Substance and Sexual Activity  . Alcohol use: No    Alcohol/week: 0.0 standard drinks  . Drug use: No  . Sexual activity: Never  Other Topics Concern  . Not on file  Social History Narrative  . Not on file   Social Determinants of Health   Financial Resource Strain:   . Difficulty of Paying Living Expenses: Not on file  Food Insecurity:   . Worried About Charity fundraiser in the Last Year: Not on file  . Ran Out of Food in the Last Year: Not on file  Transportation Needs:   . Lack of Transportation (Medical): Not on file  . Lack of Transportation (Non-Medical): Not on file  Physical Activity:   . Days of Exercise per Week: Not on file  . Minutes of Exercise per Session: Not on file  Stress:   . Feeling of Stress :  Not on file  Social Connections:   . Frequency of Communication with Friends and Family: Not on file  . Frequency of Social Gatherings with Friends and Family: Not on file  . Attends Religious Services: Not on file  . Active Member of Clubs or Organizations: Not on file  . Attends Archivist Meetings: Not on file  . Marital Status: Not on file    Outpatient Encounter Medications as of 12/06/2019  Medication Sig  . ACCU-CHEK AVIVA PLUS test strip 1 EACH BY OTHER ROUTE AS NEEDED FOR OTHER. USE AS INSTRUCTED TO CHECK BLOOD SUGAR ONCE DAILY.DX E11.9  . ACCU-CHEK SOFTCLIX LANCETS lancets USE ONCE A DAY  . amLODipine (NORVASC) 10 MG tablet TAKE 1 TABLET EVERY DAY  . aspirin 81 MG tablet Take 81 mg by mouth daily.  Marland Kitchen atorvastatin (LIPITOR) 20 MG tablet TAKE 1 TABLET EVERY DAY  . Calcium Carbonate-Vitamin D (CALCIUM 600+D) 600-400 MG-UNIT per tablet Take 1 tablet by mouth 2 (two) times  daily.  Marland Kitchen CRANBERRY PO Take by mouth daily.  . hydrochlorothiazide (HYDRODIURIL) 25 MG tablet TAKE 1 TABLET EVERY DAY  . Multiple Vitamins-Minerals (ICAPS AREDS 2) CAPS Take 1 capsule by mouth 2 (two) times daily.  . Multiple Vitamins-Minerals (PRESERVISION AREDS 2+MULTI VIT PO) Take by mouth.  . potassium chloride (KLOR-CON) 10 MEQ tablet TAKE 1 TABLET EVERY DAY  . ramipril (ALTACE) 10 MG capsule TAKE 1 CAPSULE EVERY DAY  . [DISCONTINUED] guaiFENesin (MUCINEX) 600 MG 12 hr tablet Take 1 tablet (600 mg total) by mouth 2 (two) times daily.  . [DISCONTINUED] tamoxifen (NOLVADEX) 20 MG tablet TAKE 1 TABLET EVERY DAY  . [DISCONTINUED] traZODone (DESYREL) 50 MG tablet TAKE 1 1/2 - 2 TABLETS AT BEDTIME   No facility-administered encounter medications on file as of 12/06/2019.    Review of Systems  Constitutional: Negative for appetite change and unexpected weight change.  HENT: Negative for congestion, sinus pressure and sore throat.   Eyes: Negative for pain and visual disturbance.  Respiratory: Negative for cough, chest tightness and shortness of breath.   Cardiovascular: Negative for chest pain, palpitations and leg swelling.  Gastrointestinal: Negative for abdominal pain, constipation, diarrhea and vomiting.  Genitourinary: Negative for difficulty urinating and dysuria.  Musculoskeletal: Negative for joint swelling and myalgias.       Seeing ortho for her knees.   Skin: Negative for color change and rash.  Neurological: Negative for dizziness, light-headedness and headaches.  Hematological: Negative for adenopathy. Does not bruise/bleed easily.  Psychiatric/Behavioral: Negative for agitation and dysphoric mood.       Objective:    Physical Exam Vitals reviewed.  Constitutional:      General: She is not in acute distress.    Appearance: Normal appearance. She is well-developed.  HENT:     Head: Normocephalic and atraumatic.     Right Ear: External ear normal.     Left Ear:  External ear normal.  Eyes:     General: No scleral icterus.       Right eye: No discharge.        Left eye: No discharge.     Conjunctiva/sclera: Conjunctivae normal.  Neck:     Thyroid: No thyromegaly.  Cardiovascular:     Rate and Rhythm: Normal rate and regular rhythm.  Pulmonary:     Effort: No tachypnea, accessory muscle usage or respiratory distress.     Breath sounds: Normal breath sounds. No decreased breath sounds or wheezing.  Chest:  Breasts:        Right: No inverted nipple, mass, nipple discharge or tenderness (no axillary adenopathy).        Left: No inverted nipple, mass, nipple discharge or tenderness (no axilarry adenopathy).  Abdominal:     General: Bowel sounds are normal.     Palpations: Abdomen is soft.     Tenderness: There is no abdominal tenderness.  Musculoskeletal:        General: No swelling or tenderness.     Cervical back: Neck supple. No tenderness.  Lymphadenopathy:     Cervical: No cervical adenopathy.  Skin:    Findings: No erythema or rash.  Neurological:     Mental Status: She is alert and oriented to person, place, and time.  Psychiatric:        Mood and Affect: Mood normal.        Behavior: Behavior normal.     BP 120/60   Pulse 71   Temp 98 F (36.7 C) (Oral)   Resp 16   Ht '5\' 3"'  (1.6 m)   Wt 139 lb 6.4 oz (63.2 kg)   SpO2 99%   BMI 24.69 kg/m  Wt Readings from Last 3 Encounters:  12/06/19 139 lb 6.4 oz (63.2 kg)  08/02/19 143 lb (64.9 kg)  07/06/19 143 lb (64.9 kg)     Lab Results  Component Value Date   WBC 6.7 07/06/2019   HGB 12.5 07/06/2019   HCT 37.8 07/06/2019   PLT 233.0 07/06/2019   GLUCOSE 138 (H) 11/02/2019   CHOL 132 11/02/2019   TRIG 96.0 11/02/2019   HDL 44.10 11/02/2019   LDLCALC 68 11/02/2019   ALT 12 11/02/2019   AST 15 11/02/2019   NA 139 11/02/2019   K 4.1 11/02/2019   CL 103 11/02/2019   CREATININE 0.97 11/02/2019   BUN 12 11/02/2019   CO2 31 11/02/2019   TSH 1.07 07/06/2019   HGBA1C  7.3 (H) 11/02/2019   MICROALBUR 2.4 (H) 07/06/2019    MM 3D SCREEN BREAST BILATERAL  Result Date: 01/10/2019 CLINICAL DATA:  Screening. EXAM: DIGITAL SCREENING BILATERAL MAMMOGRAM WITH TOMO AND CAD COMPARISON:  Previous exam(s). ACR Breast Density Category c: The breast tissue is heterogeneously dense, which may obscure small masses. FINDINGS: There are no findings suspicious for malignancy. Images were processed with CAD. IMPRESSION: No mammographic evidence of malignancy. A result letter of this screening mammogram will be mailed directly to the patient. RECOMMENDATION: Screening mammogram in one year. (Code:SM-B-01Y) BI-RADS CATEGORY  1: Negative. Electronically Signed   By: Lajean Manes M.D.   On: 01/10/2019 13:33       Assessment & Plan:   Problem List Items Addressed This Visit    Myasthenia gravis [facility])    Has been followed by Dr Manuella Ghazi.  Stable.       Mild depression ([facility])    Overall appears to be doing better.  Follow.        Hypertension    Blood pressure doing well on amlodipine, ramipril and hctz.  Follow pressures.  Follow metabolic panel.       Relevant Orders   CBC with Differential/Platelet   Hypercholesterolemia    On lipitor.  Low cholesterol diet and exercise.  Follow lipid panel and liver function tests.        Relevant Orders   Hepatic function panel   Lipid panel   Health care maintenance    Physical today 12/06/19.  Mammogram 01/10/19 - Bir[facility] I.  Plans to  f/u with Dr Bary Castilla for f/u breast exam and mammogram.  Mammogram ordered.        Diabetes mellitus ([place])    Low carb diet and exercise.  Follow met b and a1c. Sugars as outlined.        Relevant Orders   Hemoglobin J6R   Basic metabolic panel   DCIS (ductal carcinoma in situ) of breast    Followed by Dr Bary Castilla as outlined.  Mammogram 12/2018 - [facility] I.        Other Visit Diagnoses    Routine general medical examination at a health care facility    -  Primary   Need for immunization  against influenza       Relevant Orders   Flu Vaccine QUAD High Dose(Fluad) (Completed)       Einar Pheasant, MD

## 2019-12-11 ENCOUNTER — Encounter: Payer: Self-pay | Admitting: Internal Medicine

## 2019-12-11 NOTE — Assessment & Plan Note (Signed)
Followed by Dr Bary Castilla as outlined.  Mammogram 12/2018 - [facility] I.

## 2019-12-11 NOTE — Assessment & Plan Note (Signed)
On lipitor.  Low cholesterol diet and exercise.  Follow lipid panel and liver function tests.   

## 2019-12-11 NOTE — Assessment & Plan Note (Signed)
Low carb diet and exercise.  Follow met b and a1c. Sugars as outlined.   

## 2019-12-11 NOTE — Assessment & Plan Note (Signed)
Blood pressure doing well on amlodipine, ramipril and hctz.  Follow pressures.  Follow metabolic panel.   

## 2019-12-11 NOTE — Assessment & Plan Note (Signed)
Overall appears to be doing better.  Follow.  

## 2019-12-11 NOTE — Assessment & Plan Note (Signed)
Has been followed by Dr Shah.  Stable.  

## 2020-01-09 ENCOUNTER — Telehealth: Payer: Self-pay | Admitting: Internal Medicine

## 2020-01-09 MED ORDER — POTASSIUM CHLORIDE CRYS ER 10 MEQ PO TBCR: 10.0000 meq | EXTENDED_RELEASE_TABLET | Freq: Every day | ORAL | 0 refills | Status: DC

## 2020-01-09 NOTE — Telephone Encounter (Signed)
Patient called in for refill for potassium chloride (KLOR-CON) 10 MEQ tablet

## 2020-01-13 NOTE — Telephone Encounter (Signed)
Pharmacy has been switched to [place]. Potassium Chloride has been refilled on 01/09/2020.

## 2020-01-13 NOTE — Telephone Encounter (Signed)
Pt called again and needs to go over her med list and needs to switch her pharmacy to [facility]  And to follow up on message below  Pt is out of medication

## 2020-01-13 NOTE — Telephone Encounter (Signed)
Patient called and said she is out of her potassium and her new Pharmacy is through [facility]. Ask patient to bring card to office.

## 2020-01-16 ENCOUNTER — Other Ambulatory Visit: Payer: Self-pay

## 2020-01-16 MED ORDER — POTASSIUM CHLORIDE CRYS ER 10 MEQ PO TBCR: 10.0000 meq | EXTENDED_RELEASE_TABLET | Freq: Every day | ORAL | 0 refills | Status: DC

## 2020-01-16 NOTE — Telephone Encounter (Signed)
Noted  

## 2020-01-17 ENCOUNTER — Ambulatory Visit
Admission: RE | Admit: 2020-01-17 | Discharge: 2020-01-17 | Disposition: A | Discharge status: Home / Self Care | Payer: Medicare Other | Source: Ambulatory Visit | Attending: General Surgery | Admitting: General Surgery

## 2020-01-17 ENCOUNTER — Other Ambulatory Visit: Payer: Self-pay

## 2020-01-17 DIAGNOSIS — Z853 Personal history of malignant neoplasm of breast: Secondary | ICD-10-CM | POA: Diagnosis not present

## 2020-01-17 DIAGNOSIS — Z1231 Encounter for screening mammogram for malignant neoplasm of breast: Secondary | ICD-10-CM | POA: Diagnosis not present

## 2020-01-17 DIAGNOSIS — Z923 Personal history of irradiation: Secondary | ICD-10-CM | POA: Insufficient documentation

## 2020-01-17 DIAGNOSIS — D0511 Intraductal carcinoma in situ of right breast: Secondary | ICD-10-CM

## 2020-01-23 ENCOUNTER — Other Ambulatory Visit: Payer: Self-pay

## 2020-01-23 MED ORDER — RAMIPRIL 10 MG PO CAPS
10.0000 mg | ORAL_CAPSULE | Freq: Every day | ORAL | 1 refills | Status: DC
Start: 2020-01-23 — End: 2020-09-26

## 2020-01-23 MED ORDER — ACCU-CHEK AVIVA PLUS VI STRP: ORAL_STRIP | 3 refills | Status: DC

## 2020-01-23 MED ORDER — AMLODIPINE BESYLATE 10 MG PO TABS
10.0000 mg | ORAL_TABLET | Freq: Every day | ORAL | 1 refills | Status: DC
Start: 2020-01-23 — End: 2020-08-14

## 2020-01-23 MED ORDER — HYDROCHLOROTHIAZIDE 25 MG PO TABS
25.0000 mg | ORAL_TABLET | Freq: Every day | ORAL | 1 refills | Status: DC
Start: 2020-01-23 — End: 2020-08-14

## 2020-01-23 MED ORDER — ATORVASTATIN CALCIUM 20 MG PO TABS: 20.0000 mg | ORAL_TABLET | Freq: Every day | ORAL | 2 refills | Status: DC

## 2020-02-18 ENCOUNTER — Other Ambulatory Visit: Payer: Self-pay | Admitting: Internal Medicine

## 2020-04-09 ENCOUNTER — Other Ambulatory Visit: Payer: No Typology Code available for payment source

## 2020-04-10 ENCOUNTER — Other Ambulatory Visit: Payer: Self-pay

## 2020-04-10 ENCOUNTER — Ambulatory Visit (INDEPENDENT_AMBULATORY_CARE_PROVIDER_SITE_OTHER): Payer: Medicare Other | Admitting: Internal Medicine

## 2020-04-10 VITALS — BP 112/70 | HR 65 | Temp 97.9°F | Resp 16 | Ht 63.0 in | Wt 141.0 lb

## 2020-04-10 DIAGNOSIS — E7800 Pure hypercholesterolemia, unspecified: Secondary | ICD-10-CM

## 2020-04-10 DIAGNOSIS — G7000 Myasthenia gravis without (acute) exacerbation: Secondary | ICD-10-CM

## 2020-04-10 DIAGNOSIS — F32A Depression, unspecified: Secondary | ICD-10-CM

## 2020-04-10 DIAGNOSIS — F320 Major depressive disorder, single episode, mild: Secondary | ICD-10-CM

## 2020-04-10 DIAGNOSIS — I10 Essential (primary) hypertension: Secondary | ICD-10-CM | POA: Diagnosis not present

## 2020-04-10 DIAGNOSIS — D0511 Intraductal carcinoma in situ of right breast: Secondary | ICD-10-CM

## 2020-04-10 DIAGNOSIS — E1165 Type 2 diabetes mellitus with hyperglycemia: Secondary | ICD-10-CM

## 2020-04-10 LAB — HM DIABETES FOOT EXAM

## 2020-04-10 LAB — GLUCOSE, POCT (MANUAL RESULT ENTRY): POC Glucose: 116 mg/dl — AB (ref 70–99)

## 2020-04-10 NOTE — Progress Notes (Signed)
Patient ID: Cynthia [name], female   DOB: [birth year], 87 y.o.   MRN: [id number]   Subjective:    Patient ID: Cynthia [name], female    DOB: 05/31/1931, 87 y.o.   MRN: [id number]  HPI This visit occurred during the SARS-CoV-2 public health emergency.  Safety protocols were in place, including screening questions prior to the visit, additional usage of staff PPE, and extensive cleaning of exam room while observing appropriate contact time as indicated for disinfecting solutions.  Patient here for a scheduled follow up.  Here to follow up regarding her blood pressure, blood sugar and cholesterol.  Reports she is doing relatively well.  Tries to stay active.  No chest pain or sob reported.  Eating.  No nausea or vomiting.  Bowels moving.  Blood sugars in am 120s.  Sees Dr Bary Castilla for f/u breast cancer.  evaluated 12/2019.  Mammogram 01/17/20 - birads I.  Recommended f/u in one year.  States is up to date with eye exams - [facility].   Past Medical History:  Diagnosis Date  . Allergic rhinitis   . Breast cancer ([place]) 2014   RT LUMPECTOMY  . 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 Resected margins negative. Whole breast radiation  . Cancer ([place]) 12-15-12   left upper arm, malignant melanoma Clark level II, Breslow thickness: 0.35 mm.  . Diabetes mellitus ([place])   . Hypercholesterolemia   . Hypertension   . Myasthenia gravis ([place])    ocular  . Personal history of radiation therapy 2014   Rt. Breast  . S/P radiation therapy 2015   BREAST CA   Past Surgical History:  Procedure Laterality Date  . BREAST BIOPSY Right 2014   DCIS  . BREAST LUMPECTOMY Right 2014   Lumpectomy  . BREAST SURGERY Right 2014   lumpectomy  . EYE SURGERY     Caterac both eyes   Family History  Problem Relation Age of Onset  . Skin cancer Father   . Cancer Father        colon  . Colon cancer Brother   . Cancer Brother        colon  . Breast cancer Other        niece  . Skin  cancer Sister    Social History   Socioeconomic History  . Marital status: Divorced    Spouse name: Not on file  . Number of children: Not on file  . Years of education: Not on file  . Highest education level: Not on file  Occupational History  . Not on file  Tobacco Use  . Smoking status: Never Smoker  . Smokeless tobacco: Never Used  Substance and Sexual Activity  . Alcohol use: No    Alcohol/week: 0.0 standard drinks  . Drug use: No  . Sexual activity: Never  Other Topics Concern  . Not on file  Social History Narrative  . Not on file   Social Determinants of Health   Financial Resource Strain: Not on file  Food Insecurity: Not on file  Transportation Needs: Not on file  Physical Activity: Not on file  Stress: Not on file  Social Connections: Not on file    Outpatient Encounter Medications as of 04/10/2020  Medication Sig  . ACCU-CHEK SOFTCLIX LANCETS lancets USE ONCE A DAY  . amLODipine (NORVASC) 10 MG tablet Take 1 tablet (10 mg total) by mouth daily.  Marland Kitchen aspirin 81 MG tablet Take 81 mg  by mouth daily.  Marland Kitchen atorvastatin (LIPITOR) 20 MG tablet Take 1 tablet (20 mg total) by mouth daily.  . Calcium Carbonate-Vitamin D 600-400 MG-UNIT tablet Take 1 tablet by mouth 2 (two) times daily.  Marland Kitchen CRANBERRY PO Take by mouth daily.  Marland Kitchen glucose blood (ACCU-CHEK AVIVA PLUS) test strip 1 EACH BY OTHER ROUTE AS NEEDED FOR OTHER. USE AS INSTRUCTED TO CHECK BLOOD SUGAR ONCE DAILY.DX E11.9  . hydrochlorothiazide (HYDRODIURIL) 25 MG tablet Take 1 tablet (25 mg total) by mouth daily.  . Multiple Vitamins-Minerals (ICAPS AREDS 2) CAPS Take 1 capsule by mouth 2 (two) times daily.  . Multiple Vitamins-Minerals (PRESERVISION AREDS 2+MULTI VIT PO) Take by mouth.  . potassium chloride (KLOR-CON) 10 MEQ tablet TAKE 1 TABLET BY MOUTH  DAILY  . ramipril (ALTACE) 10 MG capsule Take 1 capsule (10 mg total) by mouth daily.   No facility-administered encounter medications on file as of 04/10/2020.     Review of Systems  Constitutional: Negative for appetite change and unexpected weight change.  HENT: Negative for congestion and sinus pressure.   Respiratory: Negative for cough, chest tightness and shortness of breath.   Cardiovascular: Negative for chest pain, palpitations and leg swelling.  Gastrointestinal: Negative for abdominal pain, diarrhea, nausea and vomiting.  Genitourinary: Negative for difficulty urinating and dysuria.  Musculoskeletal: Negative for joint swelling and myalgias.  Skin: Negative for color change and rash.  Neurological: Negative for dizziness, light-headedness and headaches.  Psychiatric/Behavioral: Negative for agitation and dysphoric mood.       Objective:    Physical Exam Vitals reviewed.  Constitutional:      General: She is not in acute distress.    Appearance: Normal appearance.  HENT:     Head: Normocephalic and atraumatic.     Right Ear: External ear normal.     Left Ear: External ear normal.     Mouth/Throat:     Mouth: Oropharynx is clear and moist.  Eyes:     General: No scleral icterus.       Right eye: No discharge.        Left eye: No discharge.  Neck:     Thyroid: No thyromegaly.  Cardiovascular:     Rate and Rhythm: Normal rate and regular rhythm.  Pulmonary:     Effort: No respiratory distress.     Breath sounds: Normal breath sounds. No wheezing.  Abdominal:     General: Bowel sounds are normal.     Palpations: Abdomen is soft.     Tenderness: There is no abdominal tenderness.  Musculoskeletal:        General: No swelling, tenderness or edema.     Cervical back: Neck supple. No tenderness.  Lymphadenopathy:     Cervical: No cervical adenopathy.  Skin:    Findings: No erythema or rash.  Neurological:     Mental Status: She is alert.  Psychiatric:        Mood and Affect: Mood normal.        Behavior: Behavior normal.     BP 112/70   Pulse 65   Temp 97.9 F (36.6 C) (Oral)   Resp 16   Ht '5\' 3"'  (1.6 m)    Wt 141 lb (64 kg)   SpO2 97%   BMI 24.98 kg/m  Wt Readings from Last 3 Encounters:  04/10/20 141 lb (64 kg)  12/06/19 139 lb 6.4 oz (63.2 kg)  08/02/19 143 lb (64.9 kg)     Lab Results  Component Value  Date   WBC 7.5 04/10/2020   HGB 12.8 04/10/2020   HCT 38.7 04/10/2020   PLT 227.0 04/10/2020   GLUCOSE 116 (H) 04/10/2020   CHOL 145 04/10/2020   TRIG 111.0 04/10/2020   HDL 44.80 04/10/2020   LDLCALC 78 04/10/2020   ALT 12 04/10/2020   AST 14 04/10/2020   NA 139 04/10/2020   K 3.7 04/10/2020   CL 100 04/10/2020   CREATININE 0.94 04/10/2020   BUN 17 04/10/2020   CO2 32 04/10/2020   TSH 0.97 04/10/2020   HGBA1C 7.6 (H) 04/10/2020   MICROALBUR 2.4 (H) 07/06/2019    MM 3D SCREEN BREAST BILATERAL  Result Date: 01/21/2020 CLINICAL DATA:  Screening. History of RIGHT breast cancer in 2014 status post lumpectomy and radiation therapy. EXAM: DIGITAL SCREENING BILATERAL MAMMOGRAM WITH TOMO AND CAD COMPARISON:  Previous exam(s). ACR Breast Density Category c: The breast tissue is heterogeneously dense, which may obscure small masses. FINDINGS: There are no findings suspicious for malignancy. Images were processed with CAD. IMPRESSION: No mammographic evidence of malignancy. A result letter of this screening mammogram will be mailed directly to the patient. RECOMMENDATION: Screening mammogram in one year. (Code:SM-B-01Y) BI-RADS CATEGORY  1: Negative. Electronically Signed   By: Franki Cabot M.D.   On: 01/21/2020 09:40       Assessment & Plan:   Problem List Items Addressed This Visit    DCIS (ductal carcinoma in situ) of breast    Followed by Dr Bary Castilla.  Mammogram 01/17/20 - birads I.  Recommended continue yearly follow up.       Diabetes mellitus ([place])    Low carb diet and exercise.  Sugars as outlined.  Follow met b and a1c.       Relevant Orders   POCT Glucose (CBG) (Completed)   Hypercholesterolemia    On lipitor.  Follow lipid panel and liver function tests.         Hypertension - Primary    Blood pressure doing well on amlodipine, ramipril and hctz.  Follow pressures.  Follow metabolic panel.        Relevant Orders   TSH (Completed)   Mild depression ([facility])    Overall doing better.  Follow.       Myasthenia gravis [facility])    Has been followed by Dr Manuella Ghazi.  Stable.           Einar Pheasant, MD

## 2020-04-11 ENCOUNTER — Telehealth: Payer: Self-pay | Admitting: Internal Medicine

## 2020-04-11 LAB — BASIC METABOLIC PANEL
BUN: 17 mg/dL (ref 6–23)
CO2: 32 mEq/L (ref 19–32)
Calcium: 10.6 mg/dL — ABNORMAL HIGH (ref 8.4–10.5)
Chloride: 100 mEq/L (ref 96–112)
Creatinine, Ser: 0.94 mg/dL (ref 0.40–1.20)
GFR: 54.98 mL/min — ABNORMAL LOW (ref >60.00)
Glucose, Bld: 116 mg/dL — ABNORMAL HIGH (ref 70–99)
Potassium: 3.7 mEq/L (ref 3.5–5.1)
Sodium: 139 mEq/L (ref 135–145)

## 2020-04-11 LAB — CBC WITH DIFFERENTIAL/PLATELET
Basophils Absolute: 0.1 10*3/uL (ref 0.0–0.1)
Basophils Relative: 1 % (ref 0.0–3.0)
Eosinophils Absolute: 0.3 10*3/uL (ref 0.0–0.7)
Eosinophils Relative: 3.8 % (ref 0.0–5.0)
HCT: 38.7 % (ref 36.0–46.0)
Hemoglobin: 12.8 g/dL (ref 12.0–15.0)
Lymphocytes Relative: 29.3 % (ref 12.0–46.0)
Lymphs Abs: 2.2 10*3/uL (ref 0.7–4.0)
MCHC: 33 g/dL (ref 30.0–36.0)
MCV: 83.5 fl (ref 78.0–100.0)
Monocytes Absolute: 0.6 10*3/uL (ref 0.1–1.0)
Monocytes Relative: 7.5 % (ref 3.0–12.0)
Neutro Abs: 4.4 10*3/uL (ref 1.4–7.7)
Neutrophils Relative %: 58.4 % (ref 43.0–77.0)
Platelets: 227 10*3/uL (ref 150.0–400.0)
RBC: 4.63 Mil/uL (ref 3.87–5.11)
RDW: 14.5 % (ref 11.5–15.5)
WBC: 7.5 10*3/uL (ref 4.0–10.5)

## 2020-04-11 LAB — LIPID PANEL
Cholesterol: 145 mg/dL (ref 0–200)
HDL: 44.8 mg/dL (ref >39.00)
LDL Cholesterol: 78 mg/dL (ref 0–99)
NonHDL: 100.25
Total CHOL/HDL Ratio: 3
Triglycerides: 111 mg/dL (ref 0.0–149.0)
VLDL: 22.2 mg/dL (ref 0.0–40.0)

## 2020-04-11 LAB — HEPATIC FUNCTION PANEL
ALT: 12 U/L (ref 0–35)
AST: 14 U/L (ref 0–37)
Albumin: 3.8 g/dL (ref 3.5–5.2)
Alkaline Phosphatase: 100 U/L (ref 39–117)
Bilirubin, Direct: 0.1 mg/dL (ref 0.0–0.3)
Total Bilirubin: 0.6 mg/dL (ref 0.2–1.2)
Total Protein: 7 g/dL (ref 6.0–8.3)

## 2020-04-11 LAB — HEMOGLOBIN A1C: Hgb A1c MFr Bld: 7.6 % — ABNORMAL HIGH (ref 4.6–6.5)

## 2020-04-11 LAB — TSH: TSH: 0.97 u[IU]/mL (ref 0.35–4.50)

## 2020-04-11 NOTE — Telephone Encounter (Signed)
Pt called she received her [place] covid booster shot on 11/30/19

## 2020-04-11 NOTE — Telephone Encounter (Signed)
Documented in chart.

## 2020-04-15 ENCOUNTER — Encounter: Payer: Self-pay | Admitting: Internal Medicine

## 2020-04-15 NOTE — Assessment & Plan Note (Signed)
Followed by Dr Bary Castilla.  Mammogram 01/17/20 - birads I.  Recommended continue yearly follow up.

## 2020-04-15 NOTE — Assessment & Plan Note (Signed)
Overall doing better.  Follow.  

## 2020-04-15 NOTE — Assessment & Plan Note (Signed)
Blood pressure doing well on amlodipine, ramipril and hctz.  Follow pressures.  Follow metabolic panel.

## 2020-04-15 NOTE — Assessment & Plan Note (Signed)
Low carb diet and exercise.  Sugars as outlined.  Follow met b and a1c.   

## 2020-04-15 NOTE — Assessment & Plan Note (Signed)
On lipitor.  Follow lipid panel and liver function tests.   

## 2020-04-15 NOTE — Assessment & Plan Note (Signed)
Has been followed by Dr Shah.  Stable.  

## 2020-06-18 ENCOUNTER — Telehealth: Payer: Self-pay

## 2020-06-18 NOTE — Telephone Encounter (Signed)
Pt dropped off life line screening results for carotid artery, a-fib, etc. Placed in folder up front

## 2020-06-18 NOTE — Telephone Encounter (Signed)
Paperwork filed for next appt

## 2020-07-04 ENCOUNTER — Ambulatory Visit: Payer: Medicare Other

## 2020-07-06 ENCOUNTER — Ambulatory Visit: Payer: Medicare Other

## 2020-07-10 ENCOUNTER — Ambulatory Visit (INDEPENDENT_AMBULATORY_CARE_PROVIDER_SITE_OTHER): Payer: Medicare Other

## 2020-07-10 VITALS — Ht 63.0 in | Wt 141.0 lb

## 2020-07-10 DIAGNOSIS — Z0000 Encounter for general adult medical examination without abnormal findings: Secondary | ICD-10-CM | POA: Diagnosis not present

## 2020-07-10 NOTE — Patient Instructions (Addendum)
Cynthia [name] , Thank you for taking time to come for your Medicare Wellness Visit. I appreciate your ongoing commitment to your health goals. Please review the following plan we discussed and let me know if I can assist you in the future.   These are the goals we discussed: Goals      Patient Stated   .  Good Water Intake (pt-stated)      Monitor diet       This is a list of the screening recommended for you and due dates:  Health Maintenance  Topic Date Due  . Pneumonia vaccines (2 of 2 - PPSV23) 08/14/2020*  . Tetanus Vaccine  07/10/2021*  . Flu Shot  09/24/2020  . Hemoglobin A1C  10/08/2020  . Mammogram  01/16/2021  . Complete foot exam   04/10/2021  . Eye exam for diabetics  05/28/2021  . DEXA scan (bone density measurement)  Completed  . COVID-19 Vaccine  Completed  . HPV Vaccine  Aged Out  *Topic was postponed. The date shown is not the original due date.   Immunizations Immunization History  Administered Date(s) Administered  . Fluad Quad(high Dose 65+) 12/06/2019  . Influenza Split 12/20/2011  . Influenza, High Dose Seasonal PF 12/15/2016, 01/12/2018, 12/25/2018  . Influenza,inj,Quad PF,6+ Mos 11/08/2012, 11/23/2013, 12/14/2014  . Influenza-Unspecified 12/14/2014, 12/10/2015  . PFIZER(Purple Top)SARS-COV-2 Vaccination 04/24/2019, 05/16/2019, 11/30/2019, 07/03/2020  . Pneumococcal Conjugate-13 11/23/2013   Advanced directives: End of life planning; Advance aging; Advanced directives discussed.  Copy of current HCPOA/Living Will requested.    Follow up in one year for your annual wellness visit.  Preventive Care 21 Years and Older, Female Preventive care refers to lifestyle choices and visits with your health care provider that can promote health and wellness. What does preventive care include?  A yearly physical exam. This is also called an annual well check.  Dental exams once or twice a year.  Routine eye exams. Ask your health care provider how often you  should have your eyes checked.  Personal lifestyle choices, including:  Daily care of your teeth and gums.  Regular physical activity.  Eating a healthy diet.  Avoiding tobacco and drug use.  Limiting alcohol use.  Practicing safe sex.  Taking low-dose aspirin every day.  Taking vitamin and mineral supplements as recommended by your health care provider. What happens during an annual well check? The services and screenings done by your health care provider during your annual well check will depend on your age, overall health, lifestyle risk factors, and family history of disease. Counseling  Your health care provider may ask you questions about your:  Alcohol use.  Tobacco use.  Drug use.  Emotional well-being.  Home and relationship well-being.  Sexual activity.  Eating habits.  History of falls.  Memory and ability to understand (cognition).  Work and work Statistician.  Reproductive health. Screening  You may have the following tests or measurements:  Height, weight, and BMI.  Blood pressure.  Lipid and cholesterol levels. These may be checked every 5 years, or more frequently if you are over 65 years old.  Skin check.  Lung cancer screening. You may have this screening every year starting at age 69 if you have a 30-pack-year history of smoking and currently smoke or have quit within the past 15 years.  Fecal occult blood test (FOBT) of the stool. You may have this test every year starting at age 45.  Flexible sigmoidoscopy or colonoscopy. You may have a sigmoidoscopy every  5 years or a colonoscopy every 10 years starting at age 67.  Hepatitis C blood test.  Hepatitis B blood test.  Sexually transmitted disease (STD) testing.  Diabetes screening. This is done by checking your blood sugar (glucose) after you have not eaten for a while (fasting). You may have this done every 1-3 years.  Bone density scan. This is done to screen for osteoporosis.  You may have this done starting at age 11.  Mammogram. This may be done every 1-2 years. Talk to your health care provider about how often you should have regular mammograms. Talk with your health care provider about your test results, treatment options, and if necessary, the need for more tests. Vaccines  Your health care provider may recommend certain vaccines, such as:  Influenza vaccine. This is recommended every year.  Tetanus, diphtheria, and acellular pertussis (Tdap, Td) vaccine. You may need a Td booster every 10 years.  Zoster vaccine. You may need this after age 19.  Pneumococcal 13-valent conjugate (PCV13) vaccine. One dose is recommended after age 41.  Pneumococcal polysaccharide (PPSV23) vaccine. One dose is recommended after age 64. Talk to your health care provider about which screenings and vaccines you need and how often you need them. This information is not intended to replace advice given to you by your health care provider. Make sure you discuss any questions you have with your health care provider. Document Released: 03/09/2015 Document Revised: 10/31/2015 Document Reviewed: 12/12/2014 Elsevier Interactive Patient Education  2017 [place] Prevention in the Home Falls can cause injuries. They can happen to people of all ages. There are many things you can do to make your home safe and to help prevent falls. What can I do on the outside of my home?  Regularly fix the edges of walkways and driveways and fix any cracks.  Remove anything that might make you trip as you walk through a door, such as a raised step or threshold.  Trim any bushes or trees on the path to your home.  Use bright outdoor lighting.  Clear any walking paths of anything that might make someone trip, such as rocks or tools.  Regularly check to see if handrails are loose or broken. Make sure that both sides of any steps have handrails.  Any raised decks and porches should have  guardrails on the edges.  Have any leaves, snow, or ice cleared regularly.  Use sand or salt on walking paths during winter.  Clean up any spills in your garage right away. This includes oil or grease spills. What can I do in the bathroom?  Use night lights.  Install grab bars by the toilet and in the tub and shower. Do not use towel bars as grab bars.  Use non-skid mats or decals in the tub or shower.  If you need to sit down in the shower, use a plastic, non-slip stool.  Keep the floor dry. Clean up any water that spills on the floor as soon as it happens.  Remove soap buildup in the tub or shower regularly.  Attach bath mats securely with double-sided non-slip rug tape.  Do not have throw rugs and other things on the floor that can make you trip. What can I do in the bedroom?  Use night lights.  Make sure that you have a light by your bed that is easy to reach.  Do not use any sheets or blankets that are too big for your bed. They should not  hang down onto the floor.  Have a firm chair that has side arms. You can use this for support while you get dressed.  Do not have throw rugs and other things on the floor that can make you trip. What can I do in the kitchen?  Clean up any spills right away.  Avoid walking on wet floors.  Keep items that you use a lot in easy-to-reach places.  If you need to reach something above you, use a strong step stool that has a grab bar.  Keep electrical cords out of the way.  Do not use floor polish or wax that makes floors slippery. If you must use wax, use non-skid floor wax.  Do not have throw rugs and other things on the floor that can make you trip. What can I do with my stairs?  Do not leave any items on the stairs.  Make sure that there are handrails on both sides of the stairs and use them. Fix handrails that are broken or loose. Make sure that handrails are as long as the stairways.  Check any carpeting to make sure that  it is firmly attached to the stairs. Fix any carpet that is loose or worn.  Avoid having throw rugs at the top or bottom of the stairs. If you do have throw rugs, attach them to the floor with carpet tape.  Make sure that you have a light switch at the top of the stairs and the bottom of the stairs. If you do not have them, ask someone to add them for you. What else can I do to help prevent falls?  Wear shoes that:  Do not have high heels.  Have rubber bottoms.  Are comfortable and fit you well.  Are closed at the toe. Do not wear sandals.  If you use a stepladder:  Make sure that it is fully opened. Do not climb a closed stepladder.  Make sure that both sides of the stepladder are locked into place.  Ask someone to hold it for you, if possible.  Clearly mark and make sure that you can see:  Any grab bars or handrails.  First and last steps.  Where the edge of each step is.  Use tools that help you move around (mobility aids) if they are needed. These include:  Canes.  Walkers.  Scooters.  Crutches.  Turn on the lights when you go into a dark area. Replace any light bulbs as soon as they burn out.  Set up your furniture so you have a clear path. Avoid moving your furniture around.  If any of your floors are uneven, fix them.  If there are any pets around you, be aware of where they are.  Review your medicines with your doctor. Some medicines can make you feel dizzy. This can increase your chance of falling. Ask your doctor what other things that you can do to help prevent falls. This information is not intended to replace advice given to you by your health care provider. Make sure you discuss any questions you have with your health care provider. Document Released: 12/07/2008 Document Revised: 07/19/2015 Document Reviewed: 03/17/2014 Elsevier Interactive Patient Education  2017 [employer].

## 2020-07-10 NOTE — Progress Notes (Signed)
Subjective:   Cynthia [name] is a 87 y.o. female who presents for Medicare Annual (Subsequent) preventive examination.  Review of Systems    No ROS.  Medicare Wellness Virtual Visit.  Visual/audio telehealth visit, UTA vital signs.   See social history for additional risk factors.   Cardiac Risk Factors include: advanced age (>61men, >39 women);hypertension     Objective:    Today's Vitals   07/10/20 1121  Weight: 141 lb (64 kg)  Height: 5\' 3"  (1.6 m)   Body mass index is 24.98 kg/m.  Advanced Directives 07/10/2020 07/04/2019 07/01/2018 06/23/2017 05/20/2016  Does Patient Have a Medical Advance Directive? Yes Yes Yes Yes Yes  Type of Paramedic of [place];Living will [place];Living will Living will [place];Living will Living will;Healthcare Power of Attorney  Does patient want to make changes to medical advance directive? No - Patient declined No - Patient declined No - Patient declined No - Patient declined No - Patient declined  Copy of [place] in Chart? No - copy requested No - copy requested - No - copy requested No - copy requested    Current Medications (verified) Outpatient Encounter Medications as of 07/10/2020  Medication Sig  . ACCU-CHEK SOFTCLIX LANCETS lancets USE ONCE A DAY  . amLODipine (NORVASC) 10 MG tablet Take 1 tablet (10 mg total) by mouth daily.  Marland Kitchen aspirin 81 MG tablet Take 81 mg by mouth daily.  Marland Kitchen atorvastatin (LIPITOR) 20 MG tablet Take 1 tablet (20 mg total) by mouth daily.  . Calcium Carbonate-Vitamin D 600-400 MG-UNIT tablet Take 1 tablet by mouth 2 (two) times daily.  Marland Kitchen CRANBERRY PO Take by mouth daily.  Marland Kitchen glucose blood (ACCU-CHEK AVIVA PLUS) test strip 1 EACH BY OTHER ROUTE AS NEEDED FOR OTHER. USE AS INSTRUCTED TO CHECK BLOOD SUGAR ONCE DAILY.DX E11.9  . hydrochlorothiazide (HYDRODIURIL) 25 MG tablet Take 1 tablet (25 mg total) by mouth daily.  . Multiple  Vitamins-Minerals (ICAPS AREDS 2) CAPS Take 1 capsule by mouth 2 (two) times daily.  . Multiple Vitamins-Minerals (PRESERVISION AREDS 2+MULTI VIT PO) Take by mouth.  . potassium chloride (KLOR-CON) 10 MEQ tablet TAKE 1 TABLET BY MOUTH  DAILY  . ramipril (ALTACE) 10 MG capsule Take 1 capsule (10 mg total) by mouth daily.   No facility-administered encounter medications on file as of 07/10/2020.    Allergies (verified) No known drug allergy   History: Past Medical History:  Diagnosis Date  . Allergic rhinitis   . Breast cancer ([place]) 2014   RT LUMPECTOMY  . 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 Resected margins negative. Whole breast radiation  . Cancer ([place]) 12-15-12   left upper arm, malignant melanoma Clark level II, Breslow thickness: 0.35 mm.  . Diabetes mellitus ([place])   . Hypercholesterolemia   . Hypertension   . Myasthenia gravis ([place])    ocular  . Personal history of radiation therapy 2014   Rt. Breast  . S/P radiation therapy 2015   BREAST CA   Past Surgical History:  Procedure Laterality Date  . BREAST BIOPSY Right 2014   DCIS  . BREAST LUMPECTOMY Right 2014   Lumpectomy  . BREAST SURGERY Right 2014   lumpectomy  . EYE SURGERY     Caterac both eyes   Family History  Problem Relation Age of Onset  . Skin cancer Father   . Cancer Father  colon  . Colon cancer Brother   . Cancer Brother        colon  . Breast cancer Other        niece  . Skin cancer Sister    Social History   Socioeconomic History  . Marital status: Divorced    Spouse name: Not on file  . Number of children: Not on file  . Years of education: Not on file  . Highest education level: Not on file  Occupational History  . Not on file  Tobacco Use  . Smoking status: Never Smoker  . Smokeless tobacco: Never Used  Substance and Sexual Activity  . Alcohol use: No    Alcohol/week: 0.0 standard drinks  . Drug use: No  . Sexual activity: Never  Other Topics  Concern  . Not on file  Social History Narrative  . Not on file   Social Determinants of Health   Financial Resource Strain: Low Risk   . Difficulty of Paying Living Expenses: Not hard at all  Food Insecurity: No Food Insecurity  . Worried About Charity fundraiser in the Last Year: Never true  . Ran Out of Food in the Last Year: Never true  Transportation Needs: No Transportation Needs  . Lack of Transportation (Medical): No  . Lack of Transportation (Non-Medical): No  Physical Activity: Unknown  . Days of Exercise per Week: 0 days  . Minutes of Exercise per Session: Not on file  Stress: Not on file  Social Connections: Unknown  . Frequency of Communication with Friends and Family: More than three times a week  . Frequency of Social Gatherings with Friends and Family: More than three times a week  . Attends Religious Services: Not on file  . Active Member of Clubs or Organizations: Not on file  . Attends Archivist Meetings: Not on file  . Marital Status: Not on file    Tobacco Counseling Counseling given: Not Answered   Clinical Intake:  Pre-visit preparation completed: Yes        Diabetes: No  How often do you need to have someone help you when you read instructions, pamphlets, or other written materials from your doctor or pharmacy?: 1 - Never    Interpreter Needed?: No      Activities of Daily Living In your present state of health, do you have any difficulty performing the following activities: 07/10/2020  Hearing? N  Vision? N  Difficulty concentrating or making decisions? N  Walking or climbing stairs? N  Dressing or bathing? N  Doing errands, shopping? N  Preparing Food and eating ? N  Using the Toilet? N  In the past six months, have you accidently leaked urine? N  Do you have problems with loss of bowel control? N  Managing your Medications? N  Managing your Finances? N  Housekeeping or managing your Housekeeping? N  Some recent  data might be hidden    Patient Care Team: Einar Pheasant, MD as PCP - General (Internal Medicine) Bary Castilla, Forest Gleason, MD (General Surgery) Einar Pheasant, MD (Internal Medicine) Barrie Dunker, MD (Dermatology)  Indicate any recent Medical Services you may have received from other than [facility] providers in the past year (date may be approximate).     Assessment:   This is a routine wellness examination for [place].  I connected with [place] today by telephone and verified that I am speaking with the correct person using two identifiers. Location patient: home Location provider: work Persons  participating in the virtual visit: patient, nurse.    I discussed the limitations, risks, security and privacy concerns of performing an evaluation and management service by telephone and the availability of in person appointments. The patient expressed understanding and verbally consented to this telephonic visit.    Interactive audio and video telecommunications were attempted between this provider and patient, however failed, due to patient having technical difficulties OR patient did not have access to video capability.  We continued and completed visit with audio only.  Some vital signs may be absent or patient reported.   Hearing/Vision screen  Hearing Screening   125Hz  250Hz  500Hz  1000Hz  2000Hz  3000Hz  4000Hz  6000Hz  8000Hz   Right ear:           Left ear:           Comments: Patient is able to hear conversational tones without difficulty. No issues reported.   Vision Screening Comments: Followed by [facility] (Dr. Edison Pace)  Wears corrective lenses  Visual acuity not assessed per patient preference since they have regular follow up with the ophthalmologist  Dietary issues and exercise activities discussed: Current Exercise Habits: Home exercise routine, Intensity: Mild  Regular diet Good water intake  Goals Addressed              This Visit's Progress      Patient Stated   .  Good Water Intake (pt-stated)        Monitor diet      Depression Screen PHQ 2/9 Scores 07/10/2020 12/06/2019 07/06/2019 07/04/2019 02/28/2019 01/24/2019 09/30/2018  PHQ - 2 Score 0 0 0 1 0 0 0  PHQ- 9 Score - 0 0 - 0 - 2    Fall Risk Fall Risk  07/10/2020 07/04/2019 01/24/2019 07/01/2018 01/26/2018  Falls in the past year? 0 0 0 0 0  Number falls in past yr: 0 - 0 - -  Injury with Fall? 0 - - - -  Risk for fall due to : - - - - -  Follow up Falls evaluation completed Falls evaluation completed Falls evaluation completed - Falls evaluation completed    FALL RISK PREVENTION PERTAINING TO THE HOME: Handrails in use when climbing stairs? Yes Home free of loose throw rugs in walkways, pet beds, electrical cords, etc? Yes  Adequate lighting in your home to reduce risk of falls? Yes   ASSISTIVE DEVICES UTILIZED TO PREVENT FALLS: Life alert? No  Use of a cane, walker or w/c? No   TIMED UP AND GO: Was the test performed? No .   Cognitive Function:   Patient is alert and oriented x3.    6CIT Screen 07/10/2020 07/04/2019 07/01/2018 06/23/2017 05/20/2016  What Year? 0 points - 0 points 0 points 0 points  What month? 0 points - 0 points 0 points 0 points  What time? 0 points - 0 points 0 points 0 points  Count back from 20 0 points - 0 points 0 points 0 points  Months in reverse 0 points 0 points 0 points 0 points 0 points  Repeat phrase 0 points - 0 points 0 points 0 points  Total Score 0 - 0 0 0    Immunizations Immunization History  Administered Date(s) Administered  . Fluad Quad(high Dose 65+) 12/06/2019  . Influenza Split 12/20/2011  . Influenza, High Dose Seasonal PF 12/15/2016, 01/12/2018, 12/25/2018  . Influenza,inj,Quad PF,6+ Mos 11/08/2012, 11/23/2013, 12/14/2014  . Influenza-Unspecified 12/14/2014, 12/10/2015  . PFIZER(Purple Top)SARS-COV-2 Vaccination 04/24/2019, 05/16/2019, 11/30/2019, 07/03/2020  . Pneumococcal  Conjugate-13 11/23/2013   TDAP status: Due,  Education has been provided regarding the importance of this vaccine. Advised may receive this vaccine at local pharmacy or Health Dept. Aware to provide a copy of the vaccination record if obtained from local pharmacy or Health Dept. Verbalized acceptance and understanding.  Deferred.  PNA vaccine- deferred for cpe.   Health Maintenance Health Maintenance  Topic Date Due  . PNA vac Low Risk Adult (2 of 2 - PPSV23) 08/14/2020 (Originally 11/24/2014)  . TETANUS/TDAP  07/10/2021 (Originally 11/07/1950)  . INFLUENZA VACCINE  09/24/2020  . HEMOGLOBIN A1C  10/08/2020  . MAMMOGRAM  01/16/2021  . FOOT EXAM  04/10/2021  . OPHTHALMOLOGY EXAM  05/28/2021  . DEXA SCAN  Completed  . COVID-19 Vaccine  Completed  . HPV VACCINES  Aged Out   Colorectal cancer screening: No longer required.    Mammogram status: Completed 01/17/20. Repeat every year  Lung Cancer Screening: (Low Dose CT Chest recommended if Age 40-80 years, 30 pack-year currently smoking OR have quit w/in 15years.) does not qualify.   Vision Screening: Recommended annual ophthalmology exams for early detection of glaucoma and other disorders of the eye. Is the patient up to date with their annual eye exam?  Yes   Dental Screening: Recommended annual dental exams for proper oral hygiene.  Community Resource Referral / Chronic Care Management: CRR required this visit?  No   CCM required this visit?  No      Plan:    Keep all routine maintenance appointments.   Cpe 08/14/20 @ 2:00  I have personally reviewed and noted the following in the patient's chart:   . Medical and social history . Use of alcohol, tobacco or illicit drugs  . Current medications and supplements including opioid prescriptions. Patient does not take opioids.  . Functional ability and status . Nutritional status . Physical activity . Advanced directives . List of other physicians . Hospitalizations, surgeries, and ER visits in previous 12  months . Vitals . Screenings to include cognitive, depression, and falls . Referrals and appointments  In addition, I have reviewed and discussed with patient certain preventive protocols, quality metrics, and best practice recommendations. A written personalized care plan for preventive services as well as general preventive health recommendations were provided to patient.     Varney Biles, LPN   [id number]

## 2020-08-14 ENCOUNTER — Other Ambulatory Visit: Payer: Self-pay

## 2020-08-14 ENCOUNTER — Other Ambulatory Visit: Payer: Self-pay | Admitting: Internal Medicine

## 2020-08-14 ENCOUNTER — Ambulatory Visit (INDEPENDENT_AMBULATORY_CARE_PROVIDER_SITE_OTHER): Payer: Medicare Other | Admitting: Internal Medicine

## 2020-08-14 VITALS — BP 122/60 | HR 71 | Temp 97.8°F | Resp 16 | Ht 63.0 in | Wt 142.2 lb

## 2020-08-14 DIAGNOSIS — G7000 Myasthenia gravis without (acute) exacerbation: Secondary | ICD-10-CM | POA: Diagnosis not present

## 2020-08-14 DIAGNOSIS — I10 Essential (primary) hypertension: Secondary | ICD-10-CM | POA: Diagnosis not present

## 2020-08-14 DIAGNOSIS — E7800 Pure hypercholesterolemia, unspecified: Secondary | ICD-10-CM | POA: Diagnosis not present

## 2020-08-14 DIAGNOSIS — E1165 Type 2 diabetes mellitus with hyperglycemia: Secondary | ICD-10-CM

## 2020-08-14 DIAGNOSIS — W19XXXA Unspecified fall, initial encounter: Secondary | ICD-10-CM

## 2020-08-14 DIAGNOSIS — F320 Major depressive disorder, single episode, mild: Secondary | ICD-10-CM

## 2020-08-14 DIAGNOSIS — D0511 Intraductal carcinoma in situ of right breast: Secondary | ICD-10-CM

## 2020-08-14 DIAGNOSIS — Z85820 Personal history of malignant melanoma of skin: Secondary | ICD-10-CM

## 2020-08-14 DIAGNOSIS — F32A Depression, unspecified: Secondary | ICD-10-CM

## 2020-08-14 MED ORDER — TRAZODONE HCL 50 MG PO TABS: 50.0000 mg | ORAL_TABLET | Freq: Every evening | ORAL | 0 refills | Status: DC | PRN

## 2020-08-14 NOTE — Progress Notes (Signed)
Patient ID: Cynthia [name], female   DOB: 1931-10-18, 87 y.o.   MRN: [id number]   Subjective:    Patient ID: Cynthia [name], female    DOB: Apr 19, 1931, 87 y.o.   MRN: [id number]  HPI This visit occurred during the SARS-CoV-2 public health emergency.  Safety protocols were in place, including screening questions prior to the visit, additional usage of staff PPE, and extensive cleaning of exam room while observing appropriate contact time as indicated for disinfecting solutions.   Patient here for a scheduled follow up. Here to follow up regarding her blood sugar, cholesterol and blood pressure.  She is doing relatively well.  Did fall two weeks ago.  Was standing on a stool and lost her balance.  Hit her face and had a bruise on her hip.  No residual problems.  No headache.  No dizziness.  No pain with walking or moving.  No chest pain reported.  Breathing stable.  No increased cough or congestion.  No increased abdominal pain reported.  No bowel issues reported.  No sick contacts.  No fever.  No nausea or vomiting. States am sugars are in the 120s.     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 Resected margins negative. Whole breast radiation   Cancer ([place]) 12-15-12   left upper arm, malignant melanoma Clark level II, Breslow thickness: 0.35 mm.   Diabetes mellitus ([place])    Hypercholesterolemia    Hypertension    Myasthenia gravis ([place])    ocular   Personal history of radiation therapy 2014   Rt. Breast   S/P radiation therapy 2015   BREAST CA   Past Surgical History:  Procedure Laterality Date   BREAST BIOPSY Right 2014   DCIS   BREAST LUMPECTOMY Right 2014   Lumpectomy   BREAST SURGERY Right 2014   lumpectomy   EYE SURGERY     Caterac both eyes   Family History  Problem Relation Age of Onset   Skin cancer Father    Cancer Father        colon    Colon cancer Brother    Cancer Brother        colon   Breast cancer Other        niece   Skin cancer Sister    Social History   Socioeconomic History   Marital status: Divorced    Spouse name: Not on file   Number of children: Not on file   Years of education: Not on file   Highest education level: Not on file  Occupational History   Not on file  Tobacco Use   Smoking status: Never   Smokeless tobacco: Never  Substance and Sexual Activity   Alcohol use: No    Alcohol/week: 0.0 standard drinks   Drug use: No   Sexual activity: Never  Other Topics Concern   Not on file  Social History Narrative   Not on file   Social Determinants of Health   Financial Resource Strain: Low Risk    Difficulty of Paying Living Expenses: Not hard at all  Food Insecurity: No Food Insecurity   Worried About Charity fundraiser in the Last Year: Never true   [place] in the Last Year: Never true  Transportation Needs: No Transportation Needs   Lack of  Transportation (Medical): No   Lack of Transportation (Non-Medical): No  Physical Activity: Unknown   Days of Exercise per Week: 0 days   Minutes of Exercise per Session: Not on file  Stress: Not on file  Social Connections: Unknown   Frequency of Communication with Friends and Family: More than three times a week   Frequency of Social Gatherings with Friends and Family: More than three times a week   Attends Religious Services: Not on file   Active Member of Clubs or Organizations: Not on file   Attends Archivist Meetings: Not on file   Marital Status: Not on file    Outpatient Encounter Medications as of 08/14/2020  Medication Sig   traZODone (DESYREL) 50 MG tablet Take 1 tablet (50 mg total) by mouth at bedtime as needed for sleep.   ACCU-CHEK SOFTCLIX LANCETS lancets USE ONCE A DAY   aspirin 81 MG tablet Take 81 mg by mouth daily.   atorvastatin (LIPITOR) 20 MG tablet Take 1 tablet (20 mg total) by mouth daily.    Calcium Carbonate-Vitamin D 600-400 MG-UNIT tablet Take 1 tablet by mouth 2 (two) times daily.   CRANBERRY PO Take by mouth daily.   glucose blood (ACCU-CHEK AVIVA PLUS) test strip 1 EACH BY OTHER ROUTE AS NEEDED FOR OTHER. USE AS INSTRUCTED TO CHECK BLOOD SUGAR ONCE DAILY.DX E11.9   Multiple Vitamins-Minerals (ICAPS AREDS 2) CAPS Take 1 capsule by mouth 2 (two) times daily.   Multiple Vitamins-Minerals (PRESERVISION AREDS 2+MULTI VIT PO) Take by mouth.   potassium chloride (KLOR-CON) 10 MEQ tablet TAKE 1 TABLET BY MOUTH  DAILY   ramipril (ALTACE) 10 MG capsule Take 1 capsule (10 mg total) by mouth daily.   [DISCONTINUED] amLODipine (NORVASC) 10 MG tablet TAKE 1 TABLET BY MOUTH  DAILY   [DISCONTINUED] hydrochlorothiazide (HYDRODIURIL) 25 MG tablet TAKE 1 TABLET BY MOUTH  DAILY   No facility-administered encounter medications on file as of 08/14/2020.     Review of Systems  Constitutional:  Negative for appetite change and unexpected weight change.  HENT:  Negative for congestion and sinus pressure.   Respiratory:  Negative for cough, chest tightness and shortness of breath.   Cardiovascular:  Negative for chest pain, palpitations and leg swelling.  Gastrointestinal:  Negative for abdominal pain, diarrhea, nausea and vomiting.  Genitourinary:  Negative for difficulty urinating and dysuria.  Musculoskeletal:  Negative for joint swelling and myalgias.  Skin:  Negative for color change and rash.  Neurological:  Negative for dizziness, light-headedness and headaches.  Psychiatric/Behavioral:  Negative for agitation and dysphoric mood.       Objective:    Physical Exam Vitals reviewed.  Constitutional:      General: She is not in acute distress.    Appearance: Normal appearance.  HENT:     Head: Normocephalic and atraumatic.     Right Ear: External ear normal.     Left Ear: External ear normal.  Eyes:     General: No scleral icterus.       Right eye: No discharge.        Left eye: No  discharge.     Conjunctiva/sclera: Conjunctivae normal.  Neck:     Thyroid: No thyromegaly.  Cardiovascular:     Rate and Rhythm: Normal rate and regular rhythm.  Pulmonary:     Effort: No respiratory distress.     Breath sounds: Normal breath sounds. No wheezing.  Abdominal:     General: Bowel sounds are normal.  Palpations: Abdomen is soft.     Tenderness: There is no abdominal tenderness.  Musculoskeletal:        General: No swelling or tenderness.     Cervical back: Neck supple. No tenderness.  Lymphadenopathy:     Cervical: No cervical adenopathy.  Skin:    Findings: No erythema or rash.  Neurological:     Mental Status: She is alert.  Psychiatric:        Mood and Affect: Mood normal.        Behavior: Behavior normal.    BP 122/60   Pulse 71   Temp 97.8 F (36.6 C)   Resp 16   Ht _0  (1.6 m)   Wt 142 lb 3.2 oz (64.5 kg)   SpO2 98%   BMI 25.19 kg/m  Wt Readings from Last 3 Encounters:  08/14/20 142 lb 3.2 oz (64.5 kg)  07/10/20 141 lb (64 kg)  04/10/20 141 lb (64 kg)     Lab Results  Component Value Date   WBC 7.5 04/10/2020   HGB 12.8 04/10/2020   HCT 38.7 04/10/2020   PLT 227.0 04/10/2020   GLUCOSE 116 (H) 04/10/2020   CHOL 145 04/10/2020   TRIG 111.0 04/10/2020   HDL 44.80 04/10/2020   LDLCALC 78 04/10/2020   ALT 12 04/10/2020   AST 14 04/10/2020   NA 139 04/10/2020   K 3.7 04/10/2020   CL 100 04/10/2020   CREATININE 0.94 04/10/2020   BUN 17 04/10/2020   CO2 32 04/10/2020   TSH 0.97 04/10/2020   HGBA1C 7.6 (H) 04/10/2020   MICROALBUR 2.4 (H) 07/06/2019    MM 3D SCREEN BREAST BILATERAL  Result Date: 01/21/2020 CLINICAL DATA:  Screening. History of RIGHT breast cancer in 2014 status post lumpectomy and radiation therapy. EXAM: DIGITAL SCREENING BILATERAL MAMMOGRAM WITH TOMO AND CAD COMPARISON:  Previous exam(s). ACR Breast Density Category c: The breast tissue is heterogeneously dense, which may obscure small masses. FINDINGS: There are  no findings suspicious for malignancy. Images were processed with CAD. IMPRESSION: No mammographic evidence of malignancy. A result letter of this screening mammogram will be mailed directly to the patient. RECOMMENDATION: Screening mammogram in one year. (Code:SM-B-01Y) BI-RADS CATEGORY  1: Negative. Electronically Signed   By: Franki Cabot M.D.   On: 01/21/2020 09:40       Assessment & Plan:   Problem List Items Addressed This Visit     DCIS (ductal carcinoma in situ) of breast    Followed by Dr Bary Castilla as outlined.  Mammogram 12/2018 - [facility].        Diabetes mellitus ([place]) - Primary    Low carb diet and exercise.  Sugars as outlined.  Follow met b and a1c.        Relevant Orders   Hemoglobin A1c   Fall    Recent fall as outlined.  No residual pain or problems.  Discussed not standing on stool, etc.  Follow.         History of melanoma    Followed by dermatology.         Hypercholesterolemia    On lipitor.  Low cholesterol diet and exercise.  Follow lipid panel and liver function tests.         Relevant Orders   Basic metabolic panel   Hepatic function panel   Lipid panel   Hypertension    Blood pressure doing well on amlodipine, ramipril and hctz.  Follow pressures.  Follow metabolic panel.  Mild depression (HCC)    Overall appears to be doing better.  Follow.         Relevant Medications   traZODone (DESYREL) 50 MG tablet   Myasthenia gravis ([place])    Has been followed by Dr Manuella Ghazi.  Stable.        Relevant Medications   traZODone (DESYREL) 50 MG tablet     Einar Pheasant, MD

## 2020-08-16 ENCOUNTER — Other Ambulatory Visit: Payer: Self-pay | Admitting: Internal Medicine

## 2020-08-20 ENCOUNTER — Other Ambulatory Visit (INDEPENDENT_AMBULATORY_CARE_PROVIDER_SITE_OTHER): Payer: Medicare Other

## 2020-08-20 ENCOUNTER — Encounter: Payer: Self-pay | Admitting: Internal Medicine

## 2020-08-20 ENCOUNTER — Other Ambulatory Visit: Payer: Self-pay

## 2020-08-20 DIAGNOSIS — E1165 Type 2 diabetes mellitus with hyperglycemia: Secondary | ICD-10-CM | POA: Diagnosis not present

## 2020-08-20 DIAGNOSIS — E7800 Pure hypercholesterolemia, unspecified: Secondary | ICD-10-CM

## 2020-08-20 DIAGNOSIS — W19XXXA Unspecified fall, initial encounter: Secondary | ICD-10-CM | POA: Insufficient documentation

## 2020-08-20 LAB — BASIC METABOLIC PANEL
BUN: 18 mg/dL (ref 6–23)
CO2: 29 mEq/L (ref 19–32)
Calcium: 10.5 mg/dL (ref 8.4–10.5)
Chloride: 100 mEq/L (ref 96–112)
Creatinine, Ser: 1.11 mg/dL (ref 0.40–1.20)
GFR: 44.92 mL/min — ABNORMAL LOW (ref >60.00)
Glucose, Bld: 134 mg/dL — ABNORMAL HIGH (ref 70–99)
Potassium: 4 mEq/L (ref 3.5–5.1)
Sodium: 137 mEq/L (ref 135–145)

## 2020-08-20 LAB — HEPATIC FUNCTION PANEL
ALT: 12 U/L (ref 0–35)
AST: 16 U/L (ref 0–37)
Albumin: 4 g/dL (ref 3.5–5.2)
Alkaline Phosphatase: 85 U/L (ref 39–117)
Bilirubin, Direct: 0.1 mg/dL (ref 0.0–0.3)
Total Bilirubin: 0.7 mg/dL (ref 0.2–1.2)
Total Protein: 6.7 g/dL (ref 6.0–8.3)

## 2020-08-20 LAB — LIPID PANEL
Cholesterol: 144 mg/dL (ref 0–200)
HDL: 43 mg/dL (ref >39.00)
LDL Cholesterol: 78 mg/dL (ref 0–99)
NonHDL: 101.09
Total CHOL/HDL Ratio: 3
Triglycerides: 113 mg/dL (ref 0.0–149.0)
VLDL: 22.6 mg/dL (ref 0.0–40.0)

## 2020-08-20 LAB — HEMOGLOBIN A1C: Hgb A1c MFr Bld: 7.7 % — ABNORMAL HIGH (ref 4.6–6.5)

## 2020-08-20 NOTE — Assessment & Plan Note (Signed)
Followed by Dr Bary Castilla as outlined.  Mammogram 12/2018 - [facility] I.

## 2020-08-20 NOTE — Assessment & Plan Note (Signed)
Blood pressure doing well on amlodipine, ramipril and hctz.  Follow pressures.  Follow metabolic panel.

## 2020-08-20 NOTE — Assessment & Plan Note (Signed)
Has been followed by Dr Shah.  Stable.  

## 2020-08-20 NOTE — Assessment & Plan Note (Signed)
Recent fall as outlined.  No residual pain or problems.  Discussed not standing on stool, etc.  Follow.

## 2020-08-20 NOTE — Assessment & Plan Note (Signed)
Overall appears to be doing better.  Follow.  

## 2020-08-20 NOTE — Assessment & Plan Note (Signed)
Low carb diet and exercise.  Sugars as outlined.  Follow met b and a1c.   

## 2020-08-20 NOTE — Assessment & Plan Note (Signed)
On lipitor.  Low cholesterol diet and exercise.  Follow lipid panel and liver function tests.   

## 2020-08-20 NOTE — Assessment & Plan Note (Signed)
Followed by dermatology

## 2020-08-21 ENCOUNTER — Other Ambulatory Visit: Payer: Self-pay | Admitting: Internal Medicine

## 2020-08-21 ENCOUNTER — Telehealth: Payer: Self-pay | Admitting: Internal Medicine

## 2020-08-21 DIAGNOSIS — R944 Abnormal results of kidney function studies: Secondary | ICD-10-CM

## 2020-08-21 NOTE — Progress Notes (Signed)
Order placed for f/u met b 

## 2020-08-21 NOTE — Telephone Encounter (Signed)
Patient called Cynthia [name] back and wanted to ask her another question.

## 2020-08-21 NOTE — Telephone Encounter (Signed)
Pt was calling to ask if she needed to fast for f/u labs. Advised that she did not need to do so.

## 2020-09-20 ENCOUNTER — Other Ambulatory Visit: Payer: Self-pay

## 2020-09-20 ENCOUNTER — Other Ambulatory Visit (INDEPENDENT_AMBULATORY_CARE_PROVIDER_SITE_OTHER): Payer: Medicare Other

## 2020-09-20 DIAGNOSIS — R944 Abnormal results of kidney function studies: Secondary | ICD-10-CM

## 2020-09-20 LAB — BASIC METABOLIC PANEL
BUN: 13 mg/dL (ref 6–23)
CO2: 29 mEq/L (ref 19–32)
Calcium: 10.1 mg/dL (ref 8.4–10.5)
Chloride: 97 mEq/L (ref 96–112)
Creatinine, Ser: 0.93 mg/dL (ref 0.40–1.20)
GFR: 55.51 mL/min — ABNORMAL LOW (ref >60.00)
Glucose, Bld: 125 mg/dL — ABNORMAL HIGH (ref 70–99)
Potassium: 3.7 mEq/L (ref 3.5–5.1)
Sodium: 134 mEq/L — ABNORMAL LOW (ref 135–145)

## 2020-09-21 ENCOUNTER — Other Ambulatory Visit: Payer: Self-pay | Admitting: Internal Medicine

## 2020-09-21 DIAGNOSIS — R7989 Other specified abnormal findings of blood chemistry: Secondary | ICD-10-CM

## 2020-09-21 DIAGNOSIS — R944 Abnormal results of kidney function studies: Secondary | ICD-10-CM

## 2020-09-21 NOTE — Progress Notes (Signed)
Order placed for f/u lab.   

## 2020-09-24 ENCOUNTER — Telehealth: Payer: Self-pay | Admitting: Internal Medicine

## 2020-09-24 NOTE — Telephone Encounter (Signed)
Pt spoke with Leta Speller for Labs. See recent result note.

## 2020-09-24 NOTE — Telephone Encounter (Signed)
Patient is returning your call about her lab results.

## 2020-09-26 ENCOUNTER — Other Ambulatory Visit: Payer: Self-pay | Admitting: Internal Medicine

## 2020-10-10 ENCOUNTER — Telehealth: Payer: Self-pay | Admitting: Internal Medicine

## 2020-10-10 NOTE — Telephone Encounter (Signed)
Spoken to patient, she went to have a rapid test and PCR test. The Rapid Test was positive today. Patient is currently having sx of runny nose, sweatiness and a slight sore throat. Sx started 10-03-20.

## 2020-10-10 NOTE — Telephone Encounter (Signed)
Patient tested positive for COVID today and is requesting the antiviral medication. Symptoms are running nose, little sore throat.

## 2020-10-11 ENCOUNTER — Telehealth (INDEPENDENT_AMBULATORY_CARE_PROVIDER_SITE_OTHER): Payer: Medicare Other | Admitting: Internal Medicine

## 2020-10-11 ENCOUNTER — Encounter: Payer: Self-pay | Admitting: Internal Medicine

## 2020-10-11 DIAGNOSIS — I10 Essential (primary) hypertension: Secondary | ICD-10-CM | POA: Diagnosis not present

## 2020-10-11 DIAGNOSIS — U071 COVID-19: Secondary | ICD-10-CM | POA: Diagnosis not present

## 2020-10-11 DIAGNOSIS — E1165 Type 2 diabetes mellitus with hyperglycemia: Secondary | ICD-10-CM | POA: Diagnosis not present

## 2020-10-11 NOTE — Progress Notes (Signed)
Patient ID: Cynthia [name], female   DOB: 1931/05/18, 87 y.o.   MRN: [id number]   Virtual Visit via video Note  This visit type was conducted due to national recommendations for restrictions regarding the COVID-19 pandemic (e.g. social distancing).  This format is felt to be most appropriate for this patient at this time.  All issues noted in this document were discussed and addressed.  No physical exam was performed (except for noted visual exam findings with Video Visits).   I connected with [place] by telephone and verified that I am speaking with the correct person using two identifiers. Location patient: home Location provider: work  Persons participating in the telephone visit: patient, provider  The limitations, risks, security and privacy concerns of performing an evaluation and management service by telephone and the availability of in person appointments have been discussed.  It has also been discussed with the patient that there may be a patient responsible charge related to this service. The patient expressed understanding and agreed to proceed.   Reason for visit: work in appt  HPI: Work in - covid positive.  Reports that 1 week ago she spent the night with her sister.  Slipped on her fan.  Did not use cover.  Noted she had a little runny nose the next day.  No fever.  No sinus pressure.  She subsequently found out that her sister tested positive for COVID.  She subsequently tested and also tested positive.  Very minimal congestion.  States when she eats she notices some congestion.  The runny nose as outlined.  No sore throat.  No chest congestion, chest pain or chest tightness.  No shortness of breath.  No nausea or vomiting.  Blood pressures doing well.  According to the above symptoms would have began on 10 August.  She is out of the window for oral antivirals.   ROS: See pertinent positives and negatives per HPI.  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 Resected margins negative. Whole breast radiation   Cancer ([place]) 12-15-12   left upper arm, malignant melanoma Clark level II, Breslow thickness: 0.35 mm.   Diabetes mellitus ([place])    Hypercholesterolemia    Hypertension    Myasthenia gravis ([place])    ocular   Personal history of radiation therapy 2014   Rt. Breast   S/P radiation therapy 2015   BREAST CA    Past Surgical History:  Procedure Laterality Date   BREAST BIOPSY Right 2014   DCIS   BREAST LUMPECTOMY Right 2014   Lumpectomy   BREAST SURGERY Right 2014   lumpectomy   EYE SURGERY     Caterac both eyes    Family History  Problem Relation Age of Onset   Skin cancer Father    Cancer Father        colon   Colon cancer Brother    Cancer Brother        colon   Breast cancer Other        niece   Skin cancer Sister     SOCIAL HX: reviewed.    Current Outpatient Medications:    ACCU-CHEK SOFTCLIX LANCETS lancets, USE ONCE A DAY, Disp: 100 each, Rfl: 3   amLODipine (NORVASC) 10 MG tablet, TAKE 1 TABLET BY MOUTH  DAILY, Disp: 90 tablet, Rfl: 3  aspirin 81 MG tablet, Take 81 mg by mouth daily., Disp: , Rfl:    atorvastatin (LIPITOR) 20 MG tablet, Take 1 tablet (20 mg total) by mouth daily., Disp: 90 tablet, Rfl: 2   Calcium Carbonate-Vitamin D 600-400 MG-UNIT tablet, Take 1 tablet by mouth 2 (two) times daily., Disp: , Rfl:    CRANBERRY PO, Take by mouth daily., Disp: , Rfl:    glucose blood (ACCU-CHEK AVIVA PLUS) test strip, 1 EACH BY OTHER ROUTE AS NEEDED FOR OTHER. USE AS INSTRUCTED TO CHECK BLOOD SUGAR ONCE DAILY.DX E11.9, Disp: 100 strip, Rfl: 3   hydrochlorothiazide (HYDRODIURIL) 25 MG tablet, TAKE 1 TABLET BY MOUTH  DAILY, Disp: 90 tablet, Rfl: 3   Multiple Vitamins-Minerals (ICAPS AREDS 2) CAPS, Take 1 capsule by mouth 2 (two) times daily., Disp: , Rfl:    Multiple Vitamins-Minerals (PRESERVISION AREDS  2+MULTI VIT PO), Take by mouth., Disp: , Rfl:    potassium chloride (KLOR-CON) 10 MEQ tablet, TAKE 1 TABLET BY MOUTH  DAILY, Disp: 90 tablet, Rfl: 3   ramipril (ALTACE) 10 MG capsule, TAKE 1 CAPSULE BY MOUTH  DAILY, Disp: 90 capsule, Rfl: 3   traZODone (DESYREL) 50 MG tablet, TAKE 1 TABLET BY MOUTH AT  BEDTIME AS NEEDED FOR SLEEP, Disp: 90 tablet, Rfl: 3  EXAM:  GENERAL: alert. Sounds to be in no acute distress.  Answering questions appropriately.   PSYCH/NEURO: pleasant and cooperative, no obvious depression or anxiety, speech and thought processing grossly intact  ASSESSMENT AND PLAN:  Discussed the following assessment and plan:  Problem List Items Addressed This Visit     COVID-19 virus infection    Recently tested positive.  Symptoms began around 07/03/2020.  Out of the window for oral antivirals.  She is having very minimal symptoms.  Some minimal runny nose and some congestion when she eats.  Discussed Robitussin/Mucinex.  Also discussed steroid nasal spray.  No shortness of breath.  No chest pain.  Rest.  Fluids.  Discussed quarantine guidelines.  Call with update.      Diabetes mellitus (HCC)    Not checking sugars.  Follow [facility] and A1c.  Low-carb diet and exercise.      Hypertension    Reviewed outside blood pressure readings.  Most range between 120s to 130s/50s to 60s.  Continue amlodipine, ramipril and HCTZ.  Continue to follow pressures.         Return if symptoms worsen or fail to improve.   I discussed the assessment and treatment plan with the patient. The patient was provided an opportunity to ask questions and all were answered. The patient agreed with the plan and demonstrated an understanding of the instructions.   The patient was advised to call back or seek an in-person evaluation if the symptoms worsen or if the condition fails to improve as anticipated.  I provided 23 minutes of non-face-to-face time during this encounter.   Einar Pheasant, MD

## 2020-10-11 NOTE — Telephone Encounter (Signed)
The message states her symptoms started 10/03/20. If this is correct, she is out of the window for oral antivirals.  Please confirm.  Also, confirm how she is doing.  I do not mind working her in to discuss covid.

## 2020-10-11 NOTE — Telephone Encounter (Signed)
Due to scheduling conflict, patient has been scheduled for 4:00 today to discuss.

## 2020-10-11 NOTE — Telephone Encounter (Signed)
If doing ok and has questions, I can work her in virtual visit tomorrow.  Any acute issues, she will need to be seen previously

## 2020-10-11 NOTE — Telephone Encounter (Signed)
Patient is doing ok overall, she has a slight sore throat and runny nose. The 10th is the correct date. I already told her about the window but she still wanted to try and get it. I do feel she needs to discuss covid with you due to multiple questions.

## 2020-10-15 ENCOUNTER — Other Ambulatory Visit: Payer: Self-pay | Admitting: Internal Medicine

## 2020-10-15 DIAGNOSIS — U071 COVID-19: Secondary | ICD-10-CM | POA: Insufficient documentation

## 2020-10-15 NOTE — Assessment & Plan Note (Signed)
Reviewed outside blood pressure readings.  Most range between 120s to 130s/50s to 60s.  Continue amlodipine, ramipril and HCTZ.  Continue to follow pressures.

## 2020-10-15 NOTE — Assessment & Plan Note (Signed)
Recently tested positive.  Symptoms began around 07/03/2020.  Out of the window for oral antivirals.  She is having very minimal symptoms.  Some minimal runny nose and some congestion when she eats.  Discussed Robitussin/Mucinex.  Also discussed steroid nasal spray.  No shortness of breath.  No chest pain.  Rest.  Fluids.  Discussed quarantine guidelines.  Call with update.

## 2020-10-15 NOTE — Assessment & Plan Note (Signed)
Not checking sugars.  Follow [facility] and A1c.  Low-carb diet and exercise.

## 2020-10-22 ENCOUNTER — Other Ambulatory Visit: Payer: Self-pay

## 2020-10-22 ENCOUNTER — Other Ambulatory Visit (INDEPENDENT_AMBULATORY_CARE_PROVIDER_SITE_OTHER): Payer: Medicare Other

## 2020-10-22 DIAGNOSIS — R7989 Other specified abnormal findings of blood chemistry: Secondary | ICD-10-CM | POA: Diagnosis not present

## 2020-10-22 DIAGNOSIS — R944 Abnormal results of kidney function studies: Secondary | ICD-10-CM

## 2020-10-22 LAB — BASIC METABOLIC PANEL
BUN: 15 mg/dL (ref 6–23)
CO2: 30 mEq/L (ref 19–32)
Calcium: 10.2 mg/dL (ref 8.4–10.5)
Chloride: 98 mEq/L (ref 96–112)
Creatinine, Ser: 1.08 mg/dL (ref 0.40–1.20)
GFR: 46.37 mL/min — ABNORMAL LOW (ref >60.00)
Glucose, Bld: 151 mg/dL — ABNORMAL HIGH (ref 70–99)
Potassium: 3.3 mEq/L — ABNORMAL LOW (ref 3.5–5.1)
Sodium: 135 mEq/L (ref 135–145)

## 2020-10-24 ENCOUNTER — Telehealth: Payer: Self-pay

## 2020-10-24 DIAGNOSIS — E876 Hypokalemia: Secondary | ICD-10-CM

## 2020-10-24 NOTE — Telephone Encounter (Signed)
Orders placed for Basic Metabolic Panel for future labs

## 2020-10-30 ENCOUNTER — Telehealth: Payer: Self-pay | Admitting: Internal Medicine

## 2020-10-30 NOTE — Telephone Encounter (Signed)
If has had no allergies to shingles vaccine and has not had, ok to get.

## 2020-10-30 NOTE — Telephone Encounter (Signed)
Patient wanting to know if she is okay to get a shingles shot at her pharmacy?

## 2020-10-30 NOTE — Telephone Encounter (Signed)
I do not see any reason why she cannot get this vaccine in her chart. Just wanted to confirm with you that you are ok with it for documentation.

## 2020-10-30 NOTE — Telephone Encounter (Signed)
Patient is aware of below. 

## 2020-11-01 ENCOUNTER — Telehealth: Payer: Self-pay | Admitting: Internal Medicine

## 2020-11-01 NOTE — Telephone Encounter (Signed)
Pt has a lab appt on 11/05/2020, there are no orders in yet.

## 2020-11-02 ENCOUNTER — Other Ambulatory Visit: Payer: Self-pay | Admitting: Internal Medicine

## 2020-11-02 DIAGNOSIS — R944 Abnormal results of kidney function studies: Secondary | ICD-10-CM

## 2020-11-02 DIAGNOSIS — E876 Hypokalemia: Secondary | ICD-10-CM

## 2020-11-02 NOTE — Progress Notes (Signed)
Order placed for f/u lab.   

## 2020-11-02 NOTE — Telephone Encounter (Signed)
Order placed for f/u lab.   

## 2020-11-05 ENCOUNTER — Other Ambulatory Visit (INDEPENDENT_AMBULATORY_CARE_PROVIDER_SITE_OTHER): Payer: Medicare Other

## 2020-11-05 ENCOUNTER — Other Ambulatory Visit: Payer: Self-pay

## 2020-11-05 DIAGNOSIS — E876 Hypokalemia: Secondary | ICD-10-CM | POA: Diagnosis not present

## 2020-11-05 DIAGNOSIS — R944 Abnormal results of kidney function studies: Secondary | ICD-10-CM

## 2020-11-05 LAB — BASIC METABOLIC PANEL
BUN: 18 mg/dL (ref 6–23)
CO2: 29 mEq/L (ref 19–32)
Calcium: 10.1 mg/dL (ref 8.4–10.5)
Chloride: 98 mEq/L (ref 96–112)
Creatinine, Ser: 1.15 mg/dL (ref 0.40–1.20)
GFR: 42.99 mL/min — ABNORMAL LOW (ref >60.00)
Glucose, Bld: 118 mg/dL — ABNORMAL HIGH (ref 70–99)
Potassium: 4 mEq/L (ref 3.5–5.1)
Sodium: 135 mEq/L (ref 135–145)

## 2020-11-07 ENCOUNTER — Telehealth: Payer: Self-pay

## 2020-11-07 DIAGNOSIS — E876 Hypokalemia: Secondary | ICD-10-CM

## 2020-11-07 NOTE — Telephone Encounter (Signed)
Order placed for potassium for future labs

## 2020-11-13 ENCOUNTER — Other Ambulatory Visit: Payer: Self-pay | Admitting: Internal Medicine

## 2020-11-19 ENCOUNTER — Other Ambulatory Visit: Payer: Self-pay

## 2020-11-19 ENCOUNTER — Other Ambulatory Visit (INDEPENDENT_AMBULATORY_CARE_PROVIDER_SITE_OTHER): Payer: Medicare Other

## 2020-11-19 DIAGNOSIS — E876 Hypokalemia: Secondary | ICD-10-CM

## 2020-11-19 LAB — POTASSIUM: Potassium: 3.5 mEq/L (ref 3.5–5.1)

## 2020-12-05 ENCOUNTER — Other Ambulatory Visit: Payer: Self-pay | Admitting: General Surgery

## 2020-12-05 DIAGNOSIS — D0511 Intraductal carcinoma in situ of right breast: Secondary | ICD-10-CM

## 2020-12-18 ENCOUNTER — Other Ambulatory Visit: Payer: Self-pay

## 2020-12-18 ENCOUNTER — Encounter: Payer: Self-pay | Admitting: Internal Medicine

## 2020-12-18 ENCOUNTER — Ambulatory Visit (INDEPENDENT_AMBULATORY_CARE_PROVIDER_SITE_OTHER): Payer: Medicare Other | Admitting: Internal Medicine

## 2020-12-18 VITALS — BP 118/70 | HR 70 | Temp 98.0°F | Resp 16 | Ht 63.0 in | Wt 142.0 lb

## 2020-12-18 DIAGNOSIS — H6123 Impacted cerumen, bilateral: Secondary | ICD-10-CM

## 2020-12-18 DIAGNOSIS — E1165 Type 2 diabetes mellitus with hyperglycemia: Secondary | ICD-10-CM | POA: Diagnosis not present

## 2020-12-18 DIAGNOSIS — E7800 Pure hypercholesterolemia, unspecified: Secondary | ICD-10-CM

## 2020-12-18 DIAGNOSIS — Z23 Encounter for immunization: Secondary | ICD-10-CM | POA: Diagnosis not present

## 2020-12-18 DIAGNOSIS — Z0000 Encounter for general adult medical examination without abnormal findings: Secondary | ICD-10-CM | POA: Diagnosis not present

## 2020-12-18 DIAGNOSIS — G7000 Myasthenia gravis without (acute) exacerbation: Secondary | ICD-10-CM

## 2020-12-18 DIAGNOSIS — D0511 Intraductal carcinoma in situ of right breast: Secondary | ICD-10-CM

## 2020-12-18 DIAGNOSIS — I10 Essential (primary) hypertension: Secondary | ICD-10-CM | POA: Diagnosis not present

## 2020-12-18 DIAGNOSIS — F32A Depression, unspecified: Secondary | ICD-10-CM

## 2020-12-18 NOTE — Progress Notes (Signed)
Patient ID: Cynthia [name], female   DOB: December 11, 1931, 87 y.o.   MRN: [id number]   Subjective:    Patient ID: Cynthia [name], female    DOB: 05/28/1931, 87 y.o.   MRN: [id number]  This visit occurred during the SARS-CoV-2 public health emergency.  Safety protocols were in place, including screening questions prior to the visit, additional usage of staff PPE, and extensive cleaning of exam room while observing appropriate contact time as indicated for disinfecting solutions.   Patient here for her physical exam.    Chief Complaint  Patient presents with   Annual Exam   .   HPI Is doing relatively well.  Weight is stable.  She is eating.  States losing some taste for foods, but still eats regular meals.  Saw ortho - s/p knee injection.  No chest pain.  Breathing stable.  No acid reflux reported.  No abdominal pain.  Bowels moving.  Cerumen - ear.  Request ent referral.    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 Resected margins negative. Whole breast radiation   Cancer ([place]) 12-15-12   left upper arm, malignant melanoma Clark level II, Breslow thickness: 0.35 mm.   Diabetes mellitus ([place])    Hypercholesterolemia    Hypertension    Myasthenia gravis ([place])    ocular   Personal history of radiation therapy 2014   Rt. Breast   S/P radiation therapy 2015   BREAST CA   Past Surgical History:  Procedure Laterality Date   BREAST BIOPSY Right 2014   DCIS   BREAST LUMPECTOMY Right 2014   Lumpectomy   BREAST SURGERY Right 2014   lumpectomy   EYE SURGERY     Caterac both eyes   Family History  Problem Relation Age of Onset   Skin cancer Father    Cancer Father        colon   Colon cancer Brother    Cancer Brother        colon   Breast cancer Other        niece   Skin cancer Sister    Social History   Socioeconomic History   Marital status:  Divorced    Spouse name: Not on file   Number of children: Not on file   Years of education: Not on file   Highest education level: Not on file  Occupational History   Not on file  Tobacco Use   Smoking status: Never   Smokeless tobacco: Never  Substance and Sexual Activity   Alcohol use: No    Alcohol/week: 0.0 standard drinks   Drug use: No   Sexual activity: Never  Other Topics Concern   Not on file  Social History Narrative   Not on file   Social Determinants of Health   Financial Resource Strain: Low Risk    Difficulty of Paying Living Expenses: Not hard at all  Food Insecurity: No Food Insecurity   Worried About Charity fundraiser in the Last Year: Never true   [place] in the Last Year: Never true  Transportation Needs: No Transportation Needs   Lack of Transportation (Medical): No   Lack of Transportation (Non-Medical): No  Physical Activity: Unknown   Days of Exercise per Week: 0 days   Minutes of Exercise per Session: Not on  file  Stress: Not on file  Social Connections: Unknown   Frequency of Communication with Friends and Family: More than three times a week   Frequency of Social Gatherings with Friends and Family: More than three times a week   Attends Religious Services: Not on file   Active Member of Clubs or Organizations: Not on file   Attends Archivist Meetings: Not on file   Marital Status: Not on file     Review of Systems  Constitutional:  Negative for appetite change and unexpected weight change.  HENT:  Negative for congestion, sinus pressure and sore throat.   Eyes:  Negative for pain and visual disturbance.  Respiratory:  Negative for cough, chest tightness and shortness of breath.   Cardiovascular:  Negative for chest pain, palpitations and leg swelling.  Gastrointestinal:  Negative for abdominal pain, diarrhea and vomiting.  Genitourinary:  Negative for difficulty urinating and dysuria.  Musculoskeletal:  Negative for  joint swelling and myalgias.  Skin:  Negative for color change and rash.  Neurological:  Negative for dizziness, light-headedness and headaches.  Hematological:  Negative for adenopathy. Does not bruise/bleed easily.  Psychiatric/Behavioral:  Negative for agitation and dysphoric mood.       Objective:     BP 118/70   Pulse 70   Temp 98 F (36.7 C)   Resp 16   Ht _0  (1.6 m)   Wt 142 lb (64.4 kg)   SpO2 98%   BMI 25.15 kg/m  Wt Readings from Last 3 Encounters:  12/18/20 142 lb (64.4 kg)  08/14/20 142 lb 3.2 oz (64.5 kg)  07/10/20 141 lb (64 kg)    Physical Exam Vitals reviewed.  Constitutional:      General: She is not in acute distress.    Appearance: Normal appearance. She is well-developed.  HENT:     Head: Normocephalic and atraumatic.     Right Ear: External ear normal. There is impacted cerumen.     Left Ear: External ear normal. There is impacted cerumen.  Eyes:     General: No scleral icterus.       Right eye: No discharge.        Left eye: No discharge.     Conjunctiva/sclera: Conjunctivae normal.  Neck:     Thyroid: No thyromegaly.  Cardiovascular:     Rate and Rhythm: Normal rate and regular rhythm.  Pulmonary:     Effort: No tachypnea, accessory muscle usage or respiratory distress.     Breath sounds: Normal breath sounds. No decreased breath sounds or wheezing.  Chest:  Breasts:    Right: No inverted nipple, mass, nipple discharge or tenderness (no axillary adenopathy).     Left: No inverted nipple, mass, nipple discharge or tenderness (no axilarry adenopathy).  Abdominal:     General: Bowel sounds are normal.     Palpations: Abdomen is soft.     Tenderness: There is no abdominal tenderness.  Musculoskeletal:        General: No swelling or tenderness.     Cervical back: Neck supple. No tenderness.  Lymphadenopathy:     Cervical: No cervical adenopathy.  Skin:    Findings: No erythema or rash.  Neurological:     Mental Status: She is alert  and oriented to person, place, and time.  Psychiatric:        Mood and Affect: Mood normal.        Behavior: Behavior normal.     Outpatient Encounter Medications as  of 12/18/2020  Medication Sig   ACCU-CHEK SOFTCLIX LANCETS lancets USE ONCE A DAY   amLODipine (NORVASC) 10 MG tablet TAKE 1 TABLET BY MOUTH  DAILY   aspirin 81 MG tablet Take 81 mg by mouth daily.   atorvastatin (LIPITOR) 20 MG tablet TAKE 1 TABLET BY MOUTH  DAILY   Calcium Carbonate-Vitamin D 600-400 MG-UNIT tablet Take 1 tablet by mouth 2 (two) times daily.   CRANBERRY PO Take by mouth daily.   glucose blood (ACCU-CHEK AVIVA PLUS) test strip 1 EACH BY OTHER ROUTE AS NEEDED FOR OTHER. USE AS INSTRUCTED TO CHECK BLOOD SUGAR ONCE DAILY.DX E11.9   hydrochlorothiazide (HYDRODIURIL) 25 MG tablet TAKE 1 TABLET BY MOUTH  DAILY   Multiple Vitamins-Minerals (ICAPS AREDS 2) CAPS Take 1 capsule by mouth 2 (two) times daily.   Multiple Vitamins-Minerals (PRESERVISION AREDS 2+MULTI VIT PO) Take by mouth.   potassium chloride (KLOR-CON) 10 MEQ tablet TAKE 1 TABLET BY MOUTH  DAILY (Patient taking differently: 10 mEq 2 (two) times daily.)   ramipril (ALTACE) 10 MG capsule TAKE 1 CAPSULE BY MOUTH  DAILY   traZODone (DESYREL) 50 MG tablet TAKE 1 TABLET BY MOUTH AT  BEDTIME AS NEEDED FOR SLEEP   No facility-administered encounter medications on file as of 12/18/2020.     Lab Results  Component Value Date   WBC 7.5 04/10/2020   HGB 12.8 04/10/2020   HCT 38.7 04/10/2020   PLT 227.0 04/10/2020   GLUCOSE 118 (H) 11/05/2020   CHOL 144 08/20/2020   TRIG 113.0 08/20/2020   HDL 43.00 08/20/2020   LDLCALC 78 08/20/2020   ALT 12 08/20/2020   AST 16 08/20/2020   NA 135 11/05/2020   K 3.5 11/19/2020   CL 98 11/05/2020   CREATININE 1.15 11/05/2020   BUN 18 11/05/2020   CO2 29 11/05/2020   TSH 0.97 04/10/2020   HGBA1C 7.7 (H) 08/20/2020   MICROALBUR 2.4 (H) 07/06/2019    MM 3D SCREEN BREAST BILATERAL  Result Date:  01/21/2020 CLINICAL DATA:  Screening. History of RIGHT breast cancer in 2014 status post lumpectomy and radiation therapy. EXAM: DIGITAL SCREENING BILATERAL MAMMOGRAM WITH TOMO AND CAD COMPARISON:  Previous exam(s). ACR Breast Density Category c: The breast tissue is heterogeneously dense, which may obscure small masses. FINDINGS: There are no findings suspicious for malignancy. Images were processed with CAD. IMPRESSION: No mammographic evidence of malignancy. A result letter of this screening mammogram will be mailed directly to the patient. RECOMMENDATION: Screening mammogram in one year. (Code:SM-B-01Y) BI-RADS CATEGORY  1: Negative. Electronically Signed   By: Franki Cabot M.D.   On: 01/21/2020 09:40       Assessment & Plan:   Problem List Items Addressed This Visit     Cerumen impaction    Cerumen impaction - left > right.  Request referral to ENT.       Relevant Orders   Ambulatory referral to ENT   DCIS (ductal carcinoma in situ) of breast    Followed by Dr Bary Castilla as outlined.  Mammogram 01/21/20 - [facility] I.       Diabetes mellitus ([place])    Follow met b and a1c.        Relevant Orders   Hemoglobin J8A   Basic metabolic panel   Microalbumin / creatinine urine ratio   Health care maintenance    Physical today 12/18/20.  Mammogram 01/21/20 - Birads I.  Has been seeing Dr Bary Castilla.       Hypercholesterolemia  On lipitor.  Low cholesterol diet and exercise.  Follow lipid panel and liver function tests.        Relevant Orders   Hepatic function panel   Lipid panel   Hypertension    Blood pressure as outlined.  Continue amlodipine, ramipril and hctz.  No changes made.  Follow pressures.  Follow metabolic panel.       Mild depression    Overall stable.  Follow.       Myasthenia gravis [facility])    Has been followed by Dr Manuella Ghazi.  Stable.       Other Visit Diagnoses     Need for immunization against influenza    -  Primary   Relevant Orders   Flu Vaccine QUAD High  Dose(Fluad) (Completed)        Einar Pheasant, MD

## 2020-12-18 NOTE — Progress Notes (Signed)
Patient ID: Cynthia [name], female   DOB: December 31, 1931, 87 y.o.   MRN: [id number]

## 2020-12-30 ENCOUNTER — Encounter: Payer: Self-pay | Admitting: Internal Medicine

## 2020-12-30 DIAGNOSIS — H6120 Impacted cerumen, unspecified ear: Secondary | ICD-10-CM | POA: Insufficient documentation

## 2020-12-30 NOTE — Assessment & Plan Note (Signed)
Overall stable.  Follow.

## 2020-12-30 NOTE — Assessment & Plan Note (Signed)
Physical today 12/18/20.  Mammogram 01/21/20 - Birads I.  Has been seeing Dr Bary Castilla.

## 2020-12-30 NOTE — Assessment & Plan Note (Signed)
Followed by Dr Bary Castilla as outlined.  Mammogram 01/21/20 - [facility] I.

## 2020-12-30 NOTE — Assessment & Plan Note (Signed)
Blood pressure as outlined.  Continue amlodipine, ramipril and hctz.  No changes made.  Follow pressures.  Follow metabolic panel.  

## 2020-12-30 NOTE — Assessment & Plan Note (Signed)
Has been followed by Dr Shah.  Stable.  

## 2020-12-30 NOTE — Assessment & Plan Note (Signed)
Follow met b and a1c.

## 2020-12-30 NOTE — Assessment & Plan Note (Signed)
Cerumen impaction - left > right.  Request referral to ENT.

## 2020-12-30 NOTE — Assessment & Plan Note (Signed)
On lipitor.  Low cholesterol diet and exercise.  Follow lipid panel and liver function tests.   

## 2021-01-02 ENCOUNTER — Other Ambulatory Visit: Payer: Self-pay

## 2021-01-02 ENCOUNTER — Other Ambulatory Visit (INDEPENDENT_AMBULATORY_CARE_PROVIDER_SITE_OTHER): Payer: Medicare Other

## 2021-01-02 DIAGNOSIS — E1165 Type 2 diabetes mellitus with hyperglycemia: Secondary | ICD-10-CM

## 2021-01-02 DIAGNOSIS — E7800 Pure hypercholesterolemia, unspecified: Secondary | ICD-10-CM

## 2021-01-02 LAB — HEPATIC FUNCTION PANEL
ALT: 12 U/L (ref 0–35)
AST: 15 U/L (ref 0–37)
Albumin: 3.8 g/dL (ref 3.5–5.2)
Alkaline Phosphatase: 89 U/L (ref 39–117)
Bilirubin, Direct: 0.1 mg/dL (ref 0.0–0.3)
Total Bilirubin: 0.6 mg/dL (ref 0.2–1.2)
Total Protein: 6.6 g/dL (ref 6.0–8.3)

## 2021-01-02 LAB — BASIC METABOLIC PANEL
BUN: 16 mg/dL (ref 6–23)
CO2: 32 mEq/L (ref 19–32)
Calcium: 9.9 mg/dL (ref 8.4–10.5)
Chloride: 103 mEq/L (ref 96–112)
Creatinine, Ser: 0.95 mg/dL (ref 0.40–1.20)
GFR: 54.01 mL/min — ABNORMAL LOW (ref >60.00)
Glucose, Bld: 138 mg/dL — ABNORMAL HIGH (ref 70–99)
Potassium: 3.5 mEq/L (ref 3.5–5.1)
Sodium: 140 mEq/L (ref 135–145)

## 2021-01-02 LAB — LIPID PANEL
Cholesterol: 140 mg/dL (ref 0–200)
HDL: 44.3 mg/dL (ref >39.00)
LDL Cholesterol: 76 mg/dL (ref 0–99)
NonHDL: 95.86
Total CHOL/HDL Ratio: 3
Triglycerides: 100 mg/dL (ref 0.0–149.0)
VLDL: 20 mg/dL (ref 0.0–40.0)

## 2021-01-02 LAB — HEMOGLOBIN A1C: Hgb A1c MFr Bld: 7.8 % — ABNORMAL HIGH (ref 4.6–6.5)

## 2021-01-02 NOTE — Addendum Note (Signed)
Addended by: Neta Ehlers on: 01/02/2021 11:26 AM   Modules accepted: Orders

## 2021-01-03 ENCOUNTER — Encounter: Payer: Self-pay | Admitting: *Deleted

## 2021-01-11 ENCOUNTER — Other Ambulatory Visit: Payer: Self-pay | Admitting: Internal Medicine

## 2021-01-12 NOTE — Telephone Encounter (Signed)
Received refill request for potassium.  Need to clarify how she is taking her potassium.  Is she taking q day or bid.

## 2021-01-14 ENCOUNTER — Other Ambulatory Visit: Payer: Self-pay | Admitting: Internal Medicine

## 2021-01-21 ENCOUNTER — Ambulatory Visit
Admission: RE | Admit: 2021-01-21 | Discharge: 2021-01-21 | Disposition: A | Discharge status: Home / Self Care | Payer: Medicare Other | Source: Ambulatory Visit | Attending: General Surgery | Admitting: General Surgery

## 2021-01-21 ENCOUNTER — Other Ambulatory Visit: Payer: Medicare Other

## 2021-01-21 ENCOUNTER — Other Ambulatory Visit: Payer: Self-pay

## 2021-01-21 DIAGNOSIS — E1165 Type 2 diabetes mellitus with hyperglycemia: Secondary | ICD-10-CM | POA: Diagnosis not present

## 2021-01-21 DIAGNOSIS — E7800 Pure hypercholesterolemia, unspecified: Secondary | ICD-10-CM

## 2021-01-21 DIAGNOSIS — Z1231 Encounter for screening mammogram for malignant neoplasm of breast: Secondary | ICD-10-CM | POA: Diagnosis present

## 2021-01-21 DIAGNOSIS — D0511 Intraductal carcinoma in situ of right breast: Secondary | ICD-10-CM | POA: Insufficient documentation

## 2021-01-21 LAB — MICROALBUMIN / CREATININE URINE RATIO
Creatinine,U: 52.5 mg/dL
Microalb Creat Ratio: 1.4 mg/g (ref 0.0–30.0)
Microalb, Ur: 0.7 mg/dL (ref 0.0–1.9)

## 2021-01-23 ENCOUNTER — Telehealth: Payer: Self-pay | Admitting: Internal Medicine

## 2021-01-23 MED ORDER — POTASSIUM CHLORIDE ER 10 MEQ PO TBCR: 10.0000 meq | EXTENDED_RELEASE_TABLET | Freq: Two times a day (BID) | ORAL | 3 refills | Status: DC

## 2021-01-23 NOTE — Telephone Encounter (Signed)
Rx ok'd for potassium 71meq bid #[phone] with 3 refills.  [facility] to [facility].  Please notify pt.  Also confirm that this is sent to mail order.  Need to confirm she will not run out before getting the potassium in the mail.

## 2021-01-23 NOTE — Telephone Encounter (Signed)
Pt called in regards to medication potassium chloride (KLOR-CON) 10 MEQ tablet Pt states the pharmacy can't refill her prescription due to a new prescription needing to be sent in. Pt is supposed to be taking two potassium pills a day. Pt only has one pill left. Pharmacy also states they faxed over the information already. Pt says this has to be in within the next hour for her to receive her medication. Pt uses [facility] home delivery.  Ref# [id number]

## 2021-01-30 ENCOUNTER — Other Ambulatory Visit: Payer: Self-pay | Admitting: Internal Medicine

## 2021-03-28 ENCOUNTER — Ambulatory Visit (INDEPENDENT_AMBULATORY_CARE_PROVIDER_SITE_OTHER): Payer: Medicare Other | Admitting: Internal Medicine

## 2021-03-28 ENCOUNTER — Other Ambulatory Visit: Payer: Self-pay

## 2021-03-28 DIAGNOSIS — G7000 Myasthenia gravis without (acute) exacerbation: Secondary | ICD-10-CM

## 2021-03-28 DIAGNOSIS — I10 Essential (primary) hypertension: Secondary | ICD-10-CM

## 2021-03-28 DIAGNOSIS — E7800 Pure hypercholesterolemia, unspecified: Secondary | ICD-10-CM | POA: Diagnosis not present

## 2021-03-28 DIAGNOSIS — Z85820 Personal history of malignant melanoma of skin: Secondary | ICD-10-CM

## 2021-03-28 DIAGNOSIS — E1165 Type 2 diabetes mellitus with hyperglycemia: Secondary | ICD-10-CM | POA: Diagnosis not present

## 2021-03-28 DIAGNOSIS — D0511 Intraductal carcinoma in situ of right breast: Secondary | ICD-10-CM

## 2021-03-28 NOTE — Progress Notes (Signed)
Patient ID: Cynthia [name], female   DOB: 04-Oct-1931, 88 y.o.   MRN: [id number]   Subjective:    Patient ID: Cynthia [name], female    DOB: [birth year], 88 y.o.   MRN: [id number]  This visit occurred during the SARS-CoV-2 public health emergency.  Safety protocols were in place, including screening questions prior to the visit, additional usage of staff PPE, and extensive cleaning of exam room while observing appropriate contact time as indicated for disinfecting solutions.   Patient here for a scheduled follow up.   Chief Complaint  Patient presents with   Hypertension   .   HPI Here to follow up regarding her blood pressure and blood sugar.  Tries to stay active.  No chest pain or sob reported.  No abdominal pain.  Eating.  No nausea or vomiting.  Bowels moving.  Handling stress.  Denies depression.  States am blood sugars 114-126.     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 Resected margins negative. Whole breast radiation   Cancer ([place]) 12-15-12   left upper arm, malignant melanoma Clark level II, Breslow thickness: 0.35 mm.   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 Breast   S/P radiation therapy 2015   BREAST CA   Past Surgical History:  Procedure Laterality Date   BREAST BIOPSY Right 2014   DCIS   BREAST LUMPECTOMY Right 2014   Lumpectomy   BREAST SURGERY Right 2014   lumpectomy   EYE SURGERY     Caterac both eyes   Family History  Problem Relation Age of Onset   Skin cancer Father    Cancer Father        colon   Colon cancer Brother    Cancer Brother        colon   Breast cancer Other        niece   Skin cancer Sister    Social History   Socioeconomic History   Marital status: Divorced    Spouse name: Not on file    Number of children: Not on file   Years of education: Not on file   Highest education level: Not on file  Occupational History   Not on file  Tobacco Use   Smoking status: Never   Smokeless tobacco: Never  Substance and Sexual Activity   Alcohol use: No    Alcohol/week: 0.0 standard drinks   Drug use: No   Sexual activity: Never  Other Topics Concern   Not on file  Social History Narrative   Not on file   Social Determinants of Health   Financial Resource Strain: Low Risk    Difficulty of Paying Living Expenses: Not hard at all  Food Insecurity: No Food Insecurity   Worried About Charity fundraiser in the Last Year: Never true   [place] in the Last Year: Never true  Transportation Needs: No Transportation Needs   Lack of Transportation (Medical): No   Lack of Transportation (Non-Medical): No  Physical Activity: Unknown   Days of Exercise per Week: 0 days   Minutes of Exercise per Session: Not on file  Stress: Not on file  Social Connections:  Unknown   Frequency of Communication with Friends and Family: More than three times a week   Frequency of Social Gatherings with Friends and Family: More than three times a week   Attends Religious Services: Not on Electrical engineer or Organizations: Not on file   Attends Archivist Meetings: Not on file   Marital Status: Not on file     Review of Systems  Constitutional:  Negative for appetite change and unexpected weight change.  HENT:  Negative for congestion and sinus pressure.   Respiratory:  Negative for cough, chest tightness and shortness of breath.   Cardiovascular:  Negative for chest pain, palpitations and leg swelling.  Gastrointestinal:  Negative for abdominal pain, diarrhea, nausea and vomiting.  Genitourinary:  Negative for difficulty urinating and dysuria.  Musculoskeletal:  Negative for joint swelling and myalgias.  Skin:  Negative for color change and rash.  Neurological:   Negative for dizziness, light-headedness and headaches.  Psychiatric/Behavioral:  Negative for agitation and dysphoric mood.       Objective:     BP 122/68    Pulse 86    Temp 97.9 F (36.6 C)    Resp 16    Wt 141 lb 3.2 oz (64 kg)    SpO2 99%    BMI 25.01 kg/m  Wt Readings from Last 3 Encounters:  03/28/21 141 lb 3.2 oz (64 kg)  12/18/20 142 lb (64.4 kg)  08/14/20 142 lb 3.2 oz (64.5 kg)    Physical Exam Vitals reviewed.  Constitutional:      General: She is not in acute distress.    Appearance: Normal appearance.  HENT:     Head: Normocephalic and atraumatic.     Right Ear: External ear normal.     Left Ear: External ear normal.  Eyes:     General: No scleral icterus.       Right eye: No discharge.        Left eye: No discharge.     Conjunctiva/sclera: Conjunctivae normal.  Neck:     Thyroid: No thyromegaly.  Cardiovascular:     Rate and Rhythm: Normal rate and regular rhythm.  Pulmonary:     Effort: No respiratory distress.     Breath sounds: Normal breath sounds. No wheezing.  Abdominal:     General: Bowel sounds are normal.     Palpations: Abdomen is soft.     Tenderness: There is no abdominal tenderness.  Musculoskeletal:        General: No swelling or tenderness.     Cervical back: Neck supple. No tenderness.  Lymphadenopathy:     Cervical: No cervical adenopathy.  Skin:    Findings: No erythema or rash.  Neurological:     Mental Status: She is alert.  Psychiatric:        Mood and Affect: Mood normal.        Behavior: Behavior normal.     Outpatient Encounter Medications as of 03/28/2021  Medication Sig   ACCU-CHEK SOFTCLIX LANCETS lancets USE ONCE A DAY   amLODipine (NORVASC) 10 MG tablet TAKE 1 TABLET BY MOUTH  DAILY   aspirin 81 MG tablet Take 81 mg by mouth daily.   atorvastatin (LIPITOR) 20 MG tablet TAKE 1 TABLET BY MOUTH  DAILY   Calcium Carbonate-Vitamin D 600-400 MG-UNIT tablet Take 1 tablet by mouth 2 (two) times daily.   CRANBERRY PO Take  by mouth daily.   glucose blood (ACCU-CHEK AVIVA PLUS) test  strip USE AS INSTRUCTED TO CHECK  BLOOD SUGAR ONCE DAILY AS  NEEDED   hydrochlorothiazide (HYDRODIURIL) 25 MG tablet TAKE 1 TABLET BY MOUTH  DAILY   Multiple Vitamins-Minerals (ICAPS AREDS 2) CAPS Take 1 capsule by mouth 2 (two) times daily.   Multiple Vitamins-Minerals (PRESERVISION AREDS 2+MULTI VIT PO) Take by mouth.   potassium chloride (KLOR-CON) 10 MEQ tablet Take 1 tablet (10 mEq total) by mouth 2 (two) times daily.   ramipril (ALTACE) 10 MG capsule TAKE 1 CAPSULE BY MOUTH  DAILY   traZODone (DESYREL) 50 MG tablet TAKE 1 TABLET BY MOUTH AT  BEDTIME AS NEEDED FOR SLEEP   No facility-administered encounter medications on file as of 03/28/2021.     Lab Results  Component Value Date   WBC 7.5 04/10/2020   HGB 12.8 04/10/2020   HCT 38.7 04/10/2020   PLT 227.0 04/10/2020   GLUCOSE 138 (H) 01/02/2021   CHOL 140 01/02/2021   TRIG 100.0 01/02/2021   HDL 44.30 01/02/2021   LDLCALC 76 01/02/2021   ALT 12 01/02/2021   AST 15 01/02/2021   NA 140 01/02/2021   K 3.5 01/02/2021   CL 103 01/02/2021   CREATININE 0.95 01/02/2021   BUN 16 01/02/2021   CO2 32 01/02/2021   TSH 0.97 04/10/2020   HGBA1C 7.8 (H) 01/02/2021   MICROALBUR 0.7 01/21/2021    MM 3D SCREEN BREAST BILATERAL  Result Date: 01/22/2021 CLINICAL DATA:  Screening. EXAM: DIGITAL SCREENING BILATERAL MAMMOGRAM WITH TOMOSYNTHESIS AND CAD TECHNIQUE: Bilateral screening digital craniocaudal and mediolateral oblique mammograms were obtained. Bilateral screening digital breast tomosynthesis was performed. The images were evaluated with computer-aided detection. COMPARISON:  Previous exam(s). ACR Breast Density Category c: The breast tissue is heterogeneously dense, which may obscure small masses. FINDINGS: There are no findings suspicious for malignancy. IMPRESSION: No mammographic evidence of malignancy. A result letter of this screening mammogram will be mailed directly to  the patient. RECOMMENDATION: Screening mammogram in one year. (Code:SM-B-01Y) BI-RADS CATEGORY  1: Negative. Electronically Signed   By: Dorise Bullion III M.D.   On: 01/22/2021 11:57      Assessment & Plan:   Problem List Items Addressed This Visit     DCIS (ductal carcinoma in situ) of breast    Saw Dr Bary Castilla 01/29/21.  Mammogram 01/22/21 - [facility] I.  Recommended continuing yearly mammogram.       Diabetes mellitus ([place])    Follow met b and a1c.        Relevant Orders   Hemoglobin R7E   Basic metabolic panel   History of melanoma    Followed by dermatology.        Hypercholesterolemia    On lipitor.  Low cholesterol diet and exercise.  Follow lipid panel and liver function tests.        Relevant Orders   CBC with Differential/Platelet   Hepatic function panel   Lipid panel   TSH   Hypertension    Blood pressure as outlined.  Continue amlodipine, ramipril and hctz.  No changes made.  Follow pressures.  Follow metabolic panel.       Myasthenia gravis [facility])    Has been followed by Dr Manuella Ghazi.  Stable.         Einar Pheasant, MD

## 2021-03-30 ENCOUNTER — Encounter: Payer: Self-pay | Admitting: Internal Medicine

## 2021-03-30 NOTE — Assessment & Plan Note (Signed)
Saw Dr Byrnett 01/29/21.  Mammogram 01/22/21 - Birads I.  Recommended continuing yearly mammogram.  

## 2021-03-30 NOTE — Assessment & Plan Note (Signed)
On lipitor.  Low cholesterol diet and exercise.  Follow lipid panel and liver function tests.   

## 2021-03-30 NOTE — Assessment & Plan Note (Signed)
Has been followed by Dr Shah.  Stable.  

## 2021-03-30 NOTE — Assessment & Plan Note (Signed)
Followed by dermatology

## 2021-03-30 NOTE — Assessment & Plan Note (Signed)
Follow met b and a1c.  

## 2021-03-30 NOTE — Assessment & Plan Note (Signed)
Blood pressure as outlined.  Continue amlodipine, ramipril and hctz.  No changes made.  Follow pressures.  Follow metabolic panel.  

## 2021-05-16 ENCOUNTER — Telehealth: Payer: Self-pay | Admitting: Internal Medicine

## 2021-05-16 NOTE — Telephone Encounter (Signed)
Rejection Reason - Other - pt cancelled appt" ?Cynthia [name] said on May 08, 2021 1:03 PM ? ?Pt had appt on 05/07/21 3:00pm ? ?Msg from [place] ent ?

## 2021-06-25 ENCOUNTER — Other Ambulatory Visit (INDEPENDENT_AMBULATORY_CARE_PROVIDER_SITE_OTHER): Payer: Medicare Other

## 2021-06-25 DIAGNOSIS — E1165 Type 2 diabetes mellitus with hyperglycemia: Secondary | ICD-10-CM | POA: Diagnosis not present

## 2021-06-25 DIAGNOSIS — E7800 Pure hypercholesterolemia, unspecified: Secondary | ICD-10-CM | POA: Diagnosis not present

## 2021-06-25 LAB — CBC WITH DIFFERENTIAL/PLATELET
Basophils Absolute: 0 10*3/uL (ref 0.0–0.1)
Basophils Relative: 0.3 % (ref 0.0–3.0)
Eosinophils Absolute: 0.2 10*3/uL (ref 0.0–0.7)
Eosinophils Relative: 3.2 % (ref 0.0–5.0)
HCT: 38.7 % (ref 36.0–46.0)
Hemoglobin: 12.6 g/dL (ref 12.0–15.0)
Lymphocytes Relative: 29.1 % (ref 12.0–46.0)
Lymphs Abs: 2.1 10*3/uL (ref 0.7–4.0)
MCHC: 32.5 g/dL (ref 30.0–36.0)
MCV: 84.4 fl (ref 78.0–100.0)
Monocytes Absolute: 0.5 10*3/uL (ref 0.1–1.0)
Monocytes Relative: 6.8 % (ref 3.0–12.0)
Neutro Abs: 4.3 10*3/uL (ref 1.4–7.7)
Neutrophils Relative %: 60.6 % (ref 43.0–77.0)
Platelets: 236 10*3/uL (ref 150.0–400.0)
RBC: 4.58 Mil/uL (ref 3.87–5.11)
RDW: 14.8 % (ref 11.5–15.5)
WBC: 7.1 10*3/uL (ref 4.0–10.5)

## 2021-06-25 LAB — TSH: TSH: 1.37 u[IU]/mL (ref 0.35–5.50)

## 2021-06-25 LAB — HEPATIC FUNCTION PANEL
ALT: 12 U/L (ref 0–35)
AST: 14 U/L (ref 0–37)
Albumin: 4 g/dL (ref 3.5–5.2)
Alkaline Phosphatase: 96 U/L (ref 39–117)
Bilirubin, Direct: 0.1 mg/dL (ref 0.0–0.3)
Total Bilirubin: 0.7 mg/dL (ref 0.2–1.2)
Total Protein: 6.9 g/dL (ref 6.0–8.3)

## 2021-06-25 LAB — LIPID PANEL
Cholesterol: 166 mg/dL (ref 0–200)
HDL: 48.3 mg/dL (ref >39.00)
LDL Cholesterol: 89 mg/dL (ref 0–99)
NonHDL: 117.32
Total CHOL/HDL Ratio: 3
Triglycerides: 143 mg/dL (ref 0.0–149.0)
VLDL: 28.6 mg/dL (ref 0.0–40.0)

## 2021-06-25 LAB — BASIC METABOLIC PANEL
BUN: 18 mg/dL (ref 6–23)
CO2: 30 mEq/L (ref 19–32)
Calcium: 9.8 mg/dL (ref 8.4–10.5)
Chloride: 101 mEq/L (ref 96–112)
Creatinine, Ser: 1.24 mg/dL — ABNORMAL HIGH (ref 0.40–1.20)
GFR: 39.1 mL/min — ABNORMAL LOW (ref >60.00)
Glucose, Bld: 140 mg/dL — ABNORMAL HIGH (ref 70–99)
Potassium: 3.8 mEq/L (ref 3.5–5.1)
Sodium: 138 mEq/L (ref 135–145)

## 2021-06-25 LAB — HEMOGLOBIN A1C: Hgb A1c MFr Bld: 7.5 % — ABNORMAL HIGH (ref 4.6–6.5)

## 2021-06-26 ENCOUNTER — Telehealth: Payer: Self-pay

## 2021-06-26 NOTE — Telephone Encounter (Signed)
Patient returned our call.  Patient said she would like to know about her sugar and asked that we please call her back today. ?

## 2021-06-26 NOTE — Telephone Encounter (Signed)
Lm for pt to cb re : ? ?Cynthia [name] has an upcoming appt with me, but please call her and let her know that her cholesterol levels look good.  Overall sugar control has improved.  Kidney function has decreased from last check. (Has varied previously).  Confirm not taking any antiinflammatories.  Make sure staying hydrated.  Will will recheck her kidney function and urinalysis at her appt.  Please make note on schedule  ?

## 2021-06-26 NOTE — Telephone Encounter (Signed)
-----   Message from Einar Pheasant, MD sent at 06/26/2021  5:27 AM EDT ----- ?Cynthia [name] has an upcoming appt with me, but please call her and let her know that her cholesterol levels look good.  Overall sugar control has improved.  Kidney function has decreased from last check. (Has varied previously).  Confirm not taking any antiinflammatories.  Make sure staying hydrated.  Will will recheck her kidney function and urinalysis at her appt.  Please make note on schedule  ?

## 2021-06-27 ENCOUNTER — Ambulatory Visit: Payer: Medicare Other | Admitting: Internal Medicine

## 2021-06-27 ENCOUNTER — Other Ambulatory Visit: Payer: Self-pay

## 2021-06-27 DIAGNOSIS — N289 Disorder of kidney and ureter, unspecified: Secondary | ICD-10-CM

## 2021-06-27 NOTE — Telephone Encounter (Signed)
S/w pt - advised of results ?

## 2021-06-28 ENCOUNTER — Other Ambulatory Visit: Payer: Self-pay | Admitting: Internal Medicine

## 2021-07-03 ENCOUNTER — Ambulatory Visit: Payer: Medicare Other | Admitting: Internal Medicine

## 2021-07-04 ENCOUNTER — Ambulatory Visit (INDEPENDENT_AMBULATORY_CARE_PROVIDER_SITE_OTHER): Payer: Medicare Other | Admitting: Internal Medicine

## 2021-07-04 ENCOUNTER — Encounter: Payer: Self-pay | Admitting: Internal Medicine

## 2021-07-04 DIAGNOSIS — I10 Essential (primary) hypertension: Secondary | ICD-10-CM

## 2021-07-04 DIAGNOSIS — E7800 Pure hypercholesterolemia, unspecified: Secondary | ICD-10-CM

## 2021-07-04 DIAGNOSIS — F320 Major depressive disorder, single episode, mild: Secondary | ICD-10-CM

## 2021-07-04 DIAGNOSIS — E1165 Type 2 diabetes mellitus with hyperglycemia: Secondary | ICD-10-CM

## 2021-07-04 DIAGNOSIS — D0511 Intraductal carcinoma in situ of right breast: Secondary | ICD-10-CM

## 2021-07-04 DIAGNOSIS — N289 Disorder of kidney and ureter, unspecified: Secondary | ICD-10-CM

## 2021-07-04 DIAGNOSIS — G7000 Myasthenia gravis without (acute) exacerbation: Secondary | ICD-10-CM

## 2021-07-04 DIAGNOSIS — M79671 Pain in right foot: Secondary | ICD-10-CM

## 2021-07-04 NOTE — Progress Notes (Signed)
Patient ID: Cynthia [name], female   DOB: 21-Jul-1931, 88 y.o.   MRN: [id number] ? ? ?Subjective:  ? ? Patient ID: Cynthia [name], female    DOB: 05-28-1931, 88 y.o.   MRN: [id number] ? ?This visit occurred during the SARS-CoV-2 public health emergency.  Safety protocols were in place, including screening questions prior to the visit, additional usage of staff PPE, and extensive cleaning of exam room while observing appropriate contact time as indicated for disinfecting solutions.  ? ?Patient here for a scheduled follow up.  ? ?Chief Complaint  ?Patient presents with  ? Follow-up  ?  Follow up for HTN  ? .  ? ?HPI ?Having right heel pain.  Hurts to walk.  Reports no problems getting around her house.  No chest pain or sob reported.  No increased cough or congestion.  No abdominal pain.  Bowels moving.  Blood pressures on outside checks doing well (025-427 systolic readings). Discussed labs.  ? ? ?Past Medical History:  ?Diagnosis Date  ? Allergic rhinitis   ? Breast cancer ([place]) 2014  ? RT LUMPECTOMY  ? Cancer [facility]) 2014  ? 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 Resected margins negative. Whole breast radiation  ? Cancer [facility]) 12-15-12  ? left upper arm, malignant melanoma Clark level II, Breslow thickness: 0.35 mm.  ? Diabetes mellitus ([place])   ? Hypercholesterolemia   ? Hypertension   ? Myasthenia gravis ([place])   ? ocular  ? Personal history of radiation therapy 2014  ? Rt. Breast  ? S/P radiation therapy 2015  ? BREAST CA  ? ?Past Surgical History:  ?Procedure Laterality Date  ? BREAST BIOPSY Right 2014  ? DCIS  ? BREAST LUMPECTOMY Right 2014  ? Lumpectomy  ? BREAST SURGERY Right 2014  ? lumpectomy  ? EYE SURGERY    ? Caterac both eyes  ? ?Family History  ?Problem Relation Age of Onset  ? Skin cancer Father   ? Cancer Father   ?     colon  ? Colon cancer Brother   ? Cancer Brother   ?     colon  ? Breast cancer Other   ?     niece  ? Skin cancer Sister   ? ?Social History  ? ?Socioeconomic History  ? Marital status:  Divorced  ?  Spouse name: Not on file  ? Number of children: Not on file  ? Years of education: Not on file  ? Highest education level: Not on file  ?Occupational History  ? Not on file  ?Tobacco Use  ? Smoking status: Never  ? Smokeless tobacco: Never  ?Substance and Sexual Activity  ? Alcohol use: No  ?  Alcohol/week: 0.0 standard drinks  ? Drug use: No  ? Sexual activity: Never  ?Other Topics Concern  ? Not on file  ?Social History Narrative  ? Not on file  ? ?Social Determinants of Health  ? ?Financial Resource Strain: Low Risk   ? Difficulty of Paying Living Expenses: Not hard at all  ?Food Insecurity: No Food Insecurity  ? Worried About Charity fundraiser in the Last Year: Never true  ? Ran Out of Food in the Last Year: Never true  ?Transportation Needs: No Transportation Needs  ? Lack of Transportation (Medical): No  ? Lack of Transportation (Non-Medical): No  ?Physical Activity: Unknown  ? Days of Exercise per Week: 0 days  ? Minutes of Exercise per Session: Not on file  ?Stress:  Not on file  ?Social Connections: Unknown  ? Frequency of Communication with Friends and Family: More than three times a week  ? Frequency of Social Gatherings with Friends and Family: More than three times a week  ? Attends Religious Services: Not on file  ? Active Member of Clubs or Organizations: Not on file  ? Attends Archivist Meetings: Not on file  ? Marital Status: Not on file  ? ? ? ?Review of Systems  ?Constitutional:  Negative for appetite change and unexpected weight change.  ?HENT:  Negative for congestion and sinus pressure.   ?Respiratory:  Negative for cough, chest tightness and shortness of breath.   ?Cardiovascular:  Negative for chest pain, palpitations and leg swelling.  ?Gastrointestinal:  Negative for abdominal pain, diarrhea, nausea and vomiting.  ?Genitourinary:  Negative for difficulty urinating and dysuria.  ?Musculoskeletal:  Negative for myalgias.  ?     Right heel pain.   ?Skin:  Negative for  color change and rash.  ?Neurological:  Negative for dizziness, light-headedness and headaches.  ?Psychiatric/Behavioral:  Negative for agitation and dysphoric mood.   ? ?   ?Objective:  ?  ? ?BP (!) 116/58 (BP Location: Left Arm, Patient Position: Sitting, Cuff Size: Small)   Pulse 70   Temp 98 ?F (36.7 ?C) (Temporal)   Resp 14   Ht '5\' 3"'$  (1.6 m)   Wt 138 lb 9.6 oz (62.9 kg)   SpO2 97%   BMI 24.55 kg/m?  ?Wt Readings from Last 3 Encounters:  ?07/04/21 138 lb 9.6 oz (62.9 kg)  ?03/28/21 141 lb 3.2 oz (64 kg)  ?12/18/20 142 lb (64.4 kg)  ? ? ?Physical Exam ?Vitals reviewed.  ?Constitutional:   ?   General: She is not in acute distress. ?   Appearance: Normal appearance.  ?HENT:  ?   Head: Normocephalic and atraumatic.  ?   Right Ear: External ear normal.  ?   Left Ear: External ear normal.  ?Eyes:  ?   General: No scleral icterus.    ?   Right eye: No discharge.     ?   Left eye: No discharge.  ?   Conjunctiva/sclera: Conjunctivae normal.  ?Neck:  ?   Thyroid: No thyromegaly.  ?Cardiovascular:  ?   Rate and Rhythm: Normal rate and regular rhythm.  ?Pulmonary:  ?   Effort: No respiratory distress.  ?   Breath sounds: Normal breath sounds. No wheezing.  ?Abdominal:  ?   General: Bowel sounds are normal.  ?   Palpations: Abdomen is soft.  ?   Tenderness: There is no abdominal tenderness.  ?Musculoskeletal:     ?   General: No swelling.  ?   Cervical back: Neck supple. No tenderness.  ?   Comments: Tenderness- right heel.   ?Lymphadenopathy:  ?   Cervical: No cervical adenopathy.  ?Skin: ?   Findings: No erythema or rash.  ?Neurological:  ?   Mental Status: She is alert.  ?Psychiatric:     ?   Mood and Affect: Mood normal.     ?   Behavior: Behavior normal.  ? ? ? ?Outpatient Encounter Medications as of 07/04/2021  ?Medication Sig  ? ACCU-CHEK SOFTCLIX LANCETS lancets USE ONCE A DAY  ? amLODipine (NORVASC) 10 MG tablet TAKE 1 TABLET BY MOUTH  DAILY  ? aspirin 81 MG tablet Take 81 mg by mouth daily.  ? atorvastatin  (LIPITOR) 20 MG tablet TAKE 1 TABLET BY MOUTH  DAILY  ?  Calcium Carbonate-Vitamin D 600-400 MG-UNIT tablet Take 1 tablet by mouth 2 (two) times daily.  ? CRANBERRY PO Take by mouth daily.  ? glucose blood (ACCU-CHEK AVIVA PLUS) test strip USE AS INSTRUCTED TO CHECK  BLOOD SUGAR ONCE DAILY AS  NEEDED  ? hydrochlorothiazide (HYDRODIURIL) 25 MG tablet TAKE 1 TABLET BY MOUTH  DAILY  ? Multiple Vitamins-Minerals (ICAPS AREDS 2) CAPS Take 1 capsule by mouth 2 (two) times daily.  ? Multiple Vitamins-Minerals (PRESERVISION AREDS 2+MULTI VIT PO) Take by mouth.  ? potassium chloride (KLOR-CON) 10 MEQ tablet Take 1 tablet (10 mEq total) by mouth 2 (two) times daily.  ? ramipril (ALTACE) 10 MG capsule TAKE 1 CAPSULE BY MOUTH  DAILY  ? traZODone (DESYREL) 50 MG tablet TAKE 1 TABLET BY MOUTH AT  BEDTIME AS NEEDED FOR SLEEP  ? ?No facility-administered encounter medications on file as of 07/04/2021.  ?  ? ?Lab Results  ?Component Value Date  ? WBC 7.1 06/25/2021  ? HGB 12.6 06/25/2021  ? HCT 38.7 06/25/2021  ? PLT 236.0 06/25/2021  ? GLUCOSE 128 (H) 07/04/2021  ? CHOL 166 06/25/2021  ? TRIG 143.0 06/25/2021  ? HDL 48.30 06/25/2021  ? [place] 89 06/25/2021  ? ALT 12 06/25/2021  ? AST 14 06/25/2021  ? NA 138 07/04/2021  ? K 3.8 07/04/2021  ? CL 102 07/04/2021  ? CREATININE 1.08 07/04/2021  ? BUN 21 07/04/2021  ? CO2 28 07/04/2021  ? TSH 1.37 06/25/2021  ? HGBA1C 7.5 (H) 06/25/2021  ? MICROALBUR 0.7 01/21/2021  ? ? ?MM 3D SCREEN BREAST BILATERAL ? ?Result Date: 01/22/2021 ?CLINICAL DATA:  Screening. EXAM: DIGITAL SCREENING BILATERAL MAMMOGRAM WITH TOMOSYNTHESIS AND CAD TECHNIQUE: Bilateral screening digital craniocaudal and mediolateral oblique mammograms were obtained. Bilateral screening digital breast tomosynthesis was performed. The images were evaluated with computer-aided detection. COMPARISON:  Previous exam(s). ACR Breast Density Category c: The breast tissue is heterogeneously dense, which may obscure small masses. FINDINGS:  There are no findings suspicious for malignancy. IMPRESSION: No mammographic evidence of malignancy. A result letter of this screening mammogram will be mailed directly to the patient. RECOMMENDATION: [place]

## 2021-07-05 LAB — URINALYSIS, ROUTINE W REFLEX MICROSCOPIC
Bilirubin Urine: NEGATIVE
Hgb urine dipstick: NEGATIVE
Leukocytes,Ua: NEGATIVE
Nitrite: NEGATIVE
RBC / HPF: NONE SEEN (ref 0–?)
Specific Gravity, Urine: 1.02 (ref 1.000–1.030)
Total Protein, Urine: NEGATIVE
Urine Glucose: NEGATIVE
Urobilinogen, UA: 0.2 (ref 0.0–1.0)
pH: 6 (ref 5.0–8.0)

## 2021-07-05 LAB — BASIC METABOLIC PANEL
BUN: 21 mg/dL (ref 6–23)
CO2: 28 mEq/L (ref 19–32)
Calcium: 9.9 mg/dL (ref 8.4–10.5)
Chloride: 102 mEq/L (ref 96–112)
Creatinine, Ser: 1.08 mg/dL (ref 0.40–1.20)
GFR: 46.14 mL/min — ABNORMAL LOW (ref >60.00)
Glucose, Bld: 128 mg/dL — ABNORMAL HIGH (ref 70–99)
Potassium: 3.8 mEq/L (ref 3.5–5.1)
Sodium: 138 mEq/L (ref 135–145)

## 2021-07-10 ENCOUNTER — Encounter: Payer: Self-pay | Admitting: Internal Medicine

## 2021-07-10 DIAGNOSIS — M79673 Pain in unspecified foot: Secondary | ICD-10-CM | POA: Insufficient documentation

## 2021-07-10 NOTE — Assessment & Plan Note (Signed)
Saw Dr Byrnett 01/29/21.  Mammogram 01/22/21 - Birads I.  Recommended continuing yearly mammogram.  

## 2021-07-10 NOTE — Assessment & Plan Note (Signed)
Has been followed by Dr Shah.  Stable.  

## 2021-07-10 NOTE — Assessment & Plan Note (Signed)
Right heel pain as outlined.  Refer to podiatry for further evaluation.  Discussed supports in shoe.   ?

## 2021-07-10 NOTE — Assessment & Plan Note (Signed)
Overall appears to be doing well.  Follow.  

## 2021-07-10 NOTE — Assessment & Plan Note (Signed)
Blood pressure as outlined.  Continue amlodipine, ramipril and hctz.  No changes made.  Follow pressures.  Follow metabolic panel.  

## 2021-07-10 NOTE — Assessment & Plan Note (Signed)
Follow met b and a1c.   ?Lab Results  ?Component Value Date  ? HGBA1C 7.5 (H) 06/25/2021  ? ?

## 2021-07-10 NOTE — Assessment & Plan Note (Signed)
On lipitor.  Low cholesterol diet and exercise.  Follow lipid panel and liver function tests.   

## 2021-07-11 ENCOUNTER — Ambulatory Visit (INDEPENDENT_AMBULATORY_CARE_PROVIDER_SITE_OTHER): Payer: Medicare Other

## 2021-07-11 VITALS — BP 133/60 | HR 64 | Ht 63.0 in | Wt 138.0 lb

## 2021-07-11 DIAGNOSIS — Z0000 Encounter for general adult medical examination without abnormal findings: Secondary | ICD-10-CM

## 2021-07-11 NOTE — Progress Notes (Signed)
Subjective:   Cynthia [name] is a 88 y.o. female who presents for Medicare Annual (Subsequent) preventive examination.  Review of Systems    No ROS.  Medicare Wellness Virtual Visit.  Visual/audio telehealth visit, UTA vital signs.   See social history for additional risk factors.   Cardiac Risk Factors include: advanced age (>75mn, >>15women)     Objective:    Today's Vitals   07/11/21 1035  BP: 133/60  Pulse: 64  Weight: 138 lb (62.6 kg)  Height: '5\' 3"'$  (1.6 m)   Body mass index is 24.45 kg/m.     07/11/2021   10:39 AM 07/10/2020   11:51 AM 07/04/2019    1:58 PM 07/01/2018    1:48 PM 06/23/2017    1:56 PM 05/20/2016    2:32 PM  Advanced Directives  Does Patient Have a Medical Advance Directive? Yes Yes Yes Yes Yes Yes  Type of AParamedicof [place] will [place] will [place]Living will Living will [place] will Living will;Healthcare Power of Attorney  Does patient want to make changes to medical advance directive? No - Patient declined No - Patient declined No - Patient declined No - Patient declined No - Patient declined No - Patient declined  Copy of HP[place]ontin Chart? No - copy requested No - copy requested No - copy requested  No - copy requested No - copy requested    Current Medications (verified) Outpatient Encounter Medications as of 07/11/2021  Medication Sig   ACCU-CHEK SOFTCLIX LANCETS lancets USE ONCE A DAY   amLODipine (NORVASC) 10 MG tablet TAKE 1 TABLET BY MOUTH  DAILY   aspirin 81 MG tablet Take 81 mg by mouth daily.   atorvastatin (LIPITOR) 20 MG tablet TAKE 1 TABLET BY MOUTH  DAILY   Calcium Carbonate-Vitamin D 600-400 MG-UNIT tablet Take 1 tablet by mouth 2 (two) times daily.   CRANBERRY PO Take by mouth daily.   glucose blood (ACCU-CHEK AVIVA PLUS) test strip USE AS INSTRUCTED TO CHECK  BLOOD SUGAR ONCE DAILY AS  NEEDED    hydrochlorothiazide (HYDRODIURIL) 25 MG tablet TAKE 1 TABLET BY MOUTH  DAILY   Multiple Vitamins-Minerals (ICAPS AREDS 2) CAPS Take 1 capsule by mouth 2 (two) times daily.   Multiple Vitamins-Minerals (PRESERVISION AREDS 2+MULTI VIT PO) Take by mouth.   potassium chloride (KLOR-CON) 10 MEQ tablet Take 1 tablet (10 mEq total) by mouth 2 (two) times daily.   ramipril (ALTACE) 10 MG capsule TAKE 1 CAPSULE BY MOUTH  DAILY   traZODone (DESYREL) 50 MG tablet TAKE 1 TABLET BY MOUTH AT  BEDTIME AS NEEDED FOR SLEEP   No facility-administered encounter medications on file as of 07/11/2021.    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 Resected margins negative. Whole breast radiation   [place]cer ([place] 12-15-12   left upper arm, malignant melanoma Clark level II, Breslow thickness: 0.35 mm.   Diabetes mellitus ([place]    Hypercholesterolemia    Hypertension    Myasthenia gravis ([place]    ocular   Personal history of radiation therapy 2014   Rt. Breast   S/P radiation therapy 2015   BREAST CA   Past Surgical History:  Procedure Laterality Date  BREAST BIOPSY Right 2014   DCIS   BREAST LUMPECTOMY Right 2014   Lumpectomy   BREAST SURGERY Right 2014   lumpectomy   EYE SURGERY     Caterac both eyes   Family History  Problem Relation Age of Onset   Skin cancer Father    Cancer Father        colon   Colon cancer Brother    Cancer Brother        colon   Breast cancer Other        niece   Skin cancer Sister    Social History   Socioeconomic History   Marital status: Divorced    Spouse name: Not on file   Number of children: Not on file   Years of education: Not on file   Highest education level: Not on file  Occupational History   Not on file  Tobacco Use   Smoking status: Never   Smokeless tobacco:  Never  Substance and Sexual Activity   Alcohol use: No    Alcohol/week: 0.0 standard drinks   Drug use: No   Sexual activity: Never  Other Topics Concern   Not on file  Social History Narrative   Not on file   Social Determinants of Health   Financial Resource Strain: Low Risk    Difficulty of Paying Living Expenses: Not hard at all  Food Insecurity: No Food Insecurity   Worried About Charity fundraiser in the Last Year: Never true   [place] in the Last Year: Never true  Transportation Needs: No Transportation Needs   Lack of Transportation (Medical): No   Lack of Transportation (Non-Medical): No  Physical Activity: Not on file  Stress: No Stress Concern Present   Feeling of Stress : Not at all  Social Connections: Unknown   Frequency of Communication with Friends and Family: More than three times a week   Frequency of Social Gatherings with Friends and Family: More than three times a week   Attends Religious Services: Not on Electrical engineer or Organizations: Not on file   Attends Archivist Meetings: Not on file   Marital Status: Not on file    Tobacco Counseling Counseling given: Not Answered   Clinical Intake:  Pre-visit preparation completed: Yes        Diabetes: Yes (Followed by PCP)  How often do you need to have someone help you when you read instructions, pamphlets, or other written materials from your doctor or pharmacy?: 1 - Never Interpreter Needed?: No    Activities of Daily Living    07/11/2021   10:42 AM  In your present state of health, do you have any difficulty performing the following activities:  Hearing? 0  Vision? 0  Difficulty concentrating or making decisions? 0  Walking or climbing stairs? 0  Dressing or bathing? 0  Doing errands, shopping? 0  Preparing Food and eating ? N  Using the Toilet? N  In the past six months, have you accidently leaked urine? N  Do you have problems with loss of bowel  control? N  Managing your Medications? N  Managing your Finances? N  Housekeeping or managing your Housekeeping? N    Patient Care Team: Einar Pheasant, MD as PCP - General (Internal Medicine) Bary Castilla, Forest Gleason, MD (General Surgery) Einar Pheasant, MD (Internal Medicine) Barrie Dunker, MD (Dermatology)  Indicate any recent Medical Services you may have received from other than  [facility] providers in the past year (date may be approximate).     Assessment:   This is a routine wellness examination for [place].  Virtual Visit via Telephone Note  I connected with  Karlene Lineman on 07/11/21 at 10:30 AM EDT by telephone and verified that I am speaking with the correct person using two identifiers.  Persons participating in the virtual visit: patient/Nurse Health Advisor   I discussed the limitations of performing an evaluation and management service by telehealth. We continued and completed visit with audio only. Some vital signs may be absent or patient reported.   Hearing/Vision screen Hearing Screening - Comments:: Patient is able to hear conversational tones without difficulty. No issues reported.  Vision Screening - Comments:: Followed by [facility] (Dr. Edison Pace) Wears corrective lenses They have regular follow up with the ophthalmologist  Dietary issues and exercise activities discussed: Current Exercise Habits: Home exercise routine, Intensity: Mild Healthy diet Good water intake   Goals Addressed               This Visit's Progress     Patient Stated     Good Water Intake (pt-stated)   On track     Monitor diet       Other     Maintain Healthy Lifestyle        Stay active  Healthy diet        Depression Screen    07/11/2021   10:38 AM 07/04/2021    2:31 PM 12/18/2020    3:06 PM 08/14/2020    2:20 PM 07/10/2020   11:26 AM 12/06/2019    2:09 PM 07/06/2019   11:41 AM  PHQ 2/9 Scores  PHQ - 2 Score 0 0 0 0 0 0 0  PHQ- 9 Score   0 0  0 0     Fall Risk    07/11/2021   10:42 AM 07/04/2021    2:31 PM 12/18/2020    3:06 PM 08/14/2020    2:40 PM 07/10/2020   11:57 AM  Fall Risk   Falls in the past year? 0 0 0 1 0  Number falls in past yr: 0  0 0 0  Injury with Fall?   0 0 0  Risk for fall due to :  No Fall Risks No Fall Risks History of fall(s)   Follow up Falls evaluation completed Falls evaluation completed Falls evaluation completed Falls evaluation completed Falls evaluation completed    Park: Home free of loose throw rugs in walkways, pet beds, electrical cords, etc? Yes  Adequate lighting in your home to reduce risk of falls? Yes   ASSISTIVE DEVICES UTILIZED TO PREVENT FALLS: Use of a cane, walker or w/c? No   TIMED UP AND GO: Was the test performed? No .   Cognitive Function:  Patient is alert and oriented x3.       07/11/2021   10:55 AM 07/10/2020   11:40 AM 07/04/2019    2:02 PM 07/01/2018    1:50 PM 06/23/2017    1:57 PM  6CIT Screen  What Year? 0 points 0 points  0 points 0 points  What month? 0 points 0 points  0 points 0 points  What time? 0 points 0 points  0 points 0 points  Count back from 20 0 points 0 points  0 points 0 points  Months in reverse 0 points 0 points 0 points 0 points 0 points  Repeat  phrase  0 points  0 points 0 points  Total Score  0 points  0 points 0 points    Immunizations Immunization History  Administered Date(s) Administered   Fluad Quad(high Dose 65+) 12/06/2019, 12/18/2020   Influenza Split 12/20/2011   Influenza, High Dose Seasonal PF 12/15/2016, 01/12/2018, 12/25/2018   Influenza,inj,Quad PF,6+ Mos 11/08/2012, 11/23/2013, 12/14/2014   Influenza-Unspecified 12/14/2014, 12/10/2015   PFIZER(Purple Top)SARS-COV-2 Vaccination 04/24/2019, 05/16/2019, 11/30/2019, 07/03/2020   Pneumococcal Conjugate-13 11/23/2013   Zoster Recombinat (Shingrix) 10/31/2020   TDAP status: Due, Education has been provided regarding the importance of this  vaccine. Advised may receive this vaccine at local pharmacy or Health Dept. Aware to provide a copy of the vaccination record if obtained from local pharmacy or Health Dept. Verbalized acceptance and understanding. Deferred.   Screening Tests Health Maintenance  Topic Date Due   FOOT EXAM  04/10/2021   COVID-19 Vaccine (5 - Booster for Pfizer series) 07/20/2021 (Originally 08/28/2020)   Zoster Vaccines- Shingrix (2 of 2) 10/04/2021 (Originally 12/26/2020)   Pneumonia Vaccine 57+ Years old (2 - PPSV23 if available, else PCV20) 07/05/2022 (Originally 11/24/2014)   TETANUS/TDAP  07/12/2022 (Originally 11/07/1950)   INFLUENZA VACCINE  09/24/2021   HEMOGLOBIN A1C  12/26/2021   MAMMOGRAM  01/21/2022   OPHTHALMOLOGY EXAM  07/11/2022   DEXA SCAN  Completed   HPV VACCINES  Aged Out   Health Maintenance Health Maintenance Due  Topic Date Due   FOOT EXAM  04/10/2021   Lung Cancer Screening: (Low Dose CT Chest recommended if Age 47-80 years, 30 pack-year currently smoking OR have quit w/in 15years.) does not qualify.   Hepatitis C Screening: does not qualify.  Vision Screening: Recommended annual ophthalmology exams for early detection of glaucoma and other disorders of the eye.  Dental Screening: Recommended annual dental exams for proper oral hygiene  Community Resource Referral / Chronic Care Management: CRR required this visit?  No   CCM required this visit?  No      Plan:   Keep all routine maintenance appointments.   I have personally reviewed and noted the following in the patient's chart:   Medical and social history Use of alcohol, tobacco or illicit drugs  Current medications and supplements including opioid prescriptions.  Functional ability and status Nutritional status Physical activity Advanced directives List of other physicians Hospitalizations, surgeries, and ER visits in previous 12 months Vitals Screenings to include cognitive, depression, and falls Referrals  and appointments  In addition, I have reviewed and discussed with patient certain preventive protocols, quality metrics, and best practice recommendations. A written personalized care plan for preventive services as well as general preventive health recommendations were provided to patient.     Varney Biles, LPN   7/82/4235

## 2021-07-11 NOTE — Patient Instructions (Addendum)
  Ms. Dykes , Thank you for taking time to come for your Medicare Wellness Visit. I appreciate your ongoing commitment to your health goals. Please review the following plan we discussed and let me know if I can assist you in the future.   These are the goals we discussed:  Goals       Patient Stated     Good Water Intake (pt-stated)      Monitor diet       Other     Maintain Healthy Lifestyle      Stay active  Healthy diet         This is a list of the screening recommended for you and due dates:  Health Maintenance  Topic Date Due   Complete foot exam   04/10/2021   COVID-19 Vaccine (5 - Booster for Pfizer series) 07/20/2021*   Zoster (Shingles) Vaccine (2 of 2) 10/04/2021*   Pneumonia Vaccine (2 - PPSV23 if available, else PCV20) 07/05/2022*   Tetanus Vaccine  07/12/2022*   Flu Shot  09/24/2021   Hemoglobin A1C  12/26/2021   Mammogram  01/21/2022   Eye exam for diabetics  07/11/2022   DEXA scan (bone density measurement)  Completed   HPV Vaccine  Aged Out  *Topic was postponed. The date shown is not the original due date.

## 2021-08-09 ENCOUNTER — Other Ambulatory Visit: Payer: Self-pay | Admitting: Internal Medicine

## 2021-10-17 ENCOUNTER — Other Ambulatory Visit: Payer: Self-pay | Admitting: Internal Medicine

## 2021-10-31 ENCOUNTER — Other Ambulatory Visit (INDEPENDENT_AMBULATORY_CARE_PROVIDER_SITE_OTHER): Payer: Medicare Other

## 2021-10-31 DIAGNOSIS — E1165 Type 2 diabetes mellitus with hyperglycemia: Secondary | ICD-10-CM

## 2021-10-31 DIAGNOSIS — E7800 Pure hypercholesterolemia, unspecified: Secondary | ICD-10-CM

## 2021-10-31 LAB — LIPID PANEL
Cholesterol: 147 mg/dL (ref 0–200)
HDL: 49.8 mg/dL (ref >39.00)
LDL Cholesterol: 72 mg/dL (ref 0–99)
NonHDL: 96.95
Total CHOL/HDL Ratio: 3
Triglycerides: 123 mg/dL (ref 0.0–149.0)
VLDL: 24.6 mg/dL (ref 0.0–40.0)

## 2021-10-31 LAB — BASIC METABOLIC PANEL
BUN: 12 mg/dL (ref 6–23)
CO2: 29 mEq/L (ref 19–32)
Calcium: 10 mg/dL (ref 8.4–10.5)
Chloride: 99 mEq/L (ref 96–112)
Creatinine, Ser: 0.97 mg/dL (ref 0.40–1.20)
GFR: 52.37 mL/min — ABNORMAL LOW (ref >60.00)
Glucose, Bld: 129 mg/dL — ABNORMAL HIGH (ref 70–99)
Potassium: 3.8 mEq/L (ref 3.5–5.1)
Sodium: 137 mEq/L (ref 135–145)

## 2021-10-31 LAB — HEPATIC FUNCTION PANEL
ALT: 14 U/L (ref 0–35)
AST: 16 U/L (ref 0–37)
Albumin: 3.6 g/dL (ref 3.5–5.2)
Alkaline Phosphatase: 91 U/L (ref 39–117)
Bilirubin, Direct: 0.1 mg/dL (ref 0.0–0.3)
Total Bilirubin: 0.6 mg/dL (ref 0.2–1.2)
Total Protein: 6.8 g/dL (ref 6.0–8.3)

## 2021-10-31 LAB — HEMOGLOBIN A1C: Hgb A1c MFr Bld: 7.8 % — ABNORMAL HIGH (ref 4.6–6.5)

## 2021-11-05 ENCOUNTER — Encounter: Payer: Self-pay | Admitting: Internal Medicine

## 2021-11-05 ENCOUNTER — Ambulatory Visit (INDEPENDENT_AMBULATORY_CARE_PROVIDER_SITE_OTHER): Payer: Medicare Other | Admitting: Internal Medicine

## 2021-11-05 ENCOUNTER — Telehealth: Payer: Self-pay

## 2021-11-05 VITALS — BP 124/62 | HR 74 | Temp 98.3°F | Resp 14 | Ht 63.0 in | Wt 136.6 lb

## 2021-11-05 DIAGNOSIS — I10 Essential (primary) hypertension: Secondary | ICD-10-CM

## 2021-11-05 DIAGNOSIS — E7800 Pure hypercholesterolemia, unspecified: Secondary | ICD-10-CM | POA: Diagnosis not present

## 2021-11-05 DIAGNOSIS — E1165 Type 2 diabetes mellitus with hyperglycemia: Secondary | ICD-10-CM

## 2021-11-05 DIAGNOSIS — Z23 Encounter for immunization: Secondary | ICD-10-CM | POA: Diagnosis not present

## 2021-11-05 DIAGNOSIS — F320 Major depressive disorder, single episode, mild: Secondary | ICD-10-CM

## 2021-11-05 DIAGNOSIS — G7000 Myasthenia gravis without (acute) exacerbation: Secondary | ICD-10-CM | POA: Diagnosis not present

## 2021-11-05 DIAGNOSIS — D0511 Intraductal carcinoma in situ of right breast: Secondary | ICD-10-CM

## 2021-11-05 MED ORDER — NYSTATIN 100000 UNIT/GM EX CREA: 1.0000 | TOPICAL_CREAM | Freq: Two times a day (BID) | CUTANEOUS | 0 refills | Status: DC

## 2021-11-05 NOTE — Telephone Encounter (Signed)
[facility]  [facility]. [facility] at [facility] and notify pt.

## 2021-11-05 NOTE — Telephone Encounter (Signed)
Patient states at check-out that she would like to know if Dr. Einar Pheasant thinks her upcoming appointment with Dr. Melrose Nakayama at [facility] will help her with being more steady on her feet.

## 2021-11-05 NOTE — Telephone Encounter (Signed)
Patient states she would like to be sure that we are going to send her cream to the [facility], not [facility].

## 2021-11-05 NOTE — Progress Notes (Signed)
Patient ID: Cynthia [name], female   DOB: Nov 25, 1931, 88 y.o.   MRN: [id number]   Subjective:    Patient ID: Cynthia [name], female    DOB: 10-01-1931, 88 y.o.   MRN: [id number]   Patient here for a scheduled follow up.   HPI Here to follow up regarding her blood pressure, cholesterol and diabetes.  Reports increased stress.  Sister just passed.  Discussed.  Increased depression related to this.  No SI.  Declines grief counseling.  No chest pain or sob reported.  No abdominal pain.  Eating.  Bowels stable.     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 Resected margins negative. Whole breast radiation   Cancer ([place]) 12-15-12   left upper arm, malignant melanoma Clark level II, Breslow thickness: 0.35 mm.   Diabetes mellitus ([place])    Hypercholesterolemia    Hypertension    Myasthenia gravis ([place])    ocular   Personal history of radiation therapy 2014   Rt. Breast   S/P radiation therapy 2015   BREAST CA   Past Surgical History:  Procedure Laterality Date   BREAST BIOPSY Right 2014   DCIS   BREAST LUMPECTOMY Right 2014   Lumpectomy   BREAST SURGERY Right 2014   lumpectomy   EYE SURGERY     Caterac both eyes   Family History  Problem Relation Age of Onset   Skin cancer Father    Cancer Father        colon   Colon cancer Brother    Cancer Brother        colon   Breast cancer Other        niece   Skin cancer Sister    Social History   Socioeconomic History   Marital status: Divorced    Spouse name: Not on file   Number of children: Not on file   Years of education: Not on file   Highest education level: Not on file  Occupational History   Not on file  Tobacco Use   Smoking status: Never   Smokeless tobacco: Never  Substance and Sexual Activity   Alcohol use: No    Alcohol/week: 0.0 standard drinks of alcohol   Drug use: No   Sexual  activity: Never  Other Topics Concern   Not on file  Social History Narrative   Not on file   Social Determinants of Health   Financial Resource Strain: Low Risk  (07/11/2021)   Overall Financial Resource Strain (CARDIA)    Difficulty of Paying Living Expenses: Not hard at all  Food Insecurity: No Food Insecurity (07/11/2021)   Hunger Vital Sign    Worried About Running Out of Food in the Last Year: Never true    [place] in the Last Year: Never true  Transportation Needs: No Transportation Needs (07/11/2021)   PRAPARE - Hydrologist (Medical): No    Lack of Transportation (Non-Medical): No  Physical Activity: Unknown (07/10/2020)   Exercise Vital Sign    Days of Exercise per Week: 0 days    Minutes of Exercise per Session: Not on file  Stress: No Stress Concern Present (07/11/2021)   [place]    Feeling of Stress : Not at all  Social  Connections: Unknown (07/11/2021)   Social Connection and Isolation Panel [NHANES]    Frequency of Communication with Friends and Family: More than three times a week    Frequency of Social Gatherings with Friends and Family: More than three times a week    Attends Religious Services: Not on Advertising copywriter or Organizations: Not on file    Attends Archivist Meetings: Not on file    Marital Status: Not on file     Review of Systems  Constitutional:  Negative for appetite change and unexpected weight change.  HENT:  Negative for congestion and sinus pressure.   Respiratory:  Negative for cough, chest tightness and shortness of breath.   Cardiovascular:  Negative for chest pain, palpitations and leg swelling.  Gastrointestinal:  Negative for abdominal pain, diarrhea, nausea and vomiting.  Genitourinary:  Negative for difficulty urinating and dysuria.  Musculoskeletal:  Negative for joint swelling and myalgias.  Skin:   Negative for color change and rash.  Neurological:  Negative for dizziness and headaches.  Psychiatric/Behavioral:  Positive for dysphoric mood. Negative for agitation.        Objective:     BP 124/62   Pulse 74   Temp 98.3 F (36.8 C) (Oral)   Resp 14   Ht _0  (1.6 m)   Wt 136 lb 9.6 oz (62 kg)   SpO2 96%   BMI 24.20 kg/m  Wt Readings from Last 3 Encounters:  11/05/21 136 lb 9.6 oz (62 kg)  07/11/21 138 lb (62.6 kg)  07/04/21 138 lb 9.6 oz (62.9 kg)    Physical Exam Vitals reviewed.  Constitutional:      General: She is not in acute distress.    Appearance: Normal appearance.  HENT:     Head: Normocephalic and atraumatic.     Right Ear: External ear normal.     Left Ear: External ear normal.  Eyes:     General: No scleral icterus.       Right eye: No discharge.        Left eye: No discharge.     Conjunctiva/sclera: Conjunctivae normal.  Neck:     Thyroid: No thyromegaly.  Cardiovascular:     Rate and Rhythm: Normal rate and regular rhythm.  Pulmonary:     Effort: No respiratory distress.     Breath sounds: Normal breath sounds. No wheezing.  Abdominal:     General: Bowel sounds are normal.     Palpations: Abdomen is soft.     Tenderness: There is no abdominal tenderness.  Musculoskeletal:        General: No swelling or tenderness.     Cervical back: Neck supple. No tenderness.  Lymphadenopathy:     Cervical: No cervical adenopathy.  Skin:    Findings: No erythema or rash.  Neurological:     Mental Status: She is alert.  Psychiatric:        Mood and Affect: Mood normal.        Behavior: Behavior normal.      Outpatient Encounter Medications as of 11/05/2021  Medication Sig   ACCU-CHEK SOFTCLIX LANCETS lancets USE ONCE A DAY   amLODipine (NORVASC) 10 MG tablet TAKE 1 TABLET BY MOUTH  DAILY   aspirin 81 MG tablet Take 81 mg by mouth daily.   atorvastatin (LIPITOR) 20 MG tablet TAKE 1 TABLET BY MOUTH ONCE DAILY   Calcium Carbonate-Vitamin D  600-400 MG-UNIT tablet Take 1 tablet by mouth  2 (two) times daily.   CRANBERRY PO Take by mouth daily.   glucose blood (ACCU-CHEK AVIVA PLUS) test strip USE AS INSTRUCTED TO CHECK  BLOOD SUGAR ONCE DAILY AS  NEEDED   hydrochlorothiazide (HYDRODIURIL) 25 MG tablet TAKE 1 TABLET BY MOUTH  DAILY   Multiple Vitamins-Minerals (ICAPS AREDS 2) CAPS Take 1 capsule by mouth 2 (two) times daily.   Multiple Vitamins-Minerals (PRESERVISION AREDS 2+MULTI VIT PO) Take by mouth.   potassium chloride (KLOR-CON) 10 MEQ tablet TAKE 1 TABLET BY MOUTH TWICE  DAILY   ramipril (ALTACE) 10 MG capsule TAKE 1 CAPSULE BY MOUTH  DAILY   traZODone (DESYREL) 50 MG tablet TAKE 1 TABLET BY MOUTH AT  BEDTIME AS NEEDED FOR SLEEP   [DISCONTINUED] nystatin cream (MYCOSTATIN) Apply 1 Application topically 2 (two) times daily.   No facility-administered encounter medications on file as of 11/05/2021.     Lab Results  Component Value Date   WBC 7.1 06/25/2021   HGB 12.6 06/25/2021   HCT 38.7 06/25/2021   PLT 236.0 06/25/2021   GLUCOSE 129 (H) 10/31/2021   CHOL 147 10/31/2021   TRIG 123.0 10/31/2021   HDL 49.80 10/31/2021   LDLCALC 72 10/31/2021   ALT 14 10/31/2021   AST 16 10/31/2021   NA 137 10/31/2021   K 3.8 10/31/2021   CL 99 10/31/2021   CREATININE 0.97 10/31/2021   BUN 12 10/31/2021   CO2 29 10/31/2021   TSH 1.37 06/25/2021   HGBA1C 7.8 (H) 10/31/2021   MICROALBUR 0.7 01/21/2021    MM 3D SCREEN BREAST BILATERAL  Result Date: 01/22/2021 CLINICAL DATA:  Screening. EXAM: DIGITAL SCREENING BILATERAL MAMMOGRAM WITH TOMOSYNTHESIS AND CAD TECHNIQUE: Bilateral screening digital craniocaudal and mediolateral oblique mammograms were obtained. Bilateral screening digital breast tomosynthesis was performed. The images were evaluated with computer-aided detection. COMPARISON:  Previous exam(s). ACR Breast Density Category c: The breast tissue is heterogeneously dense, which may obscure small masses. FINDINGS: There are  no findings suspicious for malignancy. IMPRESSION: No mammographic evidence of malignancy. A result letter of this screening mammogram will be mailed directly to the patient. RECOMMENDATION: Screening mammogram in one year. (Code:SM-B-01Y) BI-RADS CATEGORY  1: Negative. Electronically Signed   By: Dorise Bullion III M.D.   On: 01/22/2021 11:57      Assessment & Plan:   Problem List Items Addressed This Visit     DCIS (ductal carcinoma in situ) of breast    Saw Dr Bary Castilla 01/29/21.  Mammogram 01/22/21 - Birads I.  Recommended continuing yearly mammogram.       Depression, major, single episode, mild (HCC)    Increased stress and feeling down - sister recently passed.  Discussed.  Offered counseling.  She declines.  No SI.  Does not feel she needs any further intervention at this time.  Follow.       Diabetes mellitus ([place])    Follow met b and a1c.   Lab Results  Component Value Date   HGBA1C 7.8 (H) 10/31/2021       Relevant Orders   Hemoglobin A1c   Microalbumin / creatinine urine ratio   Hypercholesterolemia    On lipitor.  Low cholesterol diet and exercise.  Follow lipid panel and liver function tests.        Relevant Orders   Hepatic function panel   Lipid panel   Hypertension    Blood pressure as outlined.  Continue amlodipine, ramipril and hctz.  No changes made.  Follow pressures.  Follow metabolic panel.  Relevant Orders   Basic metabolic panel   Myasthenia gravis [facility])    Has been followed by Dr Manuella Ghazi.  Stable.       Other Visit Diagnoses     Need for immunization against influenza    -  Primary   Relevant Orders   Flu Vaccine QUAD High Dose(Fluad) (Completed)        Einar Pheasant, MD

## 2021-11-11 ENCOUNTER — Encounter: Payer: Self-pay | Admitting: Internal Medicine

## 2021-11-11 NOTE — Assessment & Plan Note (Signed)
Increased stress and feeling down - sister recently passed.  Discussed.  Offered counseling.  She declines.  No SI.  Does not feel she needs any further intervention at this time.  Follow.  

## 2021-11-11 NOTE — Assessment & Plan Note (Signed)
Blood pressure as outlined.  Continue amlodipine, ramipril and hctz.  No changes made.  Follow pressures.  Follow metabolic panel.  

## 2021-11-11 NOTE — Assessment & Plan Note (Signed)
Follow met b and a1c.   Lab Results  Component Value Date   HGBA1C 7.8 (H) 10/31/2021

## 2021-11-11 NOTE — Assessment & Plan Note (Signed)
Saw Dr Bary Castilla 01/29/21.  Mammogram 01/22/21 - Birads I.  Recommended continuing yearly mammogram.

## 2021-11-11 NOTE — Assessment & Plan Note (Signed)
On lipitor.  Low cholesterol diet and exercise.  Follow lipid panel and liver function tests.   

## 2021-11-11 NOTE — Assessment & Plan Note (Signed)
Has been followed by Dr Shah.  Stable.  

## 2021-11-14 ENCOUNTER — Ambulatory Visit: Payer: Medicare Other | Admitting: Dermatology

## 2021-11-14 DIAGNOSIS — C44712 Basal cell carcinoma of skin of right lower limb, including hip: Secondary | ICD-10-CM | POA: Diagnosis not present

## 2021-11-14 DIAGNOSIS — L578 Other skin changes due to chronic exposure to nonionizing radiation: Secondary | ICD-10-CM

## 2021-11-14 DIAGNOSIS — L304 Erythema intertrigo: Secondary | ICD-10-CM

## 2021-11-14 DIAGNOSIS — R21 Rash and other nonspecific skin eruption: Secondary | ICD-10-CM | POA: Diagnosis not present

## 2021-11-14 DIAGNOSIS — L820 Inflamed seborrheic keratosis: Secondary | ICD-10-CM | POA: Diagnosis not present

## 2021-11-14 DIAGNOSIS — Z853 Personal history of malignant neoplasm of breast: Secondary | ICD-10-CM

## 2021-11-14 DIAGNOSIS — C44319 Basal cell carcinoma of skin of other parts of face: Secondary | ICD-10-CM

## 2021-11-14 DIAGNOSIS — L570 Actinic keratosis: Secondary | ICD-10-CM

## 2021-11-14 DIAGNOSIS — L905 Scar conditions and fibrosis of skin: Secondary | ICD-10-CM

## 2021-11-14 DIAGNOSIS — C4491 Basal cell carcinoma of skin, unspecified: Secondary | ICD-10-CM

## 2021-11-14 DIAGNOSIS — D229 Melanocytic nevi, unspecified: Secondary | ICD-10-CM

## 2021-11-14 DIAGNOSIS — D1801 Hemangioma of skin and subcutaneous tissue: Secondary | ICD-10-CM

## 2021-11-14 DIAGNOSIS — D692 Other nonthrombocytopenic purpura: Secondary | ICD-10-CM

## 2021-11-14 DIAGNOSIS — L929 Granulomatous disorder of the skin and subcutaneous tissue, unspecified: Secondary | ICD-10-CM

## 2021-11-14 DIAGNOSIS — Z1283 Encounter for screening for malignant neoplasm of skin: Secondary | ICD-10-CM | POA: Diagnosis not present

## 2021-11-14 DIAGNOSIS — L821 Other seborrheic keratosis: Secondary | ICD-10-CM

## 2021-11-14 DIAGNOSIS — D485 Neoplasm of uncertain behavior of skin: Secondary | ICD-10-CM

## 2021-11-14 DIAGNOSIS — L814 Other melanin hyperpigmentation: Secondary | ICD-10-CM

## 2021-11-14 DIAGNOSIS — Z79899 Other long term (current) drug therapy: Secondary | ICD-10-CM

## 2021-11-14 HISTORY — DX: Basal cell carcinoma of skin, unspecified: C44.91

## 2021-11-14 MED ORDER — TRIAMCINOLONE ACETONIDE 0.1 % EX CREA: 1.0000 | TOPICAL_CREAM | Freq: Every day | CUTANEOUS | 1 refills | Status: DC | PRN

## 2021-11-14 MED ORDER — KETOCONAZOLE 2 % EX CREA: 1.0000 | TOPICAL_CREAM | Freq: Every day | CUTANEOUS | 6 refills | Status: AC

## 2021-11-14 NOTE — Patient Instructions (Signed)
Cryotherapy Aftercare  Wash gently with soap and water everyday.   Apply Vaseline and Band-Aid daily until healed.  Wound Care Instructions  Cleanse wound gently with soap and water once a day then pat dry with clean gauze. Apply a thin coat of Petrolatum (petroleum jelly, "Vaseline") over the wound (unless you have an allergy to this). We recommend that you use a new, sterile tube of Vaseline. Do not pick or remove scabs. Do not remove the yellow or white "healing tissue" from the base of the wound.  Cover the wound with fresh, clean, nonstick gauze and secure with paper tape. You may use Band-Aids in place of gauze and tape if the wound is small enough, but would recommend trimming much of the tape off as there is often too much. Sometimes Band-Aids can irritate the skin.  You should call the office for your biopsy report after 1 week if you have not already been contacted.  If you experience any problems, such as abnormal amounts of bleeding, swelling, significant bruising, significant pain, or evidence of infection, please call the office immediately.  FOR ADULT SURGERY PATIENTS: If you need something for pain relief you may take 1 extra strength Tylenol (acetaminophen) AND 2 Ibuprofen (200mg each) together every 4 hours as needed for pain. (do not take these if you are allergic to them or if you have a reason you should not take them.) Typically, you may only need pain medication for 1 to 3 days.      Due to recent changes in healthcare laws, you may see results of your pathology and/or laboratory studies on MyChart before the doctors have had a chance to review them. We understand that in some cases there may be results that are confusing or concerning to you. Please understand that not all results are received at the same time and often the doctors may need to interpret multiple results in order to provide you with the best plan of care or course of treatment. Therefore, we ask that you  please give us 2 business days to thoroughly review all your results before contacting the office for clarification. Should we see a critical lab result, you will be contacted sooner.   If You Need Anything After Your Visit  If you have any questions or concerns for your doctor, please call our main line at [phone] and press option 4 to reach your doctor's medical assistant. If no one answers, please leave a voicemail as directed and we will return your call as soon as possible. Messages left after 4 pm will be answered the following business day.   You may also send us a message via MyChart. We typically respond to MyChart messages within 1-2 business days.  For prescription refills, please ask your pharmacy to contact our office. Our fax number is [phone].  If you have an urgent issue when the clinic is closed that cannot wait until the next business day, you can page your doctor at the number below.    Please note that while we do our best to be available for urgent issues outside of office hours, we are not available 24/7.   If you have an urgent issue and are unable to reach us, you may choose to seek medical care at your doctor's office, retail clinic, urgent care center, or emergency room.  If you have a medical emergency, please immediately call 911 or go to the emergency department.  Pager Numbers  - Dr. Kowalski: [phone]  -   Dr. Moye: [phone]  - Dr. Stewart: [phone]  In the event of inclement weather, please call our main line at [phone] for an update on the status of any delays or closures.  Dermatology Medication Tips: Please keep the boxes that topical medications come in in order to help keep track of the instructions about where and how to use these. Pharmacies typically print the medication instructions only on the boxes and not directly on the medication tubes.   If your medication is too expensive, please contact our office at  [phone] option 4 or send us a message through MyChart.   We are unable to tell what your co-pay for medications will be in advance as this is different depending on your insurance coverage. However, we may be able to find a substitute medication at lower cost or fill out paperwork to get insurance to cover a needed medication.   If a prior authorization is required to get your medication covered by your insurance company, please allow us 1-2 business days to complete this process.  Drug prices often vary depending on where the prescription is filled and some pharmacies may offer cheaper prices.  The website www.goodrx.com contains coupons for medications through different pharmacies. The prices here do not account for what the cost may be with help from insurance (it may be cheaper with your insurance), but the website can give you the price if you did not use any insurance.  - You can print the associated coupon and take it with your prescription to the pharmacy.  - You may also stop by our office during regular business hours and pick up a GoodRx coupon card.  - If you need your prescription sent electronically to a different pharmacy, notify our office through [place] MyChart or by phone at [phone] option 4.     Si Usted Necesita Algo Despus de Su Visita  Tambin puede enviarnos un mensaje a travs de MyChart. Por lo general respondemos a los mensajes de MyChart en el transcurso de 1 a 2 das hbiles.  Para renovar recetas, por favor pida a su farmacia que se ponga en contacto con nuestra oficina. Nuestro nmero de fax es el [phone].  Si tiene un asunto urgente cuando la clnica est cerrada y que no puede esperar hasta el siguiente da hbil, puede llamar/localizar a su doctor(a) al nmero que aparece a continuacin.   Por favor, tenga en cuenta que aunque hacemos todo lo posible para estar disponibles para asuntos urgentes fuera del horario de oficina, no estamos  disponibles las 24 horas del da, los 7 das de la semana.   Si tiene un problema urgente y no puede comunicarse con nosotros, puede optar por buscar atencin mdica  en el consultorio de su doctor(a), en una clnica privada, en un centro de atencin urgente o en una sala de emergencias.  Si tiene una emergencia mdica, por favor llame inmediatamente al 911 o vaya a la sala de emergencias.  Nmeros de bper  - Dr. Kowalski: [phone]  - Dra. Moye: [phone]  - Dra. Stewart: [phone]  En caso de inclemencias del tiempo, por favor llame a nuestra lnea principal al [phone] para una actualizacin sobre el estado de cualquier retraso o cierre.  Consejos para la medicacin en dermatologa: Por favor, guarde las cajas en las que vienen los medicamentos de uso tpico para ayudarle a seguir las instrucciones sobre dnde y cmo usarlos. Las farmacias generalmente imprimen las instrucciones del medicamento slo en las cajas y   no directamente en los tubos del medicamento.   Si su medicamento es muy caro, por favor, pngase en contacto con nuestra oficina llamando al [phone] y presione la opcin 4 o envenos un mensaje a travs de MyChart.   No podemos decirle cul ser su copago por los medicamentos por adelantado ya que esto es diferente dependiendo de la cobertura de su seguro. Sin embargo, es posible que podamos encontrar un medicamento sustituto a menor costo o llenar un formulario para que el seguro cubra el medicamento que se considera necesario.   Si se requiere una autorizacin previa para que su compaa de seguros cubra su medicamento, por favor permtanos de 1 a 2 das hbiles para completar este proceso.  Los precios de los medicamentos varan con frecuencia dependiendo del lugar de dnde se surte la receta y alguna farmacias pueden ofrecer precios ms baratos.  El sitio web www.goodrx.com tiene cupones para medicamentos de diferentes farmacias. Los precios aqu no  tienen en cuenta lo que podra costar con la ayuda del seguro (puede ser ms barato con su seguro), pero el sitio web puede darle el precio si no utiliz ningn seguro.  - Puede imprimir el cupn correspondiente y llevarlo con su receta a la farmacia.  - Tambin puede pasar por nuestra oficina durante el horario de atencin regular y recoger una tarjeta de cupones de GoodRx.  - Si necesita que su receta se enve electrnicamente a una farmacia diferente, informe a nuestra oficina a travs de MyChart de [place] o por telfono llamando al [phone] y presione la opcin 4.  

## 2021-11-14 NOTE — Progress Notes (Signed)
New Patient Visit  Subjective  Cynthia [name] is a 88 y.o. female who presents for the following: Other (New patient - spots on chest. The patient presents for Total-Body Skin Exam (TBSE) for skin cancer screening and mole check.  The patient has spots, moles and lesions to be evaluated, some may be new or changing and the patient has concerns that these could be cancer./).  The following portions of the chart were reviewed this encounter and updated as appropriate:   Tobacco  Allergies  Meds  Problems  Med Hx  Surg Hx  Fam Hx     Review of Systems:  No other skin or systemic complaints except as noted in HPI or Assessment and Plan.  Objective  Well appearing patient in no apparent distress; mood and affect are within normal limits.  A full examination was performed including scalp, head, eyes, ears, nose, lips, neck, chest, axillae, abdomen, back, buttocks, bilateral upper extremities, bilateral lower extremities, hands, feet, fingers, toes, fingernails, and toenails. All findings within normal limits unless otherwise noted below.  Clear today  Right nose x 1, right forehead x 1 (2) Erythematous thin papules/macules with gritty scale.   Right Breast Clear today. No lymphadenopathy  Right medial forehead above brow 1.0 cm pearly papule     Right knee 1.0 cm hyperkeratotic papule     Right forearm Indurated plaque     Right Inframammary Fold Pinkness  Left chest (2) Erythematous stuck-on, waxy papule or plaque   Assessment & Plan  AK (actinic keratosis) (2) Right nose x 1, right forehead x 1 Destruction of lesion - Right nose x 1, right forehead x 1 Complexity: simple   Destruction method: cryotherapy   Informed consent: discussed and consent obtained   Timeout:  patient name, date of birth, surgical site, and procedure verified Lesion destroyed using liquid nitrogen: Yes   Region frozen until ice ball extended beyond lesion: Yes   Outcome:  patient tolerated procedure well with no complications   Post-procedure details: wound care instructions given    History of breast cancer Right Breast Clear.  No lymphadenopathy.  Observe for recurrence. Call clinic for new or changing lesions.  Recommend regular skin exams, daily broad-spectrum spf 30+ sunscreen use, and photoprotection.     Neoplasm of uncertain behavior of skin (3) Right medial forehead above brow Epidermal / dermal shaving  Lesion diameter (cm):  1 Informed consent: discussed and consent obtained   Timeout: patient name, date of birth, surgical site, and procedure verified   Procedure prep:  Patient was prepped and draped in usual sterile fashion Prep type:  Isopropyl alcohol Anesthesia: the lesion was anesthetized in a standard fashion   Anesthetic:  1% lidocaine w/ epinephrine 1-100,000 buffered w/ 8.4% NaHCO3 Instrument used: flexible razor blade   Hemostasis achieved with: pressure, aluminum chloride and electrodesiccation   Outcome: patient tolerated procedure well   Post-procedure details: sterile dressing applied and wound care instructions given   Dressing type: bandage and petrolatum    Destruction of lesion Complexity: extensive   Destruction method: electrodesiccation and curettage   Informed consent: discussed and consent obtained   Timeout:  patient name, date of birth, surgical site, and procedure verified Procedure prep:  Patient was prepped and draped in usual sterile fashion Prep type:  Isopropyl alcohol Anesthesia: the lesion was anesthetized in a standard fashion   Anesthetic:  1% lidocaine w/ epinephrine 1-100,000 buffered w/ 8.4% NaHCO3 Curettage performed in three different directions: Yes  Electrodesiccation performed over the curetted area: Yes   Lesion length (cm):  1 Lesion width (cm):  1 Margin per side (cm):  0.2 Final wound size (cm):  1.4 Hemostasis achieved with:  pressure and aluminum chloride Outcome: patient tolerated  procedure well with no complications   Post-procedure details: sterile dressing applied and wound care instructions given   Dressing type: bandage and petrolatum    Specimen 1 - Surgical pathology Differential Diagnosis: BCC vs other  Check Margins: No EDC today  Right knee Epidermal / dermal shaving  Lesion diameter (cm):  1.1 Informed consent: discussed and consent obtained   Timeout: patient name, date of birth, surgical site, and procedure verified   Procedure prep:  Patient was prepped and draped in usual sterile fashion Prep type:  Isopropyl alcohol Anesthesia: the lesion was anesthetized in a standard fashion   Anesthetic:  1% lidocaine w/ epinephrine 1-100,000 buffered w/ 8.4% NaHCO3 Instrument used: flexible razor blade   Hemostasis achieved with: pressure, aluminum chloride and electrodesiccation   Outcome: patient tolerated procedure well   Post-procedure details: sterile dressing applied and wound care instructions given   Dressing type: bandage and petrolatum    Destruction of lesion Complexity: extensive   Destruction method: electrodesiccation and curettage   Informed consent: discussed and consent obtained   Timeout:  patient name, date of birth, surgical site, and procedure verified Procedure prep:  Patient was prepped and draped in usual sterile fashion Prep type:  Isopropyl alcohol Anesthesia: the lesion was anesthetized in a standard fashion   Anesthetic:  1% lidocaine w/ epinephrine 1-100,000 buffered w/ 8.4% NaHCO3 Curettage performed in three different directions: Yes   Electrodesiccation performed over the curetted area: Yes   Lesion length (cm):  1.1 Lesion width (cm):  1.1 Margin per side (cm):  0.2 Final wound size (cm):  1.5 Hemostasis achieved with:  pressure and aluminum chloride Outcome: patient tolerated procedure well with no complications   Post-procedure details: sterile dressing applied and wound care instructions given   Dressing type:  bandage and petrolatum    Specimen 2 - Surgical pathology Differential Diagnosis: SCC vs other  Check Margins: No EDC today  Right forearm Skin / nail biopsy Type of biopsy: tangential   Informed consent: discussed and consent obtained   Timeout: patient name, date of birth, surgical site, and procedure verified   Procedure prep:  Patient was prepped and draped in usual sterile fashion Prep type:  Isopropyl alcohol Anesthesia: the lesion was anesthetized in a standard fashion   Anesthetic:  1% lidocaine w/ epinephrine 1-100,000 buffered w/ 8.4% NaHCO3 Instrument used: flexible razor blade   Hemostasis achieved with: pressure, aluminum chloride and electrodesiccation   Outcome: patient tolerated procedure well   Post-procedure details: sterile dressing applied and wound care instructions given   Dressing type: bandage and petrolatum    Specimen 3 - Surgical pathology Differential Diagnosis: Scar vs other Check Margins: No  Erythema intertrigo Right Inframammary Fold Chronic and persistent condition with duration or expected duration over one year. Condition is symptomatic / bothersome to patient. Not to goal.  Intertrigo is a chronic recurrent rash that occurs in skin fold areas that may be associated with friction; heat; moisture; yeast; fungus; and bacteria.  It is exacerbated by increased movement / activity; sweating; and higher atmospheric temperature.  Ketoconazole 2% cream qd  ketoconazole (NIZORAL) 2 % cream - Right Inframammary Fold Apply 1 Application topically at bedtime.  Rash History of insect bites -  Clear today [facility]  0.1% cream qd prn insect bites  triamcinolone cream (KENALOG) 0.1 % Apply 1 Application topically daily as needed.  Inflamed seborrheic keratosis (2) Left chest Destruction of lesion - Left chest Complexity: simple   Destruction method: cryotherapy   Informed consent: discussed and consent obtained   Timeout:  patient name, date of birth,  surgical site, and procedure verified Lesion destroyed using liquid nitrogen: Yes   Region frozen until ice ball extended beyond lesion: Yes   Outcome: patient tolerated procedure well with no complications   Post-procedure details: wound care instructions given    Purpura - Chronic; persistent and recurrent.  Treatable, but not curable. - Violaceous macules and patches - Benign - Related to trauma, age, sun damage and/or use of blood thinners, chronic use of topical and/or oral steroids - Observe - Can use OTC arnica containing moisturizer such as Dermend Bruise Formula if desired - Call for worsening or other concerns  Lentigines - Scattered tan macules - Due to sun exposure - Benign-appearing, observe - Recommend daily broad spectrum sunscreen SPF 30+ to sun-exposed areas, reapply every 2 hours as needed. - Call for any changes  Seborrheic Keratoses - Stuck-on, waxy, tan-brown papules and/or plaques  - Benign-appearing - Discussed benign etiology and prognosis. - Observe - Call for any changes  Melanocytic Nevi - Tan-brown and/or pink-flesh-colored symmetric macules and papules - Benign appearing on exam today - Observation - Call clinic for new or changing moles - Recommend daily use of broad spectrum spf 30+ sunscreen to sun-exposed areas.   Hemangiomas - Red papules - Discussed benign nature - Observe - Call for any changes  Actinic Damage - Chronic condition, secondary to cumulative UV/sun exposure - diffuse scaly erythematous macules with underlying dyspigmentation - Recommend daily broad spectrum sunscreen SPF 30+ to sun-exposed areas, reapply every 2 hours as needed.  - Staying in the shade or wearing long sleeves, sun glasses (UVA+UVB protection) and wide brim hats (4-inch brim around the entire circumference of the hat) are also recommended for sun protection.  - Call for new or changing lesions.  Skin cancer screening performed today.  Return in about 6  months (around 05/15/2022) for Biopsy Follow up.  I, Ashok Cordia, CMA, am acting as scribe for Sarina Ser, MD . Documentation: I have reviewed the above documentation for accuracy and completeness, and I agree with the above.  Sarina Ser, MD

## 2021-11-19 ENCOUNTER — Telehealth: Payer: Self-pay

## 2021-11-19 ENCOUNTER — Encounter: Payer: Self-pay | Admitting: Dermatology

## 2021-11-19 NOTE — Telephone Encounter (Signed)
-----   Message from Ralene Bathe, MD sent at 11/18/2021  6:06 PM EDT ----- Diagnosis 1. Skin , right medial forehead above brow BASAL CELL CARCINOMA, NODULAR PATTERN 2. Skin , right knee BASAL CELL CARCINOMA, NODULAR PATTERN, ULCERATED 3. Skin , right forearm DERMAL SCAR AND KERATIN GRANULOMA  1&2 - both cancer = BCC Both already treated Recheck next visit 3- benign scar with cyst No further treatment needed

## 2021-11-19 NOTE — Telephone Encounter (Signed)
Patient informed of pathology results 

## 2022-01-20 ENCOUNTER — Other Ambulatory Visit: Payer: Self-pay | Admitting: Internal Medicine

## 2022-01-21 NOTE — Telephone Encounter (Signed)
Rx ok'd for trazodone #90 with one refill.

## 2022-02-05 ENCOUNTER — Other Ambulatory Visit: Payer: Self-pay | Admitting: Family Medicine

## 2022-02-05 ENCOUNTER — Telehealth: Payer: Self-pay | Admitting: Internal Medicine

## 2022-02-05 NOTE — Telephone Encounter (Signed)
Lm to advise pt can drop off urine here for testing, but needs to schedule virtual with Dr Maudie Mercury for tomorrow

## 2022-02-05 NOTE — Telephone Encounter (Signed)
Will need visit to treat urine.

## 2022-02-05 NOTE — Telephone Encounter (Signed)
Patient starting she has a UTI .Wanting to come in to give urine for testing.

## 2022-02-05 NOTE — Telephone Encounter (Signed)
S/w pt - stated had some burning on urination this morning. Afraid UTI brewing  No fever, chills, abd or back pain/pressure   Wants to leave urine to check for UTI

## 2022-02-06 ENCOUNTER — Encounter: Payer: Self-pay | Admitting: Family Medicine

## 2022-02-06 ENCOUNTER — Ambulatory Visit (INDEPENDENT_AMBULATORY_CARE_PROVIDER_SITE_OTHER): Payer: Medicare Other | Admitting: Family Medicine

## 2022-02-06 VITALS — BP 120/60 | HR 63 | Temp 97.0°F | Ht 63.0 in | Wt 134.4 lb

## 2022-02-06 DIAGNOSIS — R399 Unspecified symptoms and signs involving the genitourinary system: Secondary | ICD-10-CM | POA: Diagnosis not present

## 2022-02-06 LAB — POCT URINALYSIS DIPSTICK
Bilirubin, UA: NEGATIVE
Glucose, UA: NEGATIVE
Ketones, UA: NEGATIVE
Nitrite, UA: NEGATIVE
Protein, UA: POSITIVE — AB
Spec Grav, UA: <=1.005 — AB (ref 1.010–1.025)
Urobilinogen, UA: 0.2 E.U./dL
pH, UA: 5 (ref 5.0–8.0)

## 2022-02-06 MED ORDER — CEPHALEXIN 250 MG PO CAPS: 250.0000 mg | ORAL_CAPSULE | Freq: Four times a day (QID) | ORAL | 0 refills | Status: DC

## 2022-02-06 MED ORDER — SACCHAROMYCES BOULARDII 250 MG PO CAPS: 250.0000 mg | ORAL_CAPSULE | Freq: Every day | ORAL | 0 refills | Status: DC

## 2022-02-06 NOTE — Addendum Note (Signed)
Addended by: Lanice Shirts on: 02/06/2022 01:32 PM   Modules accepted: Orders

## 2022-02-06 NOTE — Telephone Encounter (Signed)
Pt seeing dr Volanda Napoleon this afternoon

## 2022-02-06 NOTE — Patient Instructions (Addendum)
It was a pleasure meeting you today. Thank you for allowing me to take part in your health care.  Our goals for today as we discussed include:  Your urine shows beginning of urine infection   Start Keflex 250 mg 1 tablet 4 times a day. Take probiotic 1 tablet daily while on antibiotics and for 2 weeks after completion of antibiotics.    If you have any questions or concerns, please do not hesitate to call the office at [phone].  I look forward to our next visit and until then take care and stay safe.  Regards,   Carollee Leitz, MD   [facility]

## 2022-02-06 NOTE — Progress Notes (Addendum)
   SUBJECTIVE:   Chief Complaint  Patient presents with   Dysuria    Urinary burning x 3 days   HPI Urinary Tract Infection: Patient complains of dysuria She has had symptoms for 3 days.  Patient denies back pain, fever, vaginal discharge, and hematuria . Patient does not have a history of recurrent UTI.  Patient does not have a history of pyelonephritis.   PERTINENT PMH / PSH: HTN DM Type 2   OBJECTIVE:  BP 120/60   Pulse 63   Temp (!) 97 F (36.1 C) (Oral)   Ht '5\' 3"'$  (1.6 m)   Wt 134 lb 6.4 oz (61 kg)   SpO2 96%   BMI 23.81 kg/m    Physical Exam Vitals reviewed.  Constitutional:      General: She is not in acute distress.    Appearance: Normal appearance. She is not ill-appearing, toxic-appearing or diaphoretic.  Eyes:     Conjunctiva/sclera: Conjunctivae normal.  Cardiovascular:     Pulses: Normal pulses.  Pulmonary:     Effort: Pulmonary effort is normal.  Abdominal:     General: Abdomen is flat. Bowel sounds are normal. There is no distension.     Palpations: Abdomen is soft. There is no mass.     Tenderness: There is no abdominal tenderness. There is no right CVA tenderness, left CVA tenderness, guarding or rebound.  Neurological:     Mental Status: She is alert. Mental status is at baseline.     ASSESSMENT/PLAN:  Urinary tract infection symptoms -     Urine Culture -     POCT urinalysis dipstick -     Cephalexin; Take 1 capsule (250 mg total) by mouth 4 (four) times daily for 5 days.  Dispense: 20 capsule; Refill: 0 -     Saccharomyces boulardii; Take 1 capsule (250 mg total) by mouth daily.  Dispense: 90 capsule; Refill: 0    PDMP reviewed   Return if symptoms worsen or fail to improve.  Carollee Leitz, MD

## 2022-02-08 LAB — URINE CULTURE
MICRO NUMBER:: 14315956
SPECIMEN QUALITY:: ADEQUATE

## 2022-02-08 MED ORDER — CEFDINIR 300 MG PO CAPS: 300.0000 mg | ORAL_CAPSULE | Freq: Two times a day (BID) | ORAL | 0 refills | Status: AC

## 2022-02-08 NOTE — Progress Notes (Signed)
Urine culture positive for E. coli.  Not sensitive to cefazolin.  Sensitive to cefepime.  Plan to discontinue Keflex and start Cefdinir 300 mg twice daily for 5 days.  Continue probiotics daily.  Called patient at [phone] x 2 to inform of results.  No answer and unable to leave voicemail. Called alternate number [phone] x 2 and was unable to reach patient.   Will need to call patient to inform of change  Carollee Leitz, MD

## 2022-02-08 NOTE — Addendum Note (Signed)
Addended by: Lanice Shirts on: 02/08/2022 02:49 PM   Modules accepted: Orders

## 2022-02-08 NOTE — Progress Notes (Signed)
Call patient to discontinue Keflex,  Start Cefdinir 300 mg two times a day for 5 days.  Continue Probiotics.    Prescription has been sent to [place]

## 2022-02-10 ENCOUNTER — Telehealth: Payer: Self-pay | Admitting: Internal Medicine

## 2022-02-10 NOTE — Telephone Encounter (Signed)
I called patient and alternate number both x 2 on Dec 16 to inform of change.  I had also discussed with patient during visit that there was a possibility of an antibiotic change once the results of the culture resulted and would notify her.   Unfortunately she did not answer and I sent in the prescription hoping she would get it and start as we had discussed a possible change during visit.

## 2022-02-10 NOTE — Telephone Encounter (Signed)
Pt called stating she would like the [facility] of [facility] to call her. Pt did not tell me what it was about

## 2022-02-10 NOTE — Telephone Encounter (Signed)
Called Patient like the message states but the Patient wants to speak with Dr. Volanda Napoleon not me. I had already called the Patient one time this morning and let her know about her urine culture and the medication change. I educated her on the change for 10- 15 minutes then she stated she got it. Now the Patient is calling back and would like to speak to Dr. Volanda Napoleon. Patient is also upset because no one called her on Saturday to let her know about the medication change and that it was ready. The Pharmacy told her the medication was called in 2 days ago.

## 2022-02-11 ENCOUNTER — Telehealth: Payer: Self-pay

## 2022-02-11 NOTE — Telephone Encounter (Signed)
Patient called back & states she does not know why we were calling her because she is doing as Dr. Volanda Napoleon advised and that she does not have any questions or concerns at this time.

## 2022-02-11 NOTE — Telephone Encounter (Signed)
Left message to call back  

## 2022-02-17 ENCOUNTER — Other Ambulatory Visit: Payer: Self-pay | Admitting: Internal Medicine

## 2022-03-03 ENCOUNTER — Other Ambulatory Visit (INDEPENDENT_AMBULATORY_CARE_PROVIDER_SITE_OTHER): Payer: Medicare Other

## 2022-03-03 DIAGNOSIS — I10 Essential (primary) hypertension: Secondary | ICD-10-CM

## 2022-03-03 DIAGNOSIS — E7800 Pure hypercholesterolemia, unspecified: Secondary | ICD-10-CM

## 2022-03-03 DIAGNOSIS — E1165 Type 2 diabetes mellitus with hyperglycemia: Secondary | ICD-10-CM | POA: Diagnosis not present

## 2022-03-03 LAB — LIPID PANEL
Cholesterol: 156 mg/dL (ref 0–200)
HDL: 47.1 mg/dL (ref >39.00)
LDL Cholesterol: 84 mg/dL (ref 0–99)
NonHDL: 108.55
Total CHOL/HDL Ratio: 3
Triglycerides: 124 mg/dL (ref 0.0–149.0)
VLDL: 24.8 mg/dL (ref 0.0–40.0)

## 2022-03-03 LAB — BASIC METABOLIC PANEL
BUN: 19 mg/dL (ref 6–23)
CO2: 30 mEq/L (ref 19–32)
Calcium: 10.1 mg/dL (ref 8.4–10.5)
Chloride: 102 mEq/L (ref 96–112)
Creatinine, Ser: 0.92 mg/dL (ref 0.40–1.20)
GFR: 55.67 mL/min — ABNORMAL LOW (ref >60.00)
Glucose, Bld: 121 mg/dL — ABNORMAL HIGH (ref 70–99)
Potassium: 3.7 mEq/L (ref 3.5–5.1)
Sodium: 141 mEq/L (ref 135–145)

## 2022-03-03 LAB — MICROALBUMIN / CREATININE URINE RATIO
Creatinine,U: 145.2 mg/dL
Microalb Creat Ratio: 3 mg/g (ref 0.0–30.0)
Microalb, Ur: 4.4 mg/dL — ABNORMAL HIGH (ref 0.0–1.9)

## 2022-03-03 LAB — HEPATIC FUNCTION PANEL
ALT: 12 U/L (ref 0–35)
AST: 15 U/L (ref 0–37)
Albumin: 3.9 g/dL (ref 3.5–5.2)
Alkaline Phosphatase: 84 U/L (ref 39–117)
Bilirubin, Direct: 0.1 mg/dL (ref 0.0–0.3)
Total Bilirubin: 0.5 mg/dL (ref 0.2–1.2)
Total Protein: 6.8 g/dL (ref 6.0–8.3)

## 2022-03-03 LAB — HEMOGLOBIN A1C: Hgb A1c MFr Bld: 7.6 % — ABNORMAL HIGH (ref 4.6–6.5)

## 2022-03-06 ENCOUNTER — Ambulatory Visit (INDEPENDENT_AMBULATORY_CARE_PROVIDER_SITE_OTHER): Payer: Medicare Other | Admitting: Internal Medicine

## 2022-03-06 ENCOUNTER — Encounter: Payer: Self-pay | Admitting: Internal Medicine

## 2022-03-06 VITALS — BP 134/72 | HR 62 | Temp 98.1°F | Resp 14 | Ht 63.0 in | Wt 134.2 lb

## 2022-03-06 DIAGNOSIS — E7800 Pure hypercholesterolemia, unspecified: Secondary | ICD-10-CM | POA: Diagnosis not present

## 2022-03-06 DIAGNOSIS — M7661 Achilles tendinitis, right leg: Secondary | ICD-10-CM

## 2022-03-06 DIAGNOSIS — I10 Essential (primary) hypertension: Secondary | ICD-10-CM | POA: Diagnosis not present

## 2022-03-06 DIAGNOSIS — R300 Dysuria: Secondary | ICD-10-CM

## 2022-03-06 DIAGNOSIS — G7000 Myasthenia gravis without (acute) exacerbation: Secondary | ICD-10-CM

## 2022-03-06 DIAGNOSIS — Z85820 Personal history of malignant melanoma of skin: Secondary | ICD-10-CM

## 2022-03-06 DIAGNOSIS — F320 Major depressive disorder, single episode, mild: Secondary | ICD-10-CM

## 2022-03-06 DIAGNOSIS — E1165 Type 2 diabetes mellitus with hyperglycemia: Secondary | ICD-10-CM

## 2022-03-06 DIAGNOSIS — D0511 Intraductal carcinoma in situ of right breast: Secondary | ICD-10-CM

## 2022-03-06 MED ORDER — HYDROCHLOROTHIAZIDE 25 MG PO TABS: 25.0000 mg | ORAL_TABLET | Freq: Every day | ORAL | 3 refills | Status: DC

## 2022-03-06 MED ORDER — AMLODIPINE BESYLATE 10 MG PO TABS: 10.0000 mg | ORAL_TABLET | Freq: Every day | ORAL | 3 refills | Status: DC

## 2022-03-06 NOTE — Progress Notes (Signed)
Subjective:    Patient ID: Cynthia [name], female    DOB: 11-19-1931, 89 y.o.   MRN: [id number]  Patient here for  Chief Complaint  Patient presents with   Annual Exam    CPE    HPI Here for physical.  Evaluated recently for UTI.  Treated.  Symptoms have improved.  Request urine check today to confirm cleared.  Seeing ortho = achilles tendinopathy.  Medrol dosepak. Discussed with her today - increased pain - achilles.  Discussed boot.  Discussed need for /u with ortho.  She is agreeable.  Discussed labs.  A1c improved - 7.6.  no chest pain or sob reported. Eating.  No abdominal pain.  Bowels moving.  Increased stress and some depression - related to her sister's death.  Misses her sister.  Discussed.  No SI.  Will notify me if feels needs grief counseling.     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 Resected margins negative. Whole breast radiation   Cancer ([place]) 12/15/2012   left upper arm, malignant melanoma Clark level II, Breslow thickness: 0.35 mm.   Diabetes mellitus ([place])    Hypercholesterolemia    Hypertension    Myasthenia gravis ([place])    ocular   Personal history of radiation therapy 2014   Rt. Breast   S/P radiation therapy 2015   BREAST CA   Past Surgical History:  Procedure Laterality Date   BREAST BIOPSY Right 2014   DCIS   BREAST LUMPECTOMY Right 2014   Lumpectomy   BREAST SURGERY Right 2014   lumpectomy   EYE SURGERY     Caterac both eyes   Family History  Problem Relation Age of Onset   Skin cancer Father    Cancer Father        colon   Colon cancer Brother    Cancer Brother        colon   Breast cancer Other        niece   Skin cancer Sister    Social History   Socioeconomic History    Marital status: Divorced    Spouse name: Not on file   Number of children: Not on file   Years of education: Not on file   Highest education level: Not on file  Occupational History   Not on file  Tobacco Use   Smoking status: Never   Smokeless tobacco: Never  Substance and Sexual Activity   Alcohol use: No    Alcohol/week: 0.0 standard drinks of alcohol   Drug use: No   Sexual activity: Never  Other Topics Concern   Not on file  Social History Narrative   Not on file   Social Determinants of Health   Financial Resource Strain: Low Risk  (07/11/2021)   Overall Financial Resource Strain (CARDIA)    Difficulty of Paying Living Expenses: Not hard at all  Food Insecurity: No Food Insecurity (07/11/2021)   Hunger Vital Sign    Worried About Running Out of Food in the Last Year: Never true    [place] in the Last Year: Never true  Transportation Needs: No Transportation Needs (07/11/2021)   PRAPARE -  Hydrologist (Medical): No    Lack of Transportation (Non-Medical): No  Physical Activity: Unknown (07/10/2020)   Exercise Vital Sign    Days of Exercise per Week: 0 days    Minutes of Exercise per Session: Not on file  Stress: No Stress Concern Present (07/11/2021)   [place]    Feeling of Stress : Not at all  Social Connections: Unknown (07/11/2021)   Social Connection and Isolation Panel [NHANES]    Frequency of Communication with Friends and Family: More than three times a week    Frequency of Social Gatherings with Friends and Family: More than three times a week    Attends Religious Services: Not on Advertising copywriter or Organizations: Not on file    Attends Archivist Meetings: Not on file    Marital Status: Not on file     Review of Systems  Constitutional:  Negative for appetite change and unexpected weight change.  HENT:  Negative for congestion  and sinus pressure.   Respiratory:  Negative for cough, chest tightness and shortness of breath.   Cardiovascular:  Negative for chest pain, palpitations and leg swelling.  Gastrointestinal:  Negative for abdominal pain, diarrhea, nausea and vomiting.  Genitourinary:  Negative for difficulty urinating and dysuria.  Musculoskeletal:  Negative for joint swelling and myalgias.       Achilles pain as outlined.   Skin:  Negative for color change and rash.  Neurological:  Negative for dizziness and headaches.  Psychiatric/Behavioral:  Negative for agitation.        Increased stress as outlined.        Objective:     BP 134/72   Pulse 62   Temp 98.1 F (36.7 C) (Temporal)   Resp 14   Ht '5\' 3"'$  (1.6 m)   Wt 134 lb 3.2 oz (60.9 kg)   SpO2 100%   BMI 23.77 kg/m  Wt Readings from Last 3 Encounters:  03/06/22 134 lb 3.2 oz (60.9 kg)  02/06/22 134 lb 6.4 oz (61 kg)  11/05/21 136 lb 9.6 oz (62 kg)    Physical Exam Vitals reviewed.  Constitutional:      General: She is not in acute distress.    Appearance: Normal appearance.  HENT:     Head: Normocephalic and atraumatic.     Right Ear: External ear normal.     Left Ear: External ear normal.  Eyes:     General: No scleral icterus.       Right eye: No discharge.        Left eye: No discharge.     Conjunctiva/sclera: Conjunctivae normal.  Neck:     Thyroid: No thyromegaly.  Cardiovascular:     Rate and Rhythm: Normal rate and regular rhythm.  Pulmonary:     Effort: No respiratory distress.     Breath sounds: Normal breath sounds. No wheezing.  Abdominal:     General: Bowel sounds are normal.     Palpations: Abdomen is soft.     Tenderness: There is no abdominal tenderness.  Musculoskeletal:        General: No swelling or tenderness.     Cervical back: Neck supple. No tenderness.  Lymphadenopathy:     Cervical: No cervical adenopathy.  Skin:    Findings: No erythema or rash.  Neurological:     Mental Status: She is  alert.  Psychiatric:  Mood and Affect: Mood normal.        Behavior: Behavior normal.      Outpatient Encounter Medications as of 03/06/2022  Medication Sig   ACCU-CHEK AVIVA PLUS test strip USE AS INSTRUCTED TO CHECK BLOOD SUGAR ONCE DAILY AS NEEDED   ACCU-CHEK SOFTCLIX LANCETS lancets USE ONCE A DAY   aspirin 81 MG tablet Take 81 mg by mouth daily.   atorvastatin (LIPITOR) 20 MG tablet TAKE 1 TABLET BY MOUTH ONCE DAILY   Calcium Carbonate-Vitamin D 600-400 MG-UNIT tablet Take 1 tablet by mouth 2 (two) times daily.   CRANBERRY PO Take by mouth daily.   ketoconazole (NIZORAL) 2 % cream Apply 1 Application topically at bedtime.   Multiple Vitamins-Minerals (ICAPS AREDS 2) CAPS Take 1 capsule by mouth 2 (two) times daily.   Multiple Vitamins-Minerals (PRESERVISION AREDS 2+MULTI VIT PO) Take by mouth.   nystatin cream (MYCOSTATIN) Apply 1 Application topically 2 (two) times daily.   potassium chloride (KLOR-CON) 10 MEQ tablet TAKE 1 TABLET BY MOUTH TWICE  DAILY   ramipril (ALTACE) 10 MG capsule TAKE 1 CAPSULE BY MOUTH  DAILY   saccharomyces boulardii (FLORASTOR) 250 MG capsule Take 1 capsule (250 mg total) by mouth daily.   traZODone (DESYREL) 50 MG tablet TAKE 1 TABLET BY MOUTH AT  BEDTIME AS NEEDED FOR SLEEP   triamcinolone cream (KENALOG) 0.1 % Apply 1 Application topically daily as needed.   [DISCONTINUED] amLODipine (NORVASC) 10 MG tablet TAKE 1 TABLET BY MOUTH  DAILY   [DISCONTINUED] hydrochlorothiazide (HYDRODIURIL) 25 MG tablet TAKE 1 TABLET BY MOUTH  DAILY   amLODipine (NORVASC) 10 MG tablet Take 1 tablet (10 mg total) by mouth daily.   hydrochlorothiazide (HYDRODIURIL) 25 MG tablet Take 1 tablet (25 mg total) by mouth daily.   No facility-administered encounter medications on file as of 03/06/2022.     Lab Results  Component Value Date   WBC 7.1 06/25/2021   HGB 12.6 06/25/2021   HCT 38.7 06/25/2021   PLT 236.0 06/25/2021   GLUCOSE 121 (H) 03/03/2022   CHOL 156  03/03/2022   TRIG 124.0 03/03/2022   HDL 47.10 03/03/2022   LDLCALC 84 03/03/2022   ALT 12 03/03/2022   AST 15 03/03/2022   NA 141 03/03/2022   K 3.7 03/03/2022   CL 102 03/03/2022   CREATININE 0.92 03/03/2022   BUN 19 03/03/2022   CO2 30 03/03/2022   TSH 1.37 06/25/2021   HGBA1C 7.6 (H) 03/03/2022   MICROALBUR 4.4 (H) 03/03/2022    MM 3D SCREEN BREAST BILATERAL  Result Date: 01/22/2021 CLINICAL DATA:  Screening. EXAM: DIGITAL SCREENING BILATERAL MAMMOGRAM WITH TOMOSYNTHESIS AND CAD TECHNIQUE: Bilateral screening digital craniocaudal and mediolateral oblique mammograms were obtained. Bilateral screening digital breast tomosynthesis was performed. The images were evaluated with computer-aided detection. COMPARISON:  Previous exam(s). ACR Breast Density Category c: The breast tissue is heterogeneously dense, which may obscure small masses. FINDINGS: There are no findings suspicious for malignancy. IMPRESSION: No mammographic evidence of malignancy. A result letter of this screening mammogram will be mailed directly to the patient. RECOMMENDATION: Screening mammogram in one year. (Code:SM-B-01Y) BI-RADS CATEGORY  1: Negative. Electronically Signed   By: Dorise Bullion III M.D.   On: 01/22/2021 11:57      Assessment & Plan:  Primary hypertension Assessment & Plan: Blood pressure as outlined.  Continue amlodipine, ramipril and hctz.  No changes made.  Follow pressures.  Follow metabolic panel.   Orders: -     Basic metabolic  panel; Future  Type 2 diabetes mellitus with hyperglycemia, without long-term current use of insulin (HCC) Assessment & Plan: Follow met b and a1c.   Lab Results  Component Value Date   HGBA1C 7.6 (H) 03/03/2022    Orders: -     Hemoglobin A1c; Future  Hypercholesterolemia Assessment & Plan: On lipitor.  Low cholesterol diet and exercise.  Follow lipid panel and liver function tests.    Orders: -     Lipid panel; Future -     Hepatic function panel;  Future -     CBC with Differential/Platelet; Future -     TSH; Future  Dysuria Assessment & Plan: Recently treated for UTI.  Symptoms improved.  Request recheck urine today to confirm clear.   Orders: -     Urinalysis, Routine w reflex microscopic -     Urine Culture  Ductal carcinoma in situ (DCIS) of right breast Assessment & Plan: Saw Dr Bary Castilla 01/29/21.  Mammogram 01/22/21 - Birads I.  Recommended continuing yearly mammogram. Discussed today.  She desires to hold on f/u mammogram.    Depression, major, single episode, mild ([place]) Assessment & Plan: Increased stress and feeling down - sister recently passed.  Discussed.  Offered counseling.  She declines.  No SI.  Does not feel she needs any further intervention at this time.  Follow.    History of melanoma Assessment & Plan: Followed by dermatology.     Myasthenia gravis [facility]) Assessment & Plan: Has been followed by Dr Manuella Ghazi.  Stable.    Achilles tendinitis of right lower extremity Assessment & Plan: Seeing ortho.  Discussed.  Discussed need for f/u and question of need for boot.  Refer back to ortho.    Other orders -     amLODIPine Besylate; Take 1 tablet (10 mg total) by mouth daily.  Dispense: 90 tablet; Refill: 3 -     hydroCHLOROthiazide; Take 1 tablet (25 mg total) by mouth daily.  Dispense: 90 tablet; Refill: 3     Einar Pheasant, MD

## 2022-03-07 LAB — URINALYSIS, ROUTINE W REFLEX MICROSCOPIC
Bilirubin Urine: NEGATIVE
Hgb urine dipstick: NEGATIVE
Ketones, ur: NEGATIVE
Leukocytes,Ua: NEGATIVE
Nitrite: NEGATIVE
Specific Gravity, Urine: 1.015 (ref 1.000–1.030)
Total Protein, Urine: NEGATIVE
Urine Glucose: NEGATIVE
Urobilinogen, UA: 0.2 (ref 0.0–1.0)
pH: 5.5 (ref 5.0–8.0)

## 2022-03-08 LAB — URINE CULTURE
MICRO NUMBER:: 14424869
SPECIMEN QUALITY:: ADEQUATE

## 2022-03-09 ENCOUNTER — Encounter: Payer: Self-pay | Admitting: Internal Medicine

## 2022-03-09 DIAGNOSIS — M7660 Achilles tendinitis, unspecified leg: Secondary | ICD-10-CM | POA: Insufficient documentation

## 2022-03-09 NOTE — Assessment & Plan Note (Signed)
Blood pressure as outlined.  Continue amlodipine, ramipril and hctz.  No changes made.  Follow pressures.  Follow metabolic panel.

## 2022-03-09 NOTE — Assessment & Plan Note (Signed)
Increased stress and feeling down - sister recently passed.  Discussed.  Offered counseling.  She declines.  No SI.  Does not feel she needs any further intervention at this time.  Follow.

## 2022-03-09 NOTE — Assessment & Plan Note (Signed)
On lipitor.  Low cholesterol diet and exercise.  Follow lipid panel and liver function tests.   

## 2022-03-09 NOTE — Assessment & Plan Note (Signed)
Seeing ortho.  Discussed.  Discussed need for f/u and question of need for boot.  Refer back to ortho.

## 2022-03-09 NOTE — Assessment & Plan Note (Signed)
Followed by dermatology

## 2022-03-09 NOTE — Assessment & Plan Note (Signed)
Follow met b and a1c.   Lab Results  Component Value Date   HGBA1C 7.6 (H) 03/03/2022

## 2022-03-09 NOTE — Assessment & Plan Note (Signed)
Recently treated for UTI.  Symptoms improved.  Request recheck urine today to confirm clear.

## 2022-03-09 NOTE — Assessment & Plan Note (Signed)
Saw Dr Bary Castilla 01/29/21.  Mammogram 01/22/21 - Birads I.  Recommended continuing yearly mammogram. Discussed today.  She desires to hold on f/u mammogram.

## 2022-03-09 NOTE — Assessment & Plan Note (Signed)
Has been followed by Dr Manuella Ghazi.  Stable.

## 2022-05-06 ENCOUNTER — Telehealth: Payer: Self-pay

## 2022-05-08 ENCOUNTER — Encounter: Payer: Self-pay | Admitting: Nurse Practitioner

## 2022-05-08 ENCOUNTER — Ambulatory Visit (INDEPENDENT_AMBULATORY_CARE_PROVIDER_SITE_OTHER): Payer: Medicare Other | Admitting: Nurse Practitioner

## 2022-05-08 VITALS — BP 122/70 | HR 65 | Temp 98.1°F | Ht 63.0 in | Wt 136.6 lb

## 2022-05-08 DIAGNOSIS — N390 Urinary tract infection, site not specified: Secondary | ICD-10-CM | POA: Diagnosis not present

## 2022-05-08 DIAGNOSIS — R300 Dysuria: Secondary | ICD-10-CM | POA: Diagnosis not present

## 2022-05-08 LAB — POC URINALSYSI DIPSTICK (AUTOMATED)
Bilirubin, UA: NEGATIVE
Blood, UA: NEGATIVE
Glucose, UA: NEGATIVE
Nitrite, UA: POSITIVE
Protein, UA: POSITIVE — AB
Spec Grav, UA: 1.015 (ref 1.010–1.025)
Urobilinogen, UA: >=8 E.U./dL — AB
pH, UA: 6 (ref 5.0–8.0)

## 2022-05-08 MED ORDER — CIPROFLOXACIN HCL 500 MG PO TABS: 500.0000 mg | ORAL_TABLET | Freq: Two times a day (BID) | ORAL | 0 refills | Status: AC

## 2022-05-08 NOTE — Patient Instructions (Signed)
Cipro sent to ph[facility]acy. Will sent urine for culture. Call with the results. Increase fluid in take

## 2022-05-08 NOTE — Progress Notes (Signed)
Established Patient Office Visit  Subjective:  Patient ID: Cynthia [name], female    DOB: Apr 25, 1931  Age: 89 y.o. MRN: [id number]  CC:  Chief Complaint  Patient presents with   Urinary Tract Infection    Burning when urinating     HPI  SUBJECTIVE: Cynthia [name] is a 89 y.o. female who complains of urinary frequency, dysuria, vaginal discharge x 5-7 days. Denies flank pain, fever, chills or bleeding.   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 Resected margins negative. Whole breast radiation   Cancer ([place]) 12/15/2012   left upper arm, malignant melanoma Clark level II, Breslow thickness: 0.35 mm.   Diabetes mellitus ([place])    Hypercholesterolemia    Hypertension    Myasthenia gravis ([place])    ocular   Personal history of radiation therapy 2014   Rt. Breast   S/P radiation therapy 2015   BREAST CA    Past Surgical History:  Procedure Laterality Date   BREAST BIOPSY Right 2014   DCIS   BREAST LUMPECTOMY Right 2014   Lumpectomy   BREAST SURGERY Right 2014   lumpectomy   EYE SURGERY     Caterac both eyes    Family History  Problem Relation Age of Onset   Skin cancer Father    Cancer Father        colon   Colon cancer Brother    Cancer Brother        colon   Breast cancer Other        niece   Skin cancer Sister     Social History   Socioeconomic History   Marital status: Divorced    Spouse name: Not on file   Number of children: Not on file   Years of education: Not on file   Highest education level: Not on file  Occupational History   Not on file  Tobacco Use   Smoking status: Never   Smokeless tobacco: Never  Substance and Sexual Activity   Alcohol use: No    Alcohol/week: 0.0  standard drinks of alcohol   Drug use: No   Sexual activity: Never  Other Topics Concern   Not on file  Social History Narrative   Not on file   Social Determinants of Health   Financial Resource Strain: Low Risk  (07/11/2021)   Overall Financial Resource Strain (CARDIA)    Difficulty of Paying Living Expenses: Not hard at all  Food Insecurity: No Food Insecurity (07/11/2021)   Hunger Vital Sign    Worried About Running Out of Food in the Last Year: Never true    [place] in the Last Year: Never true  Transportation Needs: No Transportation Needs (07/11/2021)   PRAPARE - Hydrologist (Medical): No    Lack of Transportation (Non-Medical): No  Physical Activity: Unknown (07/10/2020)   Exercise Vital Sign    Days of Exercise per Week: 0 days    Minutes of Exercise per Session: Not on file  Stress: No Stress Concern Present (07/11/2021)   [place]    Feeling  of Stress : Not at all  Social Connections: Unknown (07/11/2021)   Social Connection and Isolation Panel [NHANES]    Frequency of Communication with Friends and Family: More than three times a week    Frequency of Social Gatherings with Friends and Family: More than three times a week    Attends Religious Services: Not on file    Active Member of [place] or Organizations: Not on file    Attends Archivist Meetings: Not on file    Marital Status: Not on file  Intimate Partner Violence: Not At Risk (07/11/2021)   Humiliation, Afraid, Rape, and Kick questionnaire    Fear of Current or Ex-Partner: No    Emotionally Abused: No    Physically Abused: No    Sexually Abused: No     Outpatient Medications Prior to Visit  Medication Sig Dispense Refill   ACCU-CHEK AVIVA PLUS test strip USE AS INSTRUCTED TO CHECK BLOOD SUGAR ONCE DAILY AS NEEDED 100 strip 3   ACCU-CHEK SOFTCLIX LANCETS lancets USE ONCE A DAY 100 each 3    amLODipine (NORVASC) 10 MG tablet Take 1 tablet (10 mg total) by mouth daily. 90 tablet 3   aspirin 81 MG tablet Take 81 mg by mouth daily.     atorvastatin (LIPITOR) 20 MG tablet TAKE 1 TABLET BY MOUTH ONCE DAILY 90 tablet 3   Calcium Carbonate-Vitamin D 600-400 MG-UNIT tablet Take 1 tablet by mouth 2 (two) times daily.     CRANBERRY PO Take by mouth daily.     hydrochlorothiazide (HYDRODIURIL) 25 MG tablet Take 1 tablet (25 mg total) by mouth daily. 90 tablet 3   ketoconazole (NIZORAL) 2 % cream Apply 1 Application topically at bedtime. 60 g 6   Multiple Vitamins-Minerals (ICAPS AREDS 2) CAPS Take 1 capsule by mouth 2 (two) times daily.     Multiple Vitamins-Minerals (PRESERVISION AREDS 2+MULTI VIT PO) Take by mouth.     nystatin cream (MYCOSTATIN) Apply 1 Application topically 2 (two) times daily. 30 g 0   potassium chloride (KLOR-CON) 10 MEQ tablet TAKE 1 TABLET BY MOUTH TWICE  DAILY 180 tablet 3   ramipril (ALTACE) 10 MG capsule TAKE 1 CAPSULE BY MOUTH  DAILY 90 capsule 3   saccharomyces boulardii (FLORASTOR) 250 MG capsule Take 1 capsule (250 mg total) by mouth daily. 90 capsule 0   traZODone (DESYREL) 50 MG tablet TAKE 1 TABLET BY MOUTH AT  BEDTIME AS NEEDED FOR SLEEP 90 tablet 1   triamcinolone cream (KENALOG) 0.1 % Apply 1 Application topically daily as needed. 80 g 1   No facility-administered medications prior to visit.    Allergies  Allergen Reactions   No Known Drug Allergy     ROS Review of Systems  Constitutional: Negative.   Gastrointestinal: Negative.   Genitourinary:  Positive for dysuria and frequency. Negative for flank pain and hematuria.  Musculoskeletal: Negative.   Neurological: Negative.   Psychiatric/Behavioral: Negative.        Objective:    Physical Exam Constitutional:      Appearance: Normal appearance.  Cardiovascular:     Rate and Rhythm: Normal rate and regular rhythm.     Pulses: Normal pulses.     Heart sounds: Normal heart sounds.   Pulmonary:     Effort: Pulmonary effort is normal.     Breath sounds: No stridor. No wheezing.  Abdominal:     General: Abdomen is flat. Bowel sounds are normal.     Palpations: Abdomen is soft.  There is no mass.     Tenderness: There is no abdominal tenderness.  Skin:    General: Skin is warm.  Neurological:     General: No focal deficit present.     Mental Status: She is alert and oriented to person, place, and time. Mental status is at baseline.  Psychiatric:        Mood and Affect: Mood normal.        Behavior: Behavior normal.        Thought Content: Thought content normal.        Judgment: Judgment normal.     BP 122/70   Pulse 65   Temp 98.1 F (36.7 C)   Ht '5\' 3"'$  (1.6 m)   Wt 136 lb 9.6 oz (62 kg)   SpO2 98%   BMI 24.20 kg/m  Wt Readings from Last 3 Encounters:  05/08/22 136 lb 9.6 oz (62 kg)  03/06/22 134 lb 3.2 oz (60.9 kg)  02/06/22 134 lb 6.4 oz (61 kg)     Health Maintenance  Topic Date Due   FOOT EXAM  04/10/2021   Zoster Vaccines- Shingrix (2 of 2) 05/08/2022 (Originally 12/26/2020)   COVID-19 Vaccine (5 - 2023-24 season) 05/24/2022 (Originally 10/25/2021)   Pneumonia Vaccine 68+ Years old (2 of 2 - PPSV23 or PCV20) 07/05/2022 (Originally 11/24/2014)   MAMMOGRAM  03/07/2023 (Originally 01/21/2022)   OPHTHALMOLOGY EXAM  07/11/2022   Medicare Annual Wellness (AWV)  07/12/2022   HEMOGLOBIN A1C  09/01/2022   INFLUENZA VACCINE  Completed   DEXA SCAN  Completed   HPV VACCINES  Aged Out   DTaP/Tdap/Td  Discontinued    There are no preventive care reminders to display for this patient.  Lab Results  Component Value Date   TSH 1.37 06/25/2021   Lab Results  Component Value Date   WBC 7.1 06/25/2021   HGB 12.6 06/25/2021   HCT 38.7 06/25/2021   MCV 84.4 06/25/2021   PLT 236.0 06/25/2021   Lab Results  Component Value Date   NA 141 03/03/2022   K 3.7 03/03/2022   CO2 30 03/03/2022   GLUCOSE 121 (H) 03/03/2022   BUN 19 03/03/2022   CREATININE  0.92 03/03/2022   BILITOT 0.5 03/03/2022   ALKPHOS 84 03/03/2022   AST 15 03/03/2022   ALT 12 03/03/2022   PROT 6.8 03/03/2022   ALBUMIN 3.9 03/03/2022   CALCIUM 10.1 03/03/2022   GFR 55.67 (L) 03/03/2022   Lab Results  Component Value Date   CHOL 156 03/03/2022   Lab Results  Component Value Date   HDL 47.10 03/03/2022   Lab Results  Component Value Date   LDLCALC 84 03/03/2022   Lab Results  Component Value Date   TRIG 124.0 03/03/2022   Lab Results  Component Value Date   CHOLHDL 3 03/03/2022   Lab Results  Component Value Date   HGBA1C 7.6 (H) 03/03/2022      Assessment & Plan:  Urinary tract infection without hematuria, site unspecified Assessment & Plan: Appears well, in no apparent distress.  Vital signs are normal. The abdomen is soft without tenderness, guarding, mass, rebound or organomegaly. No CVA tenderness or inguinal adenopathy noted.  Urine dipstick positive for protein, urobilinogen, nitrite and leukocyte. Will send urine for culture. Started her on Cipro twice a day for 3 days.   We will call her with the results of culture.   Burning with urination -     POCT Urinalysis Dipstick (Automated) -  Urine Culture  Other orders -     Ciprofloxacin HCl; Take 1 tablet (500 mg total) by mouth 2 (two) times daily for 3 days.  Dispense: 6 tablet; Refill: 0    Follow-up: No follow-ups on file.   Theresia Lo, NP

## 2022-05-08 NOTE — Assessment & Plan Note (Signed)
Appears well, in no apparent distress.  Vital signs are normal. The abdomen is soft without tenderness, guarding, mass, rebound or organomegaly. No CVA tenderness or inguinal adenopathy noted.  Urine dipstick positive for protein, urobilinogen, nitrite and leukocyte. Will send urine for culture. Started her on Cipro twice a day for 3 days.   We will call her with the results of culture.

## 2022-05-09 ENCOUNTER — Other Ambulatory Visit: Payer: Self-pay | Admitting: Student

## 2022-05-09 DIAGNOSIS — M47816 Spondylosis without myelopathy or radiculopathy, lumbar region: Secondary | ICD-10-CM

## 2022-05-09 DIAGNOSIS — M5416 Radiculopathy, lumbar region: Secondary | ICD-10-CM

## 2022-05-12 ENCOUNTER — Telehealth: Payer: Self-pay | Admitting: Nurse Practitioner

## 2022-05-13 ENCOUNTER — Other Ambulatory Visit: Payer: Self-pay | Admitting: Nurse Practitioner

## 2022-05-13 MED ORDER — CIPROFLOXACIN HCL 250 MG PO TABS: 250.0000 mg | ORAL_TABLET | Freq: Two times a day (BID) | ORAL | 0 refills | Status: AC

## 2022-05-13 NOTE — Progress Notes (Signed)
Cipro has been sent to [facility].

## 2022-05-13 NOTE — Telephone Encounter (Signed)
Pt called wanting an update on her lab results

## 2022-05-13 NOTE — Telephone Encounter (Signed)
Per chart, patient was given her urine culture results 3/19

## 2022-05-13 NOTE — Progress Notes (Signed)
Please inform patient that the urine culture shows E. coli infection.  She has been treated with Cipro for 3 days and it works for E. coli. Please check with her does she have any urinary symptoms and if she still have symptoms we can send Cipro for 5 days.

## 2022-05-14 NOTE — Telephone Encounter (Signed)
Reviewed.  She has been treated for UTI.  Please confirm doing ok and that symptoms have cleared.

## 2022-05-14 NOTE — Telephone Encounter (Signed)
Left message for Patient to call the office back in case she has questions/concerns

## 2022-05-14 NOTE — Telephone Encounter (Signed)
Pt returned Elizabeth CMA call. Transferred. 

## 2022-05-14 NOTE — Telephone Encounter (Signed)
Spoke to Patient again- she just wanted [place] to know that she got the antibiotics and started it last night

## 2022-05-14 NOTE — Telephone Encounter (Signed)
I spoke to this Patient yesterday about her urine results and asked about her symptoms like Cynthia [name] asked me to do. Then I sent the message to [facility] which called in some more antibiotics.

## 2022-05-14 NOTE — Telephone Encounter (Signed)
Please confirm she is doing ok.  Let us know if problems.

## 2022-05-15 ENCOUNTER — Ambulatory Visit: Payer: Medicare Other | Admitting: Dermatology

## 2022-05-16 NOTE — Telephone Encounter (Signed)
Patient says shes feeling better and that the antibiotics are working. She has a couple of pills left.

## 2022-05-19 LAB — URINE CULTURE
MICRO NUMBER:: 14693233
SPECIMEN QUALITY:: ADEQUATE

## 2022-05-20 ENCOUNTER — Other Ambulatory Visit: Payer: Self-pay | Admitting: Internal Medicine

## 2022-05-23 ENCOUNTER — Ambulatory Visit
Admission: RE | Admit: 2022-05-23 | Discharge: 2022-05-23 | Disposition: A | Discharge status: Home / Self Care | Payer: Medicare Other | Source: Ambulatory Visit | Attending: Student | Admitting: Student

## 2022-05-23 DIAGNOSIS — M5416 Radiculopathy, lumbar region: Secondary | ICD-10-CM | POA: Diagnosis present

## 2022-05-23 DIAGNOSIS — M47816 Spondylosis without myelopathy or radiculopathy, lumbar region: Secondary | ICD-10-CM | POA: Insufficient documentation

## 2022-06-05 ENCOUNTER — Other Ambulatory Visit (INDEPENDENT_AMBULATORY_CARE_PROVIDER_SITE_OTHER): Payer: Medicare Other

## 2022-06-05 DIAGNOSIS — E1165 Type 2 diabetes mellitus with hyperglycemia: Secondary | ICD-10-CM

## 2022-06-05 DIAGNOSIS — E7800 Pure hypercholesterolemia, unspecified: Secondary | ICD-10-CM | POA: Diagnosis not present

## 2022-06-05 DIAGNOSIS — I10 Essential (primary) hypertension: Secondary | ICD-10-CM

## 2022-06-05 LAB — HEPATIC FUNCTION PANEL
ALT: 13 U/L (ref 0–35)
AST: 16 U/L (ref 0–37)
Albumin: 3.9 g/dL (ref 3.5–5.2)
Alkaline Phosphatase: 100 U/L (ref 39–117)
Bilirubin, Direct: 0.1 mg/dL (ref 0.0–0.3)
Total Bilirubin: 0.5 mg/dL (ref 0.2–1.2)
Total Protein: 6.7 g/dL (ref 6.0–8.3)

## 2022-06-05 LAB — CBC WITH DIFFERENTIAL/PLATELET
Basophils Absolute: 0 10*3/uL (ref 0.0–0.1)
Basophils Relative: 0.3 % (ref 0.0–3.0)
Eosinophils Absolute: 0.2 10*3/uL (ref 0.0–0.7)
Eosinophils Relative: 2.5 % (ref 0.0–5.0)
HCT: 39.3 % (ref 36.0–46.0)
Hemoglobin: 12.8 g/dL (ref 12.0–15.0)
Lymphocytes Relative: 30 % (ref 12.0–46.0)
Lymphs Abs: 2 10*3/uL (ref 0.7–4.0)
MCHC: 32.7 g/dL (ref 30.0–36.0)
MCV: 84 fl (ref 78.0–100.0)
Monocytes Absolute: 0.5 10*3/uL (ref 0.1–1.0)
Monocytes Relative: 7.7 % (ref 3.0–12.0)
Neutro Abs: 4.1 10*3/uL (ref 1.4–7.7)
Neutrophils Relative %: 59.5 % (ref 43.0–77.0)
Platelets: 247 10*3/uL (ref 150.0–400.0)
RBC: 4.67 Mil/uL (ref 3.87–5.11)
RDW: 15 % (ref 11.5–15.5)
WBC: 6.8 10*3/uL (ref 4.0–10.5)

## 2022-06-05 LAB — BASIC METABOLIC PANEL
BUN: 15 mg/dL (ref 6–23)
CO2: 31 mEq/L (ref 19–32)
Calcium: 10 mg/dL (ref 8.4–10.5)
Chloride: 101 mEq/L (ref 96–112)
Creatinine, Ser: 0.91 mg/dL (ref 0.40–1.20)
GFR: 56.3 mL/min — ABNORMAL LOW (ref >60.00)
Glucose, Bld: 133 mg/dL — ABNORMAL HIGH (ref 70–99)
Potassium: 3.9 mEq/L (ref 3.5–5.1)
Sodium: 141 mEq/L (ref 135–145)

## 2022-06-05 LAB — LIPID PANEL
Cholesterol: 168 mg/dL (ref 0–200)
HDL: 50.7 mg/dL (ref >39.00)
LDL Cholesterol: 93 mg/dL (ref 0–99)
NonHDL: 117.38
Total CHOL/HDL Ratio: 3
Triglycerides: 122 mg/dL (ref 0.0–149.0)
VLDL: 24.4 mg/dL (ref 0.0–40.0)

## 2022-06-05 LAB — HEMOGLOBIN A1C: Hgb A1c MFr Bld: 7.4 % — ABNORMAL HIGH (ref 4.6–6.5)

## 2022-06-05 LAB — TSH: TSH: 1.32 u[IU]/mL (ref 0.35–5.50)

## 2022-06-09 ENCOUNTER — Encounter: Payer: Self-pay | Admitting: Internal Medicine

## 2022-06-09 ENCOUNTER — Ambulatory Visit (INDEPENDENT_AMBULATORY_CARE_PROVIDER_SITE_OTHER): Payer: Medicare Other | Admitting: Internal Medicine

## 2022-06-09 VITALS — BP 120/68 | HR 71 | Temp 98.3°F | Resp 16 | Ht 63.0 in | Wt 135.6 lb

## 2022-06-09 DIAGNOSIS — E7800 Pure hypercholesterolemia, unspecified: Secondary | ICD-10-CM

## 2022-06-09 DIAGNOSIS — G7000 Myasthenia gravis without (acute) exacerbation: Secondary | ICD-10-CM

## 2022-06-09 DIAGNOSIS — F320 Major depressive disorder, single episode, mild: Secondary | ICD-10-CM

## 2022-06-09 DIAGNOSIS — Z85820 Personal history of malignant melanoma of skin: Secondary | ICD-10-CM | POA: Diagnosis not present

## 2022-06-09 DIAGNOSIS — D0511 Intraductal carcinoma in situ of right breast: Secondary | ICD-10-CM

## 2022-06-09 DIAGNOSIS — E1165 Type 2 diabetes mellitus with hyperglycemia: Secondary | ICD-10-CM

## 2022-06-09 DIAGNOSIS — I10 Essential (primary) hypertension: Secondary | ICD-10-CM

## 2022-06-09 MED ORDER — POTASSIUM CHLORIDE ER 10 MEQ PO TBCR: 10.0000 meq | EXTENDED_RELEASE_TABLET | Freq: Two times a day (BID) | ORAL | 3 refills | Status: DC

## 2022-06-09 MED ORDER — ATORVASTATIN CALCIUM 20 MG PO TABS: 20.0000 mg | ORAL_TABLET | Freq: Every day | ORAL | 3 refills | Status: DC

## 2022-06-09 MED ORDER — RAMIPRIL 10 MG PO CAPS: 10.0000 mg | ORAL_CAPSULE | Freq: Every day | ORAL | 3 refills | Status: DC

## 2022-06-09 NOTE — Progress Notes (Signed)
Subjective:    Patient ID: Cynthia [name], female    DOB: [birth year], 89 y.o.   MRN: [id number]  Patient here for  Chief Complaint  Patient presents with   Medical Management of Chronic Issues    HPI Here to follow up regarding hypercholesterolemia, hypertension and diabetes.  Recently saw ortho - knee injections.  Had MRI - L-S spine.  Planning to see Dr Yves Dill this week.  No chest pain or sob reported.  No cough or congestion.  No abdominal pain reported.  Increased stress and some depression - dealing with her sister's passing. Another sister sick.  Discussed.  Does not feel she needs any further intervention.  Denies SI.    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 Resected margins negative. Whole breast radiation   Cancer 12/15/2012   left upper arm, malignant melanoma Clark level II, Breslow thickness: 0.35 mm.   Diabetes mellitus    Hypercholesterolemia    Hypertension    Myasthenia gravis    ocular   Personal history of radiation therapy 2014   Rt. Breast   S/P radiation therapy 2015   BREAST CA   Past Surgical History:  Procedure Laterality Date   BREAST BIOPSY Right 2014   DCIS   BREAST LUMPECTOMY Right 2014   Lumpectomy   BREAST SURGERY Right 2014   lumpectomy   EYE SURGERY     Caterac both eyes   Family History  Problem Relation Age of Onset   Skin cancer Father    Cancer Father        colon   Colon cancer Brother    Cancer Brother        colon   Breast cancer Other        niece   Skin cancer Sister    Social History   Socioeconomic History   Marital status: Divorced    Spouse name: Not on file   Number of children: Not on file   Years of education: Not on file   Highest education level: Not on file   Occupational History   Not on file  Tobacco Use   Smoking status: Never   Smokeless tobacco: Never  Substance and Sexual Activity   Alcohol use: No    Alcohol/week: 0.0 standard drinks of alcohol   Drug use: No   Sexual activity: Never  Other Topics Concern   Not on file  Social History Narrative   Not on file   Social Determinants of Health   Financial Resource Strain: Low Risk  (07/11/2021)   Overall Financial Resource Strain (CARDIA)    Difficulty of Paying Living Expenses: Not hard at all  Food Insecurity: No Food Insecurity (07/11/2021)   Hunger Vital Sign    Worried About Running Out of Food in the Last Year: Never true    Ran Out of Food in the Last Year: Never true  Transportation Needs: No Transportation Needs (07/11/2021)   PRAPARE - Administrator, Civil Service (Medical): No    Lack of Transportation (Non-Medical): No  Physical Activity: Unknown (07/10/2020)   Exercise Vital Sign    Days of Exercise per Week: 0 days  Minutes of Exercise per Session: Not on file  Stress: No Stress Concern Present (07/11/2021)   [employer] of Occupational Health - Occupational Stress Questionnaire    Feeling of Stress : Not at all  Social Connections: Unknown (07/11/2021)   Social Connection and Isolation Panel [NHANES]    Frequency of Communication with Friends and Family: More than three times a week    Frequency of Social Gatherings with Friends and Family: More than three times a week    Attends Religious Services: Not on Marketing executive or Organizations: Not on file    Attends Banker Meetings: Not on file    Marital Status: Not on file     Review of Systems  Constitutional:  Negative for appetite change and unexpected weight change.  HENT:  Negative for congestion and sinus pressure.   Respiratory:  Negative for cough, chest tightness and shortness of breath.   Cardiovascular:  Negative for chest pain and palpitations.   Gastrointestinal:  Negative for abdominal pain, diarrhea, nausea and vomiting.  Genitourinary:  Negative for difficulty urinating and dysuria.  Musculoskeletal:  Negative for joint swelling and myalgias.  Skin:  Negative for color change and rash.  Neurological:  Negative for dizziness and headaches.  Psychiatric/Behavioral:  Positive for dysphoric mood. Negative for agitation.        Objective:     BP 120/68   Pulse 71   Temp 98.3 F (36.8 C)   Resp 16   Ht 5\' 3"  (1.6 m)   Wt 135 lb 9.6 oz (61.5 kg)   SpO2 98%   BMI 24.02 kg/m  Wt Readings from Last 3 Encounters:  06/09/22 135 lb 9.6 oz (61.5 kg)  05/08/22 136 lb 9.6 oz (62 kg)  03/06/22 134 lb 3.2 oz (60.9 kg)    Physical Exam Vitals reviewed.  Constitutional:      General: She is not in acute distress.    Appearance: Normal appearance.  HENT:     Head: Normocephalic and atraumatic.     Right Ear: External ear normal.     Left Ear: External ear normal.  Eyes:     General: No scleral icterus.       Right eye: No discharge.        Left eye: No discharge.     Conjunctiva/sclera: Conjunctivae normal.  Neck:     Thyroid: No thyromegaly.  Cardiovascular:     Rate and Rhythm: Normal rate and regular rhythm.  Pulmonary:     Effort: No respiratory distress.     Breath sounds: Normal breath sounds. No wheezing.  Abdominal:     General: Bowel sounds are normal.     Palpations: Abdomen is soft.     Tenderness: There is no abdominal tenderness.  Musculoskeletal:        General: No swelling or tenderness.     Cervical back: Neck supple. No tenderness.  Lymphadenopathy:     Cervical: No cervical adenopathy.  Skin:    Findings: No erythema or rash.  Neurological:     Mental Status: She is alert.  Psychiatric:        Mood and Affect: Mood normal.        Behavior: Behavior normal.      Outpatient Encounter Medications as of 06/09/2022  Medication Sig   ACCU-CHEK AVIVA PLUS test strip USE AS INSTRUCTED TO CHECK  BLOOD SUGAR ONCE DAILY AS NEEDED   ACCU-CHEK SOFTCLIX LANCETS lancets USE ONCE  A DAY   amLODipine (NORVASC) 10 MG tablet Take 1 tablet (10 mg total) by mouth daily.   aspirin 81 MG tablet Take 81 mg by mouth daily.   atorvastatin (LIPITOR) 20 MG tablet Take 1 tablet (20 mg total) by mouth daily.   Calcium Carbonate-Vitamin D 600-400 MG-UNIT tablet Take 1 tablet by mouth 2 (two) times daily.   CRANBERRY PO Take by mouth daily.   hydrochlorothiazide (HYDRODIURIL) 25 MG tablet Take 1 tablet (25 mg total) by mouth daily.   ketoconazole (NIZORAL) 2 % cream Apply 1 Application topically at bedtime.   Multiple Vitamins-Minerals (ICAPS AREDS 2) CAPS Take 1 capsule by mouth 2 (two) times daily.   Multiple Vitamins-Minerals (PRESERVISION AREDS 2+MULTI VIT PO) Take by mouth.   potassium chloride (KLOR-CON) 10 MEQ tablet Take 1 tablet (10 mEq total) by mouth 2 (two) times daily.   ramipril (ALTACE) 10 MG capsule Take 1 capsule (10 mg total) by mouth daily.   saccharomyces boulardii (FLORASTOR) 250 MG capsule Take 1 capsule (250 mg total) by mouth daily.   traZODone (DESYREL) 50 MG tablet TAKE 1 TABLET BY MOUTH AT  BEDTIME AS NEEDED FOR SLEEP   [DISCONTINUED] atorvastatin (LIPITOR) 20 MG tablet TAKE 1 TABLET BY MOUTH ONCE DAILY   [DISCONTINUED] nystatin cream (MYCOSTATIN) Apply 1 Application topically 2 (two) times daily.   [DISCONTINUED] potassium chloride (KLOR-CON) 10 MEQ tablet TAKE 1 TABLET BY MOUTH TWICE  DAILY   [DISCONTINUED] ramipril (ALTACE) 10 MG capsule TAKE 1 CAPSULE BY MOUTH  DAILY   [DISCONTINUED] triamcinolone cream (KENALOG) 0.1 % Apply 1 Application topically daily as needed.   No facility-administered encounter medications on file as of 06/09/2022.     Lab Results  Component Value Date   WBC 6.8 06/05/2022   HGB 12.8 06/05/2022   HCT 39.3 06/05/2022   PLT 247.0 06/05/2022   GLUCOSE 133 (H) 06/05/2022   CHOL 168 06/05/2022   TRIG 122.0 06/05/2022   HDL 50.70 06/05/2022   LDLCALC  93 06/05/2022   ALT 13 06/05/2022   AST 16 06/05/2022   NA 141 06/05/2022   K 3.9 06/05/2022   CL 101 06/05/2022   CREATININE 0.91 06/05/2022   BUN 15 06/05/2022   CO2 31 06/05/2022   TSH 1.32 06/05/2022   HGBA1C 7.4 (H) 06/05/2022   MICROALBUR 4.4 (H) 03/03/2022    MR LUMBAR SPINE WO CONTRAST  Result Date: 05/26/2022 CLINICAL DATA:  Low back pain radiating down both legs, right worse than left EXAM: MRI LUMBAR SPINE WITHOUT CONTRAST TECHNIQUE: Multiplanar, multisequence MR imaging of the lumbar spine was performed. No intravenous contrast was administered. COMPARISON:  08/09/2015 FINDINGS: Segmentation:  Standard. Alignment:  2 mm retrolisthesis of L2 on L3, L3 on L4 and L5 on S1. Vertebrae: No acute fracture, evidence of discitis, or aggressive bone lesion. Large vertebral body hemangioma in the L1 vertebral body. Small vertebral body hemangioma in the T12 vertebral body. Conus medullaris and cauda equina: Conus extends to the L1 level. Conus and cauda equina appear normal. Paraspinal and other soft tissues: No acute paraspinal abnormality. Disc levels: Disc spaces: Degenerative disease with disc height loss at T11-12, L1-2, L2-3, L3-4, L5-S1 and to lesser extent L4-5. T11-12: Mild broad-based disc bulge. No foraminal or central canal stenosis. T12-L1: Mild broad-based disc bulge. No foraminal or central canal stenosis. L1-L2: Broad-based disc bulge. Mild bilateral facet arthropathy. Mild spinal stenosis. Mild left foraminal stenosis. No right foraminal stenosis. L2-L3: Broad-based disc bulge. Mild bilateral facet arthropathy. Mild-moderate spinal  stenosis. Bilateral subarticular recess stenosis. Moderate bilateral foraminal stenosis. L3-L4: Broad-based disc bulge. Mild bilateral facet arthropathy. Left subarticular recess stenosis. Severe left foraminal stenosis. No right foraminal stenosis. L4-L5: Broad-based disc bulge with a broad right subarticular disc protrusion impinging upon the right  intraspinal L5 nerve root. Severe left facet arthropathy and mild right facet arthropathy. Mild-moderate spinal stenosis. Left subarticular recess stenosis. Severe left foraminal stenosis. No right foraminal stenosis. L5-S1: Broad-based disc bulge. Mild bilateral facet arthropathy. Bilateral subarticular recess stenosis. Moderate-severe left foraminal stenosis. Mild-moderate right foraminal stenosis. No spinal stenosis. IMPRESSION: 1. Diffuse lumbar spine spondylosis as described above. 2. No acute osseous injury of the lumbar spine. Electronically Signed   By: Elige Ko M.D.   On: 05/26/2022 07:20       Assessment & Plan:  Ductal carcinoma in situ (DCIS) of right breast Assessment & Plan: Saw Dr Lemar Livings 01/29/21.  Mammogram 01/22/21 - Birads I.  Have discussed continuing yearly mammogram. Discussed today.  She desires to hold on f/u mammogram.    Depression, major, single episode, mild Assessment & Plan: Increased stress and feeling down - sister recently passed.  Discussed.  Offered counseling.  She declines.  No SI.  Does not feel she needs any further intervention at this time.  Follow.    Type 2 diabetes mellitus with hyperglycemia, without long-term current use of insulin Assessment & Plan: Follow met b and a1c.   Lab Results  Component Value Date   HGBA1C 7.4 (H) 06/05/2022    Orders: -     Basic metabolic panel; Future -     Hemoglobin A1c; Future  History of melanoma Assessment & Plan: Followed by dermatology.     Hypercholesterolemia Assessment & Plan: On lipitor.  Low cholesterol diet and exercise.  Follow lipid panel and liver function tests.    Orders: -     Lipid panel; Future -     Hepatic function panel; Future  Primary hypertension Assessment & Plan: Blood pressure as outlined.  Continue amlodipine, ramipril and hctz.  No changes made.  Follow pressures.  Follow metabolic panel.    Myasthenia gravis Assessment & Plan: Has been followed by Dr Sherryll Burger.   Stable.    Other orders -     Atorvastatin Calcium; Take 1 tablet (20 mg total) by mouth daily.  Dispense: 90 tablet; Refill: 3 -     Potassium Chloride ER; Take 1 tablet (10 mEq total) by mouth 2 (two) times daily.  Dispense: 180 tablet; Refill: 3 -     Ramipril; Take 1 capsule (10 mg total) by mouth daily.  Dispense: 90 capsule; Refill: 3     Dale [place], MD

## 2022-06-15 ENCOUNTER — Encounter: Payer: Self-pay | Admitting: Internal Medicine

## 2022-06-15 NOTE — Assessment & Plan Note (Signed)
On lipitor.  Low cholesterol diet and exercise.  Follow lipid panel and liver function tests.   

## 2022-06-15 NOTE — Assessment & Plan Note (Signed)
Follow met b and a1c.   Lab Results  Component Value Date   HGBA1C 7.4 (H) 06/05/2022

## 2022-06-15 NOTE — Assessment & Plan Note (Signed)
Increased stress and feeling down - sister recently passed.  Discussed.  Offered counseling.  She declines.  No SI.  Does not feel she needs any further intervention at this time.  Follow.  

## 2022-06-15 NOTE — Assessment & Plan Note (Signed)
Has been followed by Dr Shah.  Stable.  

## 2022-06-15 NOTE — Assessment & Plan Note (Signed)
Blood pressure as outlined.  Continue amlodipine, ramipril and hctz.  No changes made.  Follow pressures.  Follow metabolic panel.  

## 2022-06-15 NOTE — Assessment & Plan Note (Signed)
Saw Dr Lemar Livings 01/29/21.  Mammogram 01/22/21 - Birads I.  Have discussed continuing yearly mammogram. Discussed today.  She desires to hold on f/u mammogram.

## 2022-06-15 NOTE — Assessment & Plan Note (Signed)
Followed by dermatology

## 2022-07-01 ENCOUNTER — Telehealth: Payer: Self-pay | Admitting: Internal Medicine

## 2022-07-01 NOTE — Telephone Encounter (Signed)
Contacted [name] to schedule their annual wellness visit. Patient declined to schedule AWV at this time. AWV done with [employer] 04/09/2022  Verlee Rossetti; Care Guide Ambulatory Clinical Support [place] [facility] Direct Dial: [phone]

## 2022-07-23 ENCOUNTER — Ambulatory Visit: Payer: Medicare Other | Admitting: Dermatology

## 2022-07-23 VITALS — BP 150/77

## 2022-07-23 DIAGNOSIS — L578 Other skin changes due to chronic exposure to nonionizing radiation: Secondary | ICD-10-CM | POA: Diagnosis not present

## 2022-07-23 DIAGNOSIS — L820 Inflamed seborrheic keratosis: Secondary | ICD-10-CM

## 2022-07-23 DIAGNOSIS — Z79899 Other long term (current) drug therapy: Secondary | ICD-10-CM

## 2022-07-23 DIAGNOSIS — X32XXXA Exposure to sunlight, initial encounter: Secondary | ICD-10-CM | POA: Diagnosis not present

## 2022-07-23 DIAGNOSIS — D1801 Hemangioma of skin and subcutaneous tissue: Secondary | ICD-10-CM

## 2022-07-23 DIAGNOSIS — W908XXA Exposure to other nonionizing radiation, initial encounter: Secondary | ICD-10-CM

## 2022-07-23 DIAGNOSIS — D692 Other nonthrombocytopenic purpura: Secondary | ICD-10-CM

## 2022-07-23 DIAGNOSIS — Z7189 Other specified counseling: Secondary | ICD-10-CM

## 2022-07-23 NOTE — Patient Instructions (Addendum)
Cryotherapy Aftercare  Wash gently with soap and water everyday.   Apply Vaseline and Band-Aid daily until healed.     Due to recent changes in healthcare laws, you may see results of your pathology and/or laboratory studies on MyChart before the doctors have had a chance to review them. We understand that in some cases there may be results that are confusing or concerning to you. Please understand that not all results are received at the same time and often the doctors may need to interpret multiple results in order to provide you with the best plan of care or course of treatment. Therefore, we ask that you please give us 2 business days to thoroughly review all your results before contacting the office for clarification. Should we see a critical lab result, you will be contacted sooner.   If You Need Anything After Your Visit  If you have any questions or concerns for your doctor, please call our main line at [phone] and press option 4 to reach your doctor's medical assistant. If no one answers, please leave a voicemail as directed and we will return your call as soon as possible. Messages left after 4 pm will be answered the following business day.   You may also send us a message via MyChart. We typically respond to MyChart messages within 1-2 business days.  For prescription refills, please ask your pharmacy to contact our office. Our fax number is [phone].  If you have an urgent issue when the clinic is closed that cannot wait until the next business day, you can page your doctor at the number below.    Please note that while we do our best to be available for urgent issues outside of office hours, we are not available 24/7.   If you have an urgent issue and are unable to reach us, you may choose to seek medical care at your doctor's office, retail clinic, urgent care center, or emergency room.  If you have a medical emergency, please immediately call 911 or go to the  emergency department.  Pager Numbers  - Dr. Kowalski: [phone]  - Dr. Moye: [phone]  - Dr. Stewart: [phone]  In the event of inclement weather, please call our main line at [phone] for an update on the status of any delays or closures.  Dermatology Medication Tips: Please keep the boxes that topical medications come in in order to help keep track of the instructions about where and how to use these. Pharmacies typically print the medication instructions only on the boxes and not directly on the medication tubes.   If your medication is too expensive, please contact our office at [phone] option 4 or send us a message through MyChart.   We are unable to tell what your co-pay for medications will be in advance as this is different depending on your insurance coverage. However, we may be able to find a substitute medication at lower cost or fill out paperwork to get insurance to cover a needed medication.   If a prior authorization is required to get your medication covered by your insurance company, please allow us 1-2 business days to complete this process.  Drug prices often vary depending on where the prescription is filled and some pharmacies may offer cheaper prices.  The website www.goodrx.com contains coupons for medications through different pharmacies. The prices here do not account for what the cost may be with help from insurance (it may be cheaper with your insurance), but the website can   give you the price if you did not use any insurance.  - You can print the associated coupon and take it with your prescription to the pharmacy.  - You may also stop by our office during regular business hours and pick up a GoodRx coupon card.  - If you need your prescription sent electronically to a different pharmacy, notify our office through [facility] MyChart or by phone at [phone] option 4.     Si Usted Necesita Algo Despus de Su Visita  Tambin puede  enviarnos un mensaje a travs de MyChart. Por lo general respondemos a los mensajes de MyChart en el transcurso de 1 a 2 das hbiles.  Para renovar recetas, por favor pida a su farmacia que se ponga en contacto con nuestra oficina. Nuestro nmero de fax es el [phone].  Si tiene un asunto urgente cuando la clnica est cerrada y que no puede esperar hasta el siguiente da hbil, puede llamar/localizar a su doctor(a) al nmero que aparece a continuacin.   Por favor, tenga en cuenta que aunque hacemos todo lo posible para estar disponibles para asuntos urgentes fuera del horario de oficina, no estamos disponibles las 24 horas del da, los 7 das de la semana.   Si tiene un problema urgente y no puede comunicarse con nosotros, puede optar por buscar atencin mdica  en el consultorio de su doctor(a), en una clnica privada, en un centro de atencin urgente o en una sala de emergencias.  Si tiene una emergencia mdica, por favor llame inmediatamente al 911 o vaya a la sala de emergencias.  Nmeros de bper  - Dr. Kowalski: [phone]  - Dra. Moye: [phone]  - Dra. Stewart: [phone]  En caso de inclemencias del tiempo, por favor llame a nuestra lnea principal al [phone] para una actualizacin sobre el estado de cualquier retraso o cierre.  Consejos para la medicacin en dermatologa: Por favor, guarde las cajas en las que vienen los medicamentos de uso tpico para ayudarle a seguir las instrucciones sobre dnde y cmo usarlos. Las farmacias generalmente imprimen las instrucciones del medicamento slo en las cajas y no directamente en los tubos del medicamento.   Si su medicamento es muy caro, por favor, pngase en contacto con nuestra oficina llamando al [phone] y presione la opcin 4 o envenos un mensaje a travs de MyChart.   No podemos decirle cul ser su copago por los medicamentos por adelantado ya que esto es diferente dependiendo de la cobertura de su seguro.  Sin embargo, es posible que podamos encontrar un medicamento sustituto a menor costo o llenar un formulario para que el seguro cubra el medicamento que se considera necesario.   Si se requiere una autorizacin previa para que su compaa de seguros cubra su medicamento, por favor permtanos de 1 a 2 das hbiles para completar este proceso.  Los precios de los medicamentos varan con frecuencia dependiendo del lugar de dnde se surte la receta y alguna farmacias pueden ofrecer precios ms baratos.  El sitio web www.goodrx.com tiene cupones para medicamentos de diferentes farmacias. Los precios aqu no tienen en cuenta lo que podra costar con la ayuda del seguro (puede ser ms barato con su seguro), pero el sitio web puede darle el precio si no utiliz ningn seguro.  - Puede imprimir el cupn correspondiente y llevarlo con su receta a la farmacia.  - Tambin puede pasar por nuestra oficina durante el horario de atencin regular y recoger una tarjeta de cupones de GoodRx.  -   Si necesita que su receta se enve electrnicamente a una farmacia diferente, informe a nuestra oficina a travs de MyChart de [facility] o por telfono llamando al [phone] y presione la opcin 4.  

## 2022-07-23 NOTE — Progress Notes (Signed)
   Follow-Up Visit   Subjective  CARA OSSA is a 89 y.o. female who presents for the following: Biopsy follow up right medial forehead above brow and right knee - BCC treated with [facility] 11/14/2021. She has a spot on her right side that she would like checked The patient has spots, moles and lesions to be evaluated, some may be new or changing and the patient may have concern these could be cancer.  The following portions of the chart were reviewed this encounter and updated as appropriate: medications, allergies, medical history  Review of Systems:  No other skin or systemic complaints except as noted in HPI or Assessment and Plan.  Objective  Well appearing patient in no apparent distress; mood and affect are within normal limits. A focused examination was performed of the following areas:  Relevant exam findings are noted in the Assessment and Plan.  Left side infra axillary Erythematous stuck-on, waxy papule or plaque   Assessment & Plan   ACTINIC DAMAGE - chronic, secondary to cumulative UV radiation exposure/sun exposure over time - diffuse scaly erythematous macules with underlying dyspigmentation - Recommend daily broad spectrum sunscreen SPF 30+ to sun-exposed areas, reapply every 2 hours as needed.  - Recommend staying in the shade or wearing long sleeves, sun glasses (UVA+UVB protection) and wide brim hats (4-inch brim around the entire circumference of the hat). - Call for new or changing lesions.  HEMANGIOMA Exam: red papule(s) Discussed benign nature. Recommend observation. Call for changes.  Inflamed seborrheic keratosis Left side infra axillary  Symptomatic, irritating, patient would like treated.  Benign-appearing.  Call clinic for new or changing lesions.   Prior to procedure, discussed risks of blister formation, small wound, skin dyspigmentation, or rare scar following treatment. Recommend Vaseline ointment to treated areas while  healing.   Destruction of lesion - Left side infra axillary Complexity: simple   Destruction method: cryotherapy   Informed consent: discussed and consent obtained   Timeout:  patient name, date of birth, surgical site, and procedure verified Lesion destroyed using liquid nitrogen: Yes   Region frozen until ice ball extended beyond lesion: Yes   Outcome: patient tolerated procedure well with no complications   Post-procedure details: wound care instructions given    Purpura - Chronic; persistent and recurrent.  Treatable, but not curable. - Violaceous macules and patches - Benign - Related to trauma, age, sun damage and/or use of blood thinners, chronic use of topical and/or oral steroids - Observe - Can use OTC arnica containing moisturizer such as Dermend Bruise Formula if desired - Call for worsening or other concerns  Return in about 8 months (around 03/25/2023) for TBSE.  I, Joanie Coddington, CMA, am acting as scribe for Armida Sans, MD .  Documentation: I have reviewed the above documentation for accuracy and completeness, and I agree with the above.  Armida Sans, MD

## 2022-07-30 ENCOUNTER — Encounter: Payer: Self-pay | Admitting: Dermatology

## 2022-10-10 NOTE — Telephone Encounter (Signed)
Error

## 2022-10-13 ENCOUNTER — Other Ambulatory Visit: Payer: Medicare Other

## 2022-10-13 DIAGNOSIS — E1165 Type 2 diabetes mellitus with hyperglycemia: Secondary | ICD-10-CM

## 2022-10-13 DIAGNOSIS — E7800 Pure hypercholesterolemia, unspecified: Secondary | ICD-10-CM

## 2022-10-13 LAB — LIPID PANEL
Cholesterol: 142 mg/dL (ref 0–200)
HDL: 47.9 mg/dL (ref >39.00)
LDL Cholesterol: 73 mg/dL (ref 0–99)
NonHDL: 94.07
Total CHOL/HDL Ratio: 3
Triglycerides: 106 mg/dL (ref 0.0–149.0)
VLDL: 21.2 mg/dL (ref 0.0–40.0)

## 2022-10-13 LAB — HEPATIC FUNCTION PANEL
ALT: 9 U/L (ref 0–35)
AST: 14 U/L (ref 0–37)
Albumin: 3.8 g/dL (ref 3.5–5.2)
Alkaline Phosphatase: 92 U/L (ref 39–117)
Bilirubin, Direct: 0.1 mg/dL (ref 0.0–0.3)
Total Bilirubin: 0.5 mg/dL (ref 0.2–1.2)
Total Protein: 6.7 g/dL (ref 6.0–8.3)

## 2022-10-13 LAB — HEMOGLOBIN A1C: Hgb A1c MFr Bld: 7.5 % — ABNORMAL HIGH (ref 4.6–6.5)

## 2022-10-13 LAB — BASIC METABOLIC PANEL
BUN: 13 mg/dL (ref 6–23)
CO2: 30 mEq/L (ref 19–32)
Calcium: 9.8 mg/dL (ref 8.4–10.5)
Chloride: 102 mEq/L (ref 96–112)
Creatinine, Ser: 0.96 mg/dL (ref 0.40–1.20)
GFR: 52.67 mL/min — ABNORMAL LOW (ref >60.00)
Glucose, Bld: 145 mg/dL — ABNORMAL HIGH (ref 70–99)
Potassium: 4.7 mEq/L (ref 3.5–5.1)
Sodium: 140 mEq/L (ref 135–145)

## 2022-10-15 ENCOUNTER — Encounter: Payer: Medicare Other | Admitting: Internal Medicine

## 2022-10-16 ENCOUNTER — Encounter: Payer: Medicare Other | Admitting: Internal Medicine

## 2022-10-20 ENCOUNTER — Encounter: Payer: Self-pay | Admitting: Internal Medicine

## 2022-10-20 ENCOUNTER — Ambulatory Visit: Payer: Medicare Other | Admitting: Internal Medicine

## 2022-10-20 VITALS — BP 114/70 | HR 61 | Temp 98.1°F | Ht 63.0 in | Wt 136.4 lb

## 2022-10-20 DIAGNOSIS — I10 Essential (primary) hypertension: Secondary | ICD-10-CM

## 2022-10-20 DIAGNOSIS — E1165 Type 2 diabetes mellitus with hyperglycemia: Secondary | ICD-10-CM | POA: Diagnosis not present

## 2022-10-20 DIAGNOSIS — G7000 Myasthenia gravis without (acute) exacerbation: Secondary | ICD-10-CM

## 2022-10-20 DIAGNOSIS — Z23 Encounter for immunization: Secondary | ICD-10-CM

## 2022-10-20 DIAGNOSIS — D0511 Intraductal carcinoma in situ of right breast: Secondary | ICD-10-CM | POA: Diagnosis not present

## 2022-10-20 DIAGNOSIS — F320 Major depressive disorder, single episode, mild: Secondary | ICD-10-CM

## 2022-10-20 DIAGNOSIS — Z0000 Encounter for general adult medical examination without abnormal findings: Secondary | ICD-10-CM

## 2022-10-20 DIAGNOSIS — E7800 Pure hypercholesterolemia, unspecified: Secondary | ICD-10-CM

## 2022-10-20 NOTE — Assessment & Plan Note (Signed)
Physical today 10/20/22.  Mammogram 01/21/20 - Birads I.  Has been seeing Dr Lemar Livings. Declines mammogram.

## 2022-10-20 NOTE — Progress Notes (Signed)
Subjective:    Patient ID: Cynthia [name], female    DOB: [birth year], 89 y.o.   MRN: [id number]  Patient here for  Chief Complaint  Patient presents with   Annual Exam    HPI Here for her physical exam.  Doing relatively well.  Increased stress - still trying to cope with her sister's passing.  Discussed counseling.  She declines.  Will notify me if changes her mind.  No chest pain reported.  Breathing stable.  No increased cough or congestion.  No abdominal pain reported.  Occasionally will have some urgency (stool) - after eating certain foods.  Discussed benefiber.  Some mild unsteady gait - at times.  Discussed PT.  Desires to hold.  Discussed importance of slow position changes and movements.    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 Resected margins negative. Whole breast radiation   Cancer (HCC) 12/15/2012   left upper arm, malignant melanoma Clark level II, Breslow thickness: 0.35 mm.   Diabetes mellitus (HCC)    Hypercholesterolemia    Hypertension    Myasthenia gravis (HCC)    ocular   Personal history of radiation therapy 2014   Rt. Breast   S/P radiation therapy 2015   BREAST CA   Past Surgical History:  Procedure Laterality Date   BREAST BIOPSY Right 2014   DCIS   BREAST LUMPECTOMY Right 2014   Lumpectomy   BREAST SURGERY Right 2014   lumpectomy   EYE SURGERY     Caterac both eyes   Family History  Problem Relation Age of Onset   Skin cancer Father    Cancer Father        colon   Colon cancer Brother    Cancer Brother        colon   Breast cancer Other        niece   Skin cancer Sister    Social History   Socioeconomic History   Marital status: Divorced    Spouse name: Not on file   Number  of children: Not on file   Years of education: Not on file   Highest education level: Not on file  Occupational History   Not on file  Tobacco Use   Smoking status: Never   Smokeless tobacco: Never  Substance and Sexual Activity   Alcohol use: No    Alcohol/week: 0.0 standard drinks of alcohol   Drug use: No   Sexual activity: Never  Other Topics Concern   Not on file  Social History Narrative   Not on file   Social Determinants of Health   Financial Resource Strain: Low Risk  (07/11/2021)   Overall Financial Resource Strain (CARDIA)    Difficulty of Paying Living Expenses: Not hard at all  Food Insecurity: No Food Insecurity (07/11/2021)   Hunger Vital Sign    Worried About Running Out of Food in the Last Year: Never true    Ran Out of Food in the Last Year: Never true  Transportation Needs: No Transportation Needs (07/11/2021)   PRAPARE - Administrator, Civil Service (Medical): No    Lack of Transportation (Non-Medical): No  Physical  Activity: Unknown (07/10/2020)   Exercise Vital Sign    Days of Exercise per Week: 0 days    Minutes of Exercise per Session: Not on file  Stress: No Stress Concern Present (07/11/2021)   [employer] of Occupational Health - Occupational Stress Questionnaire    Feeling of Stress : Not at all  Social Connections: Unknown (07/11/2021)   Social Connection and Isolation Panel [NHANES]    Frequency of Communication with Friends and Family: More than three times a week    Frequency of Social Gatherings with Friends and Family: More than three times a week    Attends Religious Services: Not on Marketing executive or Organizations: Not on file    Attends Banker Meetings: Not on file    Marital Status: Not on file     Review of Systems  Constitutional:  Negative for appetite change and unexpected weight change.  HENT:  Negative for congestion and sinus pressure.   Respiratory:  Negative for cough, chest  tightness and shortness of breath.   Cardiovascular:  Negative for chest pain and palpitations.       No increased swelling.   Gastrointestinal:  Negative for abdominal pain, diarrhea, nausea and vomiting.  Genitourinary:  Negative for difficulty urinating and dysuria.  Musculoskeletal:  Negative for joint swelling and myalgias.  Skin:  Negative for color change and rash.  Neurological:  Negative for dizziness and headaches.  Psychiatric/Behavioral:  Negative for agitation and dysphoric mood.        Objective:     BP 114/70   Pulse 61   Temp 98.1 F (36.7 C)   Ht 5\' 3"  (1.6 m)   Wt 136 lb 6.4 oz (61.9 kg)   SpO2 96%   BMI 24.16 kg/m  Wt Readings from Last 3 Encounters:  10/20/22 136 lb 6.4 oz (61.9 kg)  06/09/22 135 lb 9.6 oz (61.5 kg)  05/08/22 136 lb 9.6 oz (62 kg)    Physical Exam Vitals reviewed.  Constitutional:      General: She is not in acute distress.    Appearance: Normal appearance. She is well-developed.  HENT:     Head: Normocephalic and atraumatic.     Right Ear: External ear normal.     Left Ear: External ear normal.  Eyes:     General: No scleral icterus.       Right eye: No discharge.        Left eye: No discharge.     Conjunctiva/sclera: Conjunctivae normal.  Neck:     Thyroid: No thyromegaly.  Cardiovascular:     Rate and Rhythm: Normal rate and regular rhythm.  Pulmonary:     Effort: No tachypnea, accessory muscle usage or respiratory distress.     Breath sounds: Normal breath sounds. No decreased breath sounds or wheezing.  Chest:  Breasts:    Right: No inverted nipple, mass, nipple discharge or tenderness (no axillary adenopathy).     Left: No inverted nipple, mass, nipple discharge or tenderness (no axilarry adenopathy).  Abdominal:     General: Bowel sounds are normal.     Palpations: Abdomen is soft.     Tenderness: There is no abdominal tenderness.  Musculoskeletal:        General: No swelling or tenderness.     Cervical back:  Neck supple.  Lymphadenopathy:     Cervical: No cervical adenopathy.  Skin:    Findings: No erythema or rash.  Neurological:  Mental Status: She is alert and oriented to person, place, and time.  Psychiatric:        Mood and Affect: Mood normal.        Behavior: Behavior normal.      Outpatient Encounter Medications as of 10/20/2022  Medication Sig   ACCU-CHEK AVIVA PLUS test strip USE AS INSTRUCTED TO CHECK BLOOD SUGAR ONCE DAILY AS NEEDED   ACCU-CHEK SOFTCLIX LANCETS lancets USE ONCE A DAY   amLODipine (NORVASC) 10 MG tablet Take 1 tablet (10 mg total) by mouth daily.   aspirin 81 MG tablet Take 81 mg by mouth daily.   atorvastatin (LIPITOR) 20 MG tablet Take 1 tablet (20 mg total) by mouth daily.   Calcium Carbonate-Vitamin D 600-400 MG-UNIT tablet Take 1 tablet by mouth 2 (two) times daily.   CRANBERRY PO Take by mouth daily.   hydrochlorothiazide (HYDRODIURIL) 25 MG tablet Take 1 tablet (25 mg total) by mouth daily.   ketoconazole (NIZORAL) 2 % cream Apply 1 Application topically at bedtime.   Multiple Vitamins-Minerals (ICAPS AREDS 2) CAPS Take 1 capsule by mouth 2 (two) times daily.   Multiple Vitamins-Minerals (PRESERVISION AREDS 2+MULTI VIT PO) Take by mouth.   potassium chloride (KLOR-CON) 10 MEQ tablet Take 1 tablet (10 mEq total) by mouth 2 (two) times daily.   ramipril (ALTACE) 10 MG capsule Take 1 capsule (10 mg total) by mouth daily.   saccharomyces boulardii (FLORASTOR) 250 MG capsule Take 1 capsule (250 mg total) by mouth daily.   traZODone (DESYREL) 50 MG tablet TAKE 1 TABLET BY MOUTH AT  BEDTIME AS NEEDED FOR SLEEP   No facility-administered encounter medications on file as of 10/20/2022.     Lab Results  Component Value Date   WBC 6.8 06/05/2022   HGB 12.8 06/05/2022   HCT 39.3 06/05/2022   PLT 247.0 06/05/2022   GLUCOSE 145 (H) 10/13/2022   CHOL 142 10/13/2022   TRIG 106.0 10/13/2022   HDL 47.90 10/13/2022   LDLCALC 73 10/13/2022   ALT 9 10/13/2022    AST 14 10/13/2022   NA 140 10/13/2022   K 4.7 10/13/2022   CL 102 10/13/2022   CREATININE 0.96 10/13/2022   BUN 13 10/13/2022   CO2 30 10/13/2022   TSH 1.32 06/05/2022   HGBA1C 7.5 (H) 10/13/2022   MICROALBUR 4.4 (H) 03/03/2022    MR LUMBAR SPINE WO CONTRAST  Result Date: 05/26/2022 CLINICAL DATA:  Low back pain radiating down both legs, right worse than left EXAM: MRI LUMBAR SPINE WITHOUT CONTRAST TECHNIQUE: Multiplanar, multisequence MR imaging of the lumbar spine was performed. No intravenous contrast was administered. COMPARISON:  08/09/2015 FINDINGS: Segmentation:  Standard. Alignment:  2 mm retrolisthesis of L2 on L3, L3 on L4 and L5 on S1. Vertebrae: No acute fracture, evidence of discitis, or aggressive bone lesion. Large vertebral body hemangioma in the L1 vertebral body. Small vertebral body hemangioma in the T12 vertebral body. Conus medullaris and cauda equina: Conus extends to the L1 level. Conus and cauda equina appear normal. Paraspinal and other soft tissues: No acute paraspinal abnormality. Disc levels: Disc spaces: Degenerative disease with disc height loss at T11-12, L1-2, L2-3, L3-4, L5-S1 and to lesser extent L4-5. T11-12: Mild broad-based disc bulge. No foraminal or central canal stenosis. T12-L1: Mild broad-based disc bulge. No foraminal or central canal stenosis. L1-L2: Broad-based disc bulge. Mild bilateral facet arthropathy. Mild spinal stenosis. Mild left foraminal stenosis. No right foraminal stenosis. L2-L3: Broad-based disc bulge. Mild bilateral facet arthropathy. Mild-moderate spinal  stenosis. Bilateral subarticular recess stenosis. Moderate bilateral foraminal stenosis. L3-L4: Broad-based disc bulge. Mild bilateral facet arthropathy. Left subarticular recess stenosis. Severe left foraminal stenosis. No right foraminal stenosis. L4-L5: Broad-based disc bulge with a broad right subarticular disc protrusion impinging upon the right intraspinal L5 nerve root. Severe left  facet arthropathy and mild right facet arthropathy. Mild-moderate spinal stenosis. Left subarticular recess stenosis. Severe left foraminal stenosis. No right foraminal stenosis. L5-S1: Broad-based disc bulge. Mild bilateral facet arthropathy. Bilateral subarticular recess stenosis. Moderate-severe left foraminal stenosis. Mild-moderate right foraminal stenosis. No spinal stenosis. IMPRESSION: 1. Diffuse lumbar spine spondylosis as described above. 2. No acute osseous injury of the lumbar spine. Electronically Signed   By: Elige Ko M.D.   On: 05/26/2022 07:20       Assessment & Plan:  Encounter for immunization -     Flu Vaccine Trivalent High Dose (Fluad)  Health care maintenance Assessment & Plan: Physical today 10/20/22.  Mammogram 01/21/20 - Birads I.  Has been seeing Dr Lemar Livings. Declines mammogram.    Depression, major, single episode, mild (HCC) Assessment & Plan: Increased stress and feeling down - trying to cope with sister's passing.   Discussed.  Offered counseling.  She declines.  No SI.  Does not feel she needs any further intervention at this time.  Follow.    Ductal carcinoma in situ (DCIS) of right breast Assessment & Plan: Saw Dr Lemar Livings 01/29/21.  Mammogram 01/22/21 - Birads I.  Have discussed continuing yearly mammogram. Discussed today.  She desires to hold on f/u mammogram.    Type 2 diabetes mellitus with hyperglycemia, without long-term current use of insulin (HCC) Assessment & Plan: Follow met b and a1c.  Discussed. Lab Results  Component Value Date   HGBA1C 7.5 (H) 10/13/2022     Hypercholesterolemia Assessment & Plan: On lipitor.  Low cholesterol diet and exercise.  Follow lipid panel and liver function tests.     Primary hypertension Assessment & Plan: Blood pressure as outlined.  Continue amlodipine, ramipril and hctz.  No changes made.  Follow pressures.  Follow metabolic panel.    Myasthenia gravis [facility]) Assessment & Plan: Has been followed  by Dr Sherryll Burger.  Stable.       Dale Grayson, MD

## 2022-10-25 ENCOUNTER — Encounter: Payer: Self-pay | Admitting: Internal Medicine

## 2022-10-25 NOTE — Assessment & Plan Note (Signed)
On lipitor.  Low cholesterol diet and exercise.  Follow lipid panel and liver function tests.   

## 2022-10-25 NOTE — Assessment & Plan Note (Signed)
Saw Dr Lemar Livings 01/29/21.  Mammogram 01/22/21 - Birads I.  Have discussed continuing yearly mammogram. Discussed today.  She desires to hold on f/u mammogram.

## 2022-10-25 NOTE — Assessment & Plan Note (Signed)
Increased stress and feeling down - trying to cope with sister's passing.   Discussed.  Offered counseling.  She declines.  No SI.  Does not feel she needs any further intervention at this time.  Follow.

## 2022-10-25 NOTE — Assessment & Plan Note (Signed)
Follow met b and a1c.  Discussed. Lab Results  Component Value Date   HGBA1C 7.5 (H) 10/13/2022

## 2022-10-25 NOTE — Assessment & Plan Note (Signed)
Blood pressure as outlined.  Continue amlodipine, ramipril and hctz.  No changes made.  Follow pressures.  Follow metabolic panel.  

## 2022-10-25 NOTE — Assessment & Plan Note (Signed)
Has been followed by Dr Shah.  Stable.  

## 2022-12-08 ENCOUNTER — Ambulatory Visit: Payer: Medicare Other | Admitting: Dermatology

## 2023-02-03 ENCOUNTER — Other Ambulatory Visit: Payer: Self-pay | Admitting: Family

## 2023-02-19 ENCOUNTER — Telehealth: Payer: Self-pay | Admitting: Internal Medicine

## 2023-02-19 DIAGNOSIS — I10 Essential (primary) hypertension: Secondary | ICD-10-CM

## 2023-02-19 DIAGNOSIS — E7800 Pure hypercholesterolemia, unspecified: Secondary | ICD-10-CM

## 2023-02-19 DIAGNOSIS — E1165 Type 2 diabetes mellitus with hyperglycemia: Secondary | ICD-10-CM

## 2023-02-19 NOTE — Telephone Encounter (Signed)
Patient need lab orders.

## 2023-02-20 NOTE — Telephone Encounter (Signed)
Orders placed for labs

## 2023-02-24 ENCOUNTER — Other Ambulatory Visit: Payer: Medicare Other

## 2023-02-24 DIAGNOSIS — I10 Essential (primary) hypertension: Secondary | ICD-10-CM

## 2023-02-24 DIAGNOSIS — E1165 Type 2 diabetes mellitus with hyperglycemia: Secondary | ICD-10-CM

## 2023-02-24 DIAGNOSIS — E7800 Pure hypercholesterolemia, unspecified: Secondary | ICD-10-CM

## 2023-02-24 LAB — HEPATIC FUNCTION PANEL
ALT: 10 U/L (ref 0–35)
AST: 14 U/L (ref 0–37)
Albumin: 3.5 g/dL (ref 3.5–5.2)
Alkaline Phosphatase: 96 U/L (ref 39–117)
Bilirubin, Direct: 0.1 mg/dL (ref 0.0–0.3)
Total Bilirubin: 0.6 mg/dL (ref 0.2–1.2)
Total Protein: 6.6 g/dL (ref 6.0–8.3)

## 2023-02-24 LAB — BASIC METABOLIC PANEL
BUN: 19 mg/dL (ref 6–23)
CO2: 30 meq/L (ref 19–32)
Calcium: 9.8 mg/dL (ref 8.4–10.5)
Chloride: 102 meq/L (ref 96–112)
Creatinine, Ser: 1.01 mg/dL (ref 0.40–1.20)
GFR: 49.43 mL/min — ABNORMAL LOW (ref >60.00)
Glucose, Bld: 154 mg/dL — ABNORMAL HIGH (ref 70–99)
Potassium: 4.2 meq/L (ref 3.5–5.1)
Sodium: 140 meq/L (ref 135–145)

## 2023-02-24 LAB — LIPID PANEL
Cholesterol: 149 mg/dL (ref 0–200)
HDL: 46.7 mg/dL (ref >39.00)
LDL Cholesterol: 82 mg/dL (ref 0–99)
NonHDL: 102.31
Total CHOL/HDL Ratio: 3
Triglycerides: 100 mg/dL (ref 0.0–149.0)
VLDL: 20 mg/dL (ref 0.0–40.0)

## 2023-02-24 LAB — HEMOGLOBIN A1C: Hgb A1c MFr Bld: 8 % — ABNORMAL HIGH (ref 4.6–6.5)

## 2023-02-25 NOTE — Progress Notes (Signed)
 Subjective:    Patient ID: Cynthia  SKARLET [name], female    DOB: 08/06/1931, 89 y.o.   MRN: [id number]  Patient here for  Chief Complaint  Patient presents with   Medical Management of Chronic Issues    HPI Here for her scheduled follow up - f/u regarding her diabetes, hypertension and hypercholesterolemia. Discussed with her previously regarding increased stress.  Discussed again today. Feels down at times. Discussed further intervention. Will notify me if feels needs anything more. No SI. Reviewed labs.  A1c 8.0. increased from previous check. Discussed diet and exercise. Breathing stable. No chest pain. Bowels stable.    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 Resected margins negative. Whole breast radiation   Cancer (HCC) 12/15/2012   left upper arm, malignant melanoma Clark level II, Breslow thickness: 0.35 mm.   Diabetes mellitus (HCC)    Hypercholesterolemia    Hypertension    Myasthenia gravis (HCC)    ocular   Personal history of radiation therapy 2014   Rt. Breast   S/P radiation therapy 2015   BREAST CA   Past Surgical History:  Procedure Laterality Date   BREAST BIOPSY Right 2014   DCIS   BREAST LUMPECTOMY Right 2014   Lumpectomy   BREAST SURGERY Right 2014   lumpectomy   EYE SURGERY     Caterac both eyes   Family History  Problem Relation Age of Onset   Skin cancer Father    Cancer Father        colon   Colon cancer Brother    Cancer Brother        colon   Breast cancer Other        niece   Skin cancer Sister    Social History   Socioeconomic History   Marital status: Divorced    Spouse name: Not on file   Number of children: Not on file   Years of education: Not on file   Highest education  level: Not on file  Occupational History   Not on file  Tobacco Use   Smoking status: Never   Smokeless tobacco: Never  Substance and Sexual Activity   Alcohol use: No    Alcohol/week: 0.0 standard drinks of alcohol   Drug use: No   Sexual activity: Never  Other Topics Concern   Not on file  Social History Narrative   Not on file   Social Drivers of Health   Financial Resource Strain: Low Risk  (07/11/2021)   Overall Financial Resource Strain (CARDIA)    Difficulty of Paying Living Expenses: Not hard at all  Food Insecurity: No Food Insecurity (07/11/2021)   Hunger Vital Sign    Worried About Running Out of Food in the Last Year: Never true    Ran Out of Food in the Last Year: Never true  Transportation Needs: No Transportation Needs (07/11/2021)   PRAPARE - Administrator, Civil Service (Medical): No    Lack of Transportation (Non-Medical): No  Physical Activity: Unknown (07/10/2020)   Exercise Vital Sign    Days of Exercise per Week: 0 days    Minutes of Exercise per Session: Not on  file  Stress: No Stress Concern Present (07/11/2021)   [employer] of Occupational Health - Occupational Stress Questionnaire    Feeling of Stress : Not at all  Social Connections: Unknown (07/11/2021)   Social Connection and Isolation Panel [NHANES]    Frequency of Communication with Friends and Family: More than three times a week    Frequency of Social Gatherings with Friends and Family: More than three times a week    Attends Religious Services: Not on Marketing Executive or Organizations: Not on file    Attends Banker Meetings: Not on file    Marital Status: Not on file     Review of Systems  Constitutional:  Negative for appetite change and unexpected weight change.  HENT:  Negative for congestion and sinus pressure.   Respiratory:  Negative for cough, chest tightness and shortness of breath.   Cardiovascular:  Negative for chest pain and  palpitations.  Gastrointestinal:  Negative for abdominal pain, diarrhea, nausea and vomiting.  Genitourinary:  Negative for difficulty urinating and dysuria.  Musculoskeletal:  Negative for joint swelling and myalgias.  Skin:  Negative for color change and rash.  Neurological:  Negative for dizziness and headaches.  Psychiatric/Behavioral:  Negative for agitation and dysphoric mood.        Objective:     BP 126/70   Pulse 65   Temp 98 F (36.7 C)   Resp 16   Ht 5' 3 (1.6 m)   Wt 140 lb 6.4 oz (63.7 kg)   SpO2 98%   BMI 24.87 kg/m  Wt Readings from Last 3 Encounters:  02/26/23 140 lb 6.4 oz (63.7 kg)  10/20/22 136 lb 6.4 oz (61.9 kg)  06/09/22 135 lb 9.6 oz (61.5 kg)    Physical Exam Vitals reviewed.  Constitutional:      General: She is not in acute distress.    Appearance: Normal appearance.  HENT:     Head: Normocephalic and atraumatic.     Right Ear: External ear normal.     Left Ear: External ear normal.  Eyes:     General: No scleral icterus.       Right eye: No discharge.        Left eye: No discharge.     Conjunctiva/sclera: Conjunctivae normal.  Neck:     Thyroid : No thyromegaly.  Cardiovascular:     Rate and Rhythm: Normal rate and regular rhythm.  Pulmonary:     Effort: No respiratory distress.     Breath sounds: Normal breath sounds. No wheezing.  Abdominal:     General: Bowel sounds are normal.     Palpations: Abdomen is soft.     Tenderness: There is no abdominal tenderness.  Musculoskeletal:        General: No swelling or tenderness.     Cervical back: Neck supple. No tenderness.  Lymphadenopathy:     Cervical: No cervical adenopathy.  Skin:    Findings: No erythema or rash.  Neurological:     Mental Status: She is alert.  Psychiatric:        Mood and Affect: Mood normal.        Behavior: Behavior normal.      Outpatient Encounter Medications as of 02/26/2023  Medication Sig   ACCU-CHEK AVIVA PLUS test strip USE AS INSTRUCTED TO  CHECK BLOOD SUGAR ONCE DAILY AS NEEDED   ACCU-CHEK SOFTCLIX LANCETS lancets USE ONCE A DAY   amLODipine  (NORVASC ) 10 MG  tablet Take 1 tablet (10 mg total) by mouth daily.   aspirin 81 MG tablet Take 81 mg by mouth daily.   atorvastatin  (LIPITOR) 20 MG tablet Take 1 tablet (20 mg total) by mouth daily.   Calcium  Carbonate-Vitamin D 600-400 MG-UNIT tablet Take 1 tablet by mouth 2 (two) times daily.   CRANBERRY PO Take by mouth daily.   hydrochlorothiazide  (HYDRODIURIL ) 25 MG tablet Take 1 tablet (25 mg total) by mouth daily.   Multiple Vitamins-Minerals (ICAPS AREDS 2) CAPS Take 1 capsule by mouth 2 (two) times daily.   Multiple Vitamins-Minerals (PRESERVISION AREDS 2+MULTI VIT PO) Take by mouth.   potassium chloride  (KLOR-CON ) 10 MEQ tablet Take 1 tablet (10 mEq total) by mouth 2 (two) times daily.   ramipril  (ALTACE ) 10 MG capsule Take 1 capsule (10 mg total) by mouth daily.   traZODone  (DESYREL ) 50 MG tablet TAKE 1 TABLET BY MOUTH AT  BEDTIME AS NEEDED FOR SLEEP   [DISCONTINUED] saccharomyces boulardii (FLORASTOR) 250 MG capsule Take 1 capsule (250 mg total) by mouth daily.   No facility-administered encounter medications on file as of 02/26/2023.     Lab Results  Component Value Date   WBC 6.8 06/05/2022   HGB 12.8 06/05/2022   HCT 39.3 06/05/2022   PLT 247.0 06/05/2022   GLUCOSE 154 (H) 02/24/2023   CHOL 149 02/24/2023   TRIG 100.0 02/24/2023   HDL 46.70 02/24/2023   LDLCALC 82 02/24/2023   ALT 10 02/24/2023   AST 14 02/24/2023   NA 140 02/24/2023   K 4.2 02/24/2023   CL 102 02/24/2023   CREATININE 1.01 02/24/2023   BUN 19 02/24/2023   CO2 30 02/24/2023   TSH 1.32 06/05/2022   HGBA1C 8.0 (H) 02/24/2023   MICROALBUR 4.4 (H) 03/03/2022    MR LUMBAR SPINE WO CONTRAST Result Date: 05/26/2022 CLINICAL DATA:  Low back pain radiating down both legs, right worse than left EXAM: MRI LUMBAR SPINE WITHOUT CONTRAST TECHNIQUE: Multiplanar, multisequence MR imaging of the lumbar spine  was performed. No intravenous contrast was administered. COMPARISON:  08/09/2015 FINDINGS: Segmentation:  Standard. Alignment:  2 mm retrolisthesis of L2 on L3, L3 on L4 and L5 on S1. Vertebrae: No acute fracture, evidence of discitis, or aggressive bone lesion. Large vertebral body hemangioma in the L1 vertebral body. Small vertebral body hemangioma in the T12 vertebral body. Conus medullaris and cauda equina: Conus extends to the L1 level. Conus and cauda equina appear normal. Paraspinal and other soft tissues: No acute paraspinal abnormality. Disc levels: Disc spaces: Degenerative disease with disc height loss at T11-12, L1-2, L2-3, L3-4, L5-S1 and to lesser extent L4-5. T11-12: Mild broad-based disc bulge. No foraminal or central canal stenosis. T12-L1: Mild broad-based disc bulge. No foraminal or central canal stenosis. L1-L2: Broad-based disc bulge. Mild bilateral facet arthropathy. Mild spinal stenosis. Mild left foraminal stenosis. No right foraminal stenosis. L2-L3: Broad-based disc bulge. Mild bilateral facet arthropathy. Mild-moderate spinal stenosis. Bilateral subarticular recess stenosis. Moderate bilateral foraminal stenosis. L3-L4: Broad-based disc bulge. Mild bilateral facet arthropathy. Left subarticular recess stenosis. Severe left foraminal stenosis. No right foraminal stenosis. L4-L5: Broad-based disc bulge with a broad right subarticular disc protrusion impinging upon the right intraspinal L5 nerve root. Severe left facet arthropathy and mild right facet arthropathy. Mild-moderate spinal stenosis. Left subarticular recess stenosis. Severe left foraminal stenosis. No right foraminal stenosis. L5-S1: Broad-based disc bulge. Mild bilateral facet arthropathy. Bilateral subarticular recess stenosis. Moderate-severe left foraminal stenosis. Mild-moderate right foraminal stenosis. No spinal stenosis. IMPRESSION: 1.  Diffuse lumbar spine spondylosis as described above. 2. No acute osseous injury of the  lumbar spine. Electronically Signed   By: Julaine Blanch M.D.   On: 05/26/2022 07:20       Assessment & Plan:  Hypercholesterolemia Assessment & Plan: On lipitor.  Low cholesterol diet and exercise.  Follow lipid panel and liver function tests.     Primary hypertension Assessment & Plan: Blood pressure as outlined.  Continue amlodipine , ramipril  and hctz.  No changes made.  Follow pressures.  Follow metabolic panel.   Orders: -     Basic metabolic panel; Future  Type 2 diabetes mellitus with hyperglycemia, without long-term current use of insulin (HCC) Assessment & Plan: Follow met b and a1c.  Discussed. Lab Results  Component Value Date   HGBA1C 8.0 (H) 02/24/2023     Myasthenia gravis [facility]) Assessment & Plan: Has been followed by Dr Maree previously.  Stable.    History of melanoma Assessment & Plan: Followed by dermatology.     Depression, major, single episode, mild (HCC) Assessment & Plan: Increased stress and feeling down at times.  Discussed.  Offered counseling.  She declines.  No SI.  Does not feel she needs any further intervention at this time.  Follow.    Ductal carcinoma in situ (DCIS) of right breast Assessment & Plan: Saw Dr Dessa 01/29/21.  Mammogram 01/22/21 - Birads I.  Have discussed continuing yearly mammogram. Discussed today.  She desires to hold on f/u mammogram.       Allena Hamilton, MD

## 2023-02-26 ENCOUNTER — Ambulatory Visit: Payer: Medicare Other | Admitting: Internal Medicine

## 2023-02-26 VITALS — BP 126/70 | HR 65 | Temp 98.0°F | Resp 16 | Ht 63.0 in | Wt 140.4 lb

## 2023-02-26 DIAGNOSIS — E7800 Pure hypercholesterolemia, unspecified: Secondary | ICD-10-CM | POA: Diagnosis not present

## 2023-02-26 DIAGNOSIS — D0511 Intraductal carcinoma in situ of right breast: Secondary | ICD-10-CM

## 2023-02-26 DIAGNOSIS — G7000 Myasthenia gravis without (acute) exacerbation: Secondary | ICD-10-CM

## 2023-02-26 DIAGNOSIS — I10 Essential (primary) hypertension: Secondary | ICD-10-CM

## 2023-02-26 DIAGNOSIS — F320 Major depressive disorder, single episode, mild: Secondary | ICD-10-CM

## 2023-02-26 DIAGNOSIS — E1165 Type 2 diabetes mellitus with hyperglycemia: Secondary | ICD-10-CM | POA: Diagnosis not present

## 2023-02-26 DIAGNOSIS — Z85820 Personal history of malignant melanoma of skin: Secondary | ICD-10-CM

## 2023-03-01 ENCOUNTER — Encounter: Payer: Self-pay | Admitting: Internal Medicine

## 2023-03-01 NOTE — Assessment & Plan Note (Signed)
Saw Dr Lemar Livings 01/29/21.  Mammogram 01/22/21 - Birads I.  Have discussed continuing yearly mammogram. Discussed today.  She desires to hold on f/u mammogram.

## 2023-03-01 NOTE — Assessment & Plan Note (Signed)
 On lipitor.  Low cholesterol diet and exercise.  Follow lipid panel and liver function tests.

## 2023-03-01 NOTE — Assessment & Plan Note (Signed)
 Has been followed by Dr Sherryll Burger previously.  Stable.

## 2023-03-01 NOTE — Assessment & Plan Note (Signed)
 Increased stress and feeling down at times.  Discussed.  Offered counseling.  She declines.  No SI.  Does not feel she needs any further intervention at this time.  Follow.

## 2023-03-01 NOTE — Assessment & Plan Note (Signed)
 Followed by dermatology

## 2023-03-01 NOTE — Assessment & Plan Note (Signed)
 Follow met b and a1c.  Discussed. Lab Results  Component Value Date   HGBA1C 8.0 (H) 02/24/2023

## 2023-03-01 NOTE — Assessment & Plan Note (Signed)
Blood pressure as outlined.  Continue amlodipine, ramipril and hctz.  No changes made.  Follow pressures.  Follow metabolic panel.  

## 2023-03-16 ENCOUNTER — Other Ambulatory Visit: Payer: Self-pay | Admitting: Internal Medicine

## 2023-03-26 ENCOUNTER — Other Ambulatory Visit (INDEPENDENT_AMBULATORY_CARE_PROVIDER_SITE_OTHER): Payer: Medicare Other

## 2023-03-26 DIAGNOSIS — I10 Essential (primary) hypertension: Secondary | ICD-10-CM | POA: Diagnosis not present

## 2023-03-26 LAB — BASIC METABOLIC PANEL
BUN: 16 mg/dL (ref 6–23)
CO2: 31 meq/L (ref 19–32)
Calcium: 9.7 mg/dL (ref 8.4–10.5)
Chloride: 97 meq/L (ref 96–112)
Creatinine, Ser: 0.99 mg/dL (ref 0.40–1.20)
GFR: 50.6 mL/min — ABNORMAL LOW (ref >60.00)
Glucose, Bld: 183 mg/dL — ABNORMAL HIGH (ref 70–99)
Potassium: 4 meq/L (ref 3.5–5.1)
Sodium: 134 meq/L — ABNORMAL LOW (ref 135–145)

## 2023-03-30 ENCOUNTER — Other Ambulatory Visit: Payer: Self-pay | Admitting: Internal Medicine

## 2023-03-30 MED ORDER — ACCU-CHEK AVIVA PLUS VI STRP: ORAL_STRIP | 3 refills | Status: DC

## 2023-03-30 NOTE — Telephone Encounter (Signed)
Copied from CRM [id number]. Topic: Clinical - Medication Refill >> Mar 30, 2023 10:56 AM Ernst Spell wrote: Most Recent Primary Care Visit:  Provider: [facility]  Department: [facility]  Visit Type: LAB  Date: 03/26/2023  Medication: Test Strips  Has the patient contacted their pharmacy? Yes Pharmacy   Is this the correct pharmacy for this prescription? Yes If no, delete pharmacy and type the correct one.  This is the patient's preferred pharmacy:  [facility]) - [place], [place] - [address] [facility] [address] [place] [address] [place] [address] Phone: [phone] Fax: [phone]  Has the prescription been filled recently? No  Is the patient out of the medication? Yes  Has the patient been seen for an appointment in the last year OR does the patient have an upcoming appointment? No  Can we respond through MyChart? No  Agent: Please be advised that Rx refills may take up to 3 business days. We ask that you follow-up with your pharmacy.

## 2023-04-01 ENCOUNTER — Other Ambulatory Visit: Payer: Self-pay

## 2023-04-01 DIAGNOSIS — E871 Hypo-osmolality and hyponatremia: Secondary | ICD-10-CM

## 2023-04-02 ENCOUNTER — Other Ambulatory Visit: Payer: Self-pay | Admitting: Internal Medicine

## 2023-04-02 ENCOUNTER — Ambulatory Visit: Payer: Medicare Other | Admitting: Dermatology

## 2023-04-13 ENCOUNTER — Other Ambulatory Visit (INDEPENDENT_AMBULATORY_CARE_PROVIDER_SITE_OTHER): Payer: Medicare Other

## 2023-04-13 DIAGNOSIS — E871 Hypo-osmolality and hyponatremia: Secondary | ICD-10-CM

## 2023-04-14 LAB — SODIUM: Sodium: 135 meq/L (ref 135–145)

## 2023-05-20 ENCOUNTER — Other Ambulatory Visit: Payer: Self-pay | Admitting: Internal Medicine

## 2023-06-08 ENCOUNTER — Other Ambulatory Visit: Payer: Self-pay | Admitting: Internal Medicine

## 2023-06-18 LAB — HM DIABETES EYE EXAM

## 2023-06-26 ENCOUNTER — Ambulatory Visit: Payer: Medicare Other | Admitting: Internal Medicine

## 2023-06-26 VITALS — BP 130/70 | HR 76 | Temp 98.0°F | Resp 16 | Ht 63.0 in | Wt 129.6 lb

## 2023-06-26 DIAGNOSIS — E7800 Pure hypercholesterolemia, unspecified: Secondary | ICD-10-CM | POA: Diagnosis not present

## 2023-06-26 DIAGNOSIS — G7000 Myasthenia gravis without (acute) exacerbation: Secondary | ICD-10-CM | POA: Diagnosis not present

## 2023-06-26 DIAGNOSIS — I10 Essential (primary) hypertension: Secondary | ICD-10-CM

## 2023-06-26 DIAGNOSIS — E1165 Type 2 diabetes mellitus with hyperglycemia: Secondary | ICD-10-CM | POA: Diagnosis not present

## 2023-06-26 DIAGNOSIS — F320 Major depressive disorder, single episode, mild: Secondary | ICD-10-CM

## 2023-06-26 DIAGNOSIS — D0511 Intraductal carcinoma in situ of right breast: Secondary | ICD-10-CM

## 2023-06-26 LAB — LIPID PANEL
Cholesterol: 139 mg/dL (ref 0–200)
HDL: 47.4 mg/dL (ref >39.00)
LDL Cholesterol: 69 mg/dL (ref 0–99)
NonHDL: 91.91
Total CHOL/HDL Ratio: 3
Triglycerides: 116 mg/dL (ref 0.0–149.0)
VLDL: 23.2 mg/dL (ref 0.0–40.0)

## 2023-06-26 LAB — BASIC METABOLIC PANEL WITH GFR
BUN: 13 mg/dL (ref 6–23)
CO2: 31 meq/L (ref 19–32)
Calcium: 10.2 mg/dL (ref 8.4–10.5)
Chloride: 97 meq/L (ref 96–112)
Creatinine, Ser: 0.9 mg/dL (ref 0.40–1.20)
GFR: 56.63 mL/min — ABNORMAL LOW (ref >60.00)
Glucose, Bld: 136 mg/dL — ABNORMAL HIGH (ref 70–99)
Potassium: 4.3 meq/L (ref 3.5–5.1)
Sodium: 136 meq/L (ref 135–145)

## 2023-06-26 LAB — HEMOGLOBIN A1C: Hgb A1c MFr Bld: 7.7 % — ABNORMAL HIGH (ref 4.6–6.5)

## 2023-06-26 LAB — TSH: TSH: 0.51 u[IU]/mL (ref 0.35–5.50)

## 2023-06-26 LAB — HEPATIC FUNCTION PANEL
ALT: 12 U/L (ref 0–35)
AST: 17 U/L (ref 0–37)
Albumin: 4 g/dL (ref 3.5–5.2)
Alkaline Phosphatase: 106 U/L (ref 39–117)
Bilirubin, Direct: 0.1 mg/dL (ref 0.0–0.3)
Total Bilirubin: 0.6 mg/dL (ref 0.2–1.2)
Total Protein: 7.3 g/dL (ref 6.0–8.3)

## 2023-06-26 NOTE — Progress Notes (Unsigned)
 Subjective:    Patient ID: Cynthia [name], female    DOB: Apr 07, 1931, 89 y.o.   MRN: [id number]  Patient here for  Chief Complaint  Patient presents with   Medical Management of Chronic Issues    HPI Here for her scheduled follow up - f/u regarding her diabetes, hypertension and hypercholesterolemia. Denies chest pain. Breathing stable. No abdominal pain or bowel change. Some increased stress/depression. Some decreased social interaction. Some decreased appetite. She is eating. Discussed depression. Discussed treatment. Discussed seeing a counselor or psychiatrist. She declines counseling or psychiatry referral at this time. Discussed suicidal ideations. She assures me she would not kill or hurt herself.    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 Resected margins negative. Whole breast radiation   Cancer (HCC) 12/15/2012   left upper arm, malignant melanoma Clark level II, Breslow thickness: 0.35 mm.   Diabetes mellitus (HCC)    Hypercholesterolemia    Hypertension    Myasthenia gravis (HCC)    ocular   Personal history of radiation therapy 2014   Rt. Breast   S/P radiation therapy 2015   BREAST CA   Past Surgical History:  Procedure Laterality Date   BREAST BIOPSY Right 2014   DCIS   BREAST LUMPECTOMY Right 2014   Lumpectomy   BREAST SURGERY Right 2014   lumpectomy   EYE SURGERY     Caterac both eyes   Family History  Problem Relation Age of Onset   Skin cancer Father    Cancer Father        colon   Colon cancer Brother    Cancer Brother        colon   Breast cancer Other        niece   Skin cancer Sister    Social History   Socioeconomic History   Marital status: Divorced    Spouse name: Not on file    Number of children: Not on file   Years of education: Not on file   Highest education level: Not on file  Occupational History   Not on file  Tobacco Use   Smoking status: Never   Smokeless tobacco: Never  Substance and Sexual Activity   Alcohol use: No    Alcohol/week: 0.0 standard drinks of alcohol   Drug use: No   Sexual activity: Never  Other Topics Concern   Not on file  Social History Narrative   Not on file   Social Drivers of Health   Financial Resource Strain: Low Risk  (07/11/2021)   Overall Financial Resource Strain (CARDIA)    Difficulty of Paying Living Expenses: Not hard at all  Food Insecurity: No Food Insecurity (07/11/2021)   Hunger Vital Sign    Worried About Running Out of Food in the Last Year: Never true    Ran Out of Food in the Last Year: Never true  Transportation Needs: No Transportation Needs (07/11/2021)   PRAPARE - Administrator, Civil Service (Medical): No    Lack of Transportation (Non-Medical): No  Physical Activity: Unknown (07/10/2020)   Exercise Vital Sign    Days of Exercise per Week: 0 days  Minutes of Exercise per Session: Not on file  Stress: No Stress Concern Present (07/11/2021)   [employer] of Occupational Health - Occupational Stress Questionnaire    Feeling of Stress : Not at all  Social Connections: Unknown (07/11/2021)   Social Connection and Isolation Panel [NHANES]    Frequency of Communication with Friends and Family: More than three times a week    Frequency of Social Gatherings with Friends and Family: More than three times a week    Attends Religious Services: Not on Marketing executive or Organizations: Not on file    Attends Banker Meetings: Not on file    Marital Status: Not on file     Review of Systems  Constitutional:        Some decreased appetite. Weight is down. Trended weights for her. Has varied.   HENT:  Negative for congestion and sinus pressure.   Respiratory:   Negative for cough, chest tightness and shortness of breath.   Cardiovascular:  Negative for chest pain and palpitations.       No increased swelling.   Gastrointestinal:  Negative for abdominal pain, nausea and vomiting.  Genitourinary:  Negative for difficulty urinating and dysuria.  Musculoskeletal:  Negative for joint swelling and myalgias.  Skin:  Negative for color change and rash.  Neurological:  Negative for dizziness and headaches.  Psychiatric/Behavioral:         Increased stress and depression as outlined.        Objective:     BP 130/70   Pulse 76   Temp 98 F (36.7 C)   Resp 16   Ht 5\' 3"  (1.6 m)   Wt 129 lb 9.6 oz (58.8 kg)   SpO2 98%   BMI 22.96 kg/m  Wt Readings from Last 3 Encounters:  06/26/23 129 lb 9.6 oz (58.8 kg)  02/26/23 140 lb 6.4 oz (63.7 kg)  10/20/22 136 lb 6.4 oz (61.9 kg)    Physical Exam Vitals reviewed.  Constitutional:      General: She is not in acute distress.    Appearance: Normal appearance.  HENT:     Head: Normocephalic and atraumatic.     Right Ear: External ear normal.     Left Ear: External ear normal.     Mouth/Throat:     Pharynx: No oropharyngeal exudate or posterior oropharyngeal erythema.  Eyes:     General: No scleral icterus.       Right eye: No discharge.        Left eye: No discharge.     Conjunctiva/sclera: Conjunctivae normal.  Neck:     Thyroid : No thyromegaly.  Cardiovascular:     Rate and Rhythm: Normal rate and regular rhythm.  Pulmonary:     Effort: No respiratory distress.     Breath sounds: Normal breath sounds. No wheezing.  Abdominal:     General: Bowel sounds are normal.     Palpations: Abdomen is soft.     Tenderness: There is no abdominal tenderness.  Musculoskeletal:        General: No swelling or tenderness.     Cervical back: Neck supple. No tenderness.  Lymphadenopathy:     Cervical: No cervical adenopathy.  Skin:    Findings: No erythema or rash.  Neurological:     Mental Status:  She is alert.  Psychiatric:        Mood and Affect: Mood normal.  Behavior: Behavior normal.         Outpatient Encounter Medications as of 06/26/2023  Medication Sig   ACCU-CHEK SOFTCLIX LANCETS lancets USE ONCE A DAY   amLODipine  (NORVASC ) 10 MG tablet TAKE 1 TABLET BY MOUTH DAILY   aspirin 81 MG tablet Take 81 mg by mouth daily.   atorvastatin  (LIPITOR) 20 MG tablet TAKE 1 TABLET BY MOUTH DAILY   Calcium  Carbonate-Vitamin D 600-400 MG-UNIT tablet Take 1 tablet by mouth 2 (two) times daily.   CRANBERRY PO Take by mouth daily.   glucose blood (ACCU-CHEK AVIVA PLUS) test strip USE AS INSTRUCTED TO CHECK  BLOOD SUGAR ONCE DAILY AS  NEEDED   hydrochlorothiazide  (HYDRODIURIL ) 25 MG tablet TAKE 1 TABLET BY MOUTH DAILY   Multiple Vitamins-Minerals (ICAPS AREDS 2) CAPS Take 1 capsule by mouth 2 (two) times daily.   Multiple Vitamins-Minerals (PRESERVISION AREDS 2+MULTI VIT PO) Take by mouth.   potassium chloride  (KLOR-CON ) 10 MEQ tablet TAKE 1 TABLET BY MOUTH TWICE  DAILY   ramipril  (ALTACE ) 10 MG capsule TAKE 1 CAPSULE BY MOUTH DAILY   traZODone  (DESYREL ) 50 MG tablet TAKE 1 TABLET BY MOUTH AT  BEDTIME AS NEEDED FOR SLEEP   No facility-administered encounter medications on file as of 06/26/2023.     Lab Results  Component Value Date   WBC 7.4 06/26/2023   HGB 13.1 06/26/2023   HCT 40.7 06/26/2023   PLT 246.0 06/26/2023   GLUCOSE 136 (H) 06/26/2023   CHOL 139 06/26/2023   TRIG 116.0 06/26/2023   HDL 47.40 06/26/2023   LDLCALC 69 06/26/2023   ALT 12 06/26/2023   AST 17 06/26/2023   NA 136 06/26/2023   K 4.3 06/26/2023   CL 97 06/26/2023   CREATININE 0.90 06/26/2023   BUN 13 06/26/2023   CO2 31 06/26/2023   TSH 0.51 06/26/2023   HGBA1C 7.7 (H) 06/26/2023   MICROALBUR 4.4 (H) 03/03/2022    MR LUMBAR SPINE WO CONTRAST Result Date: 05/26/2022 CLINICAL DATA:  Low back pain radiating down both legs, right worse than left EXAM: MRI LUMBAR SPINE WITHOUT CONTRAST TECHNIQUE:  Multiplanar, multisequence MR imaging of the lumbar spine was performed. No intravenous contrast was administered. COMPARISON:  08/09/2015 FINDINGS: Segmentation:  Standard. Alignment:  2 mm retrolisthesis of L2 on L3, L3 on L4 and L5 on S1. Vertebrae: No acute fracture, evidence of discitis, or aggressive bone lesion. Large vertebral body hemangioma in the L1 vertebral body. Small vertebral body hemangioma in the T12 vertebral body. Conus medullaris and cauda equina: Conus extends to the L1 level. Conus and cauda equina appear normal. Paraspinal and other soft tissues: No acute paraspinal abnormality. Disc levels: Disc spaces: Degenerative disease with disc height loss at T11-12, L1-2, L2-3, L3-4, L5-S1 and to lesser extent L4-5. T11-12: Mild broad-based disc bulge. No foraminal or central canal stenosis. T12-L1: Mild broad-based disc bulge. No foraminal or central canal stenosis. L1-L2: Broad-based disc bulge. Mild bilateral facet arthropathy. Mild spinal stenosis. Mild left foraminal stenosis. No right foraminal stenosis. L2-L3: Broad-based disc bulge. Mild bilateral facet arthropathy. Mild-moderate spinal stenosis. Bilateral subarticular recess stenosis. Moderate bilateral foraminal stenosis. L3-L4: Broad-based disc bulge. Mild bilateral facet arthropathy. Left subarticular recess stenosis. Severe left foraminal stenosis. No right foraminal stenosis. L4-L5: Broad-based disc bulge with a broad right subarticular disc protrusion impinging upon the right intraspinal L5 nerve root. Severe left facet arthropathy and mild right facet arthropathy. Mild-moderate spinal stenosis. Left subarticular recess stenosis. Severe left foraminal stenosis. No right foraminal stenosis. L5-S1:  Broad-based disc bulge. Mild bilateral facet arthropathy. Bilateral subarticular recess stenosis. Moderate-severe left foraminal stenosis. Mild-moderate right foraminal stenosis. No spinal stenosis. IMPRESSION: 1. Diffuse lumbar spine  spondylosis as described above. 2. No acute osseous injury of the lumbar spine. Electronically Signed   By: Onnie Bilis M.D.   On: 05/26/2022 07:20       Assessment & Plan:  Myasthenia gravis [facility]) Assessment & Plan: Has been followed by Dr Mason Sole previously.  Stable.    Hypercholesterolemia Assessment & Plan: On lipitor.  Low cholesterol diet and exercise.  Follow lipid panel and liver function tests.  Check with labs today. No changes today.   Orders: -     Lipid panel -     Hepatic function panel -     CBC with Differential/Platelet -     TSH  Type 2 diabetes mellitus with hyperglycemia, without long-term current use of insulin (HCC) Assessment & Plan: Follow met b and A1c.  Check today. Discussed importance of eating regular meals.   Orders: -     Hemoglobin A1c  Primary hypertension Assessment & Plan: Blood pressure as outlined.  Continue amlodipine , ramipril  and hctz. No changes today. Follow pressures. Check metabolic panel with todays labs.   Orders: -     Basic metabolic panel with GFR  Depression, major, single episode, mild (HCC) Assessment & Plan: Increased stress and feeling down at times.  Discussed psychiatry referral. Discussed counseling. She declines at this time. Assures me she would not harm/kill herself. Desires no further intervention at this time. Discussed assisted living. Discussed social work referral for help in the home.  Will notify me if desires referral.    Ductal carcinoma in situ (DCIS) of right breast Assessment & Plan: Saw Dr Marquita Situ 01/29/21.  Mammogram 01/22/21 - Birads I.  Have discussed continuing yearly mammograms. She declines further mammogram testing.       Dellar Fenton, MD

## 2023-06-27 LAB — CBC WITH DIFFERENTIAL/PLATELET
Basophils Absolute: 0.1 10*3/uL (ref 0.0–0.1)
Basophils Relative: 0.8 % (ref 0.0–3.0)
Eosinophils Absolute: 0.1 10*3/uL (ref 0.0–0.7)
Eosinophils Relative: 1.8 % (ref 0.0–5.0)
HCT: 40.7 % (ref 36.0–46.0)
Hemoglobin: 13.1 g/dL (ref 12.0–15.0)
Lymphocytes Relative: 23.9 % (ref 12.0–46.0)
Lymphs Abs: 1.8 10*3/uL (ref 0.7–4.0)
MCHC: 32.2 g/dL (ref 30.0–36.0)
MCV: 85.4 fl (ref 78.0–100.0)
Monocytes Absolute: 0.7 10*3/uL (ref 0.1–1.0)
Monocytes Relative: 9.4 % (ref 3.0–12.0)
Neutro Abs: 4.7 10*3/uL (ref 1.4–7.7)
Neutrophils Relative %: 64.1 % (ref 43.0–77.0)
Platelets: 246 10*3/uL (ref 150.0–400.0)
RBC: 4.76 Mil/uL (ref 3.87–5.11)
RDW: 15.1 % (ref 11.5–15.5)
WBC: 7.4 10*3/uL (ref 4.0–10.5)

## 2023-06-28 ENCOUNTER — Encounter: Payer: Self-pay | Admitting: Internal Medicine

## 2023-06-28 NOTE — Assessment & Plan Note (Signed)
 Saw Dr Marquita Situ 01/29/21.  Mammogram 01/22/21 - Birads I.  Have discussed continuing yearly mammograms. She declines further mammogram testing.

## 2023-06-28 NOTE — Assessment & Plan Note (Signed)
 Blood pressure as outlined.  Continue amlodipine , ramipril  and hctz. No changes today. Follow pressures. Check metabolic panel with todays labs.

## 2023-06-28 NOTE — Assessment & Plan Note (Signed)
 Increased stress and feeling down at times.  Discussed psychiatry referral. Discussed counseling. She declines at this time. Assures me she would not harm/kill herself. Desires no further intervention at this time. Discussed assisted living. Discussed social work referral for help in the home.  Will notify me if desires referral.

## 2023-06-28 NOTE — Assessment & Plan Note (Signed)
 Follow met b and A1c.  Check today. Discussed importance of eating regular meals.

## 2023-06-28 NOTE — Assessment & Plan Note (Signed)
 On lipitor.  Low cholesterol diet and exercise.  Follow lipid panel and liver function tests.  Check with labs today. No changes today.

## 2023-06-28 NOTE — Assessment & Plan Note (Signed)
 Has been followed by Dr Sherryll Burger previously.  Stable.

## 2023-06-29 ENCOUNTER — Encounter: Payer: Self-pay | Admitting: Internal Medicine

## 2023-07-27 ENCOUNTER — Ambulatory Visit

## 2023-07-29 ENCOUNTER — Ambulatory Visit: Payer: Medicare Other | Admitting: Dermatology

## 2023-07-30 ENCOUNTER — Ambulatory Visit: Admitting: Dermatology

## 2023-10-07 ENCOUNTER — Ambulatory Visit: Admitting: Dermatology

## 2023-10-21 ENCOUNTER — Ambulatory Visit

## 2023-10-23 ENCOUNTER — Telehealth: Payer: Self-pay

## 2023-10-23 NOTE — Telephone Encounter (Signed)
 Copied from CRM [id number]. Topic: General - Other >> Oct 23, 2023 12:20 PM Macario HERO wrote: Reason for CRM: Patient is requesting a call from Trish, patient wants to know why she was scheduled with a different provider. Advised she will receive a call before the end of the day.

## 2023-10-23 NOTE — Telephone Encounter (Signed)
 Return the patient's call. Patient says she has been concerns with her blood sugars reading which have been between 130-140, but not consistent days. Patient kept saying she wanted to see Dr Glendia and does not want NP, Vincente. Patient says she saw Vincente, NP before for a UTI and was not happy with her care, as provider took a while. Patient says she would really appreciate it if she could see Dr Glendia because that's who she thought she scheduling to see for her follow up. Patient was advise keep her appointment for Tuesday as it was first available. Please advise?

## 2023-10-24 NOTE — Telephone Encounter (Signed)
 Apparently Cynthia [name]'s 4 month f/u appt was not scheduled with me. Please schedule her a f/u appt with me.SABRA

## 2023-10-27 ENCOUNTER — Encounter: Payer: Self-pay | Admitting: Nurse Practitioner

## 2023-10-27 ENCOUNTER — Ambulatory Visit (INDEPENDENT_AMBULATORY_CARE_PROVIDER_SITE_OTHER): Admitting: Nurse Practitioner

## 2023-10-27 VITALS — BP 106/74 | HR 70 | Temp 97.7°F | Ht 63.0 in | Wt 135.2 lb

## 2023-10-27 DIAGNOSIS — R21 Rash and other nonspecific skin eruption: Secondary | ICD-10-CM

## 2023-10-27 DIAGNOSIS — N3001 Acute cystitis with hematuria: Secondary | ICD-10-CM

## 2023-10-27 DIAGNOSIS — R300 Dysuria: Secondary | ICD-10-CM | POA: Diagnosis not present

## 2023-10-27 LAB — POC URINALSYSI DIPSTICK (AUTOMATED)
Bilirubin, UA: NEGATIVE
Glucose, UA: NEGATIVE
Ketones, UA: NEGATIVE
Nitrite, UA: NEGATIVE
Protein, UA: NEGATIVE
Spec Grav, UA: 1.01 (ref 1.010–1.025)
Urobilinogen, UA: 0.2 U/dL
pH, UA: 6.5 (ref 5.0–8.0)

## 2023-10-27 MED ORDER — CEPHALEXIN 500 MG PO CAPS: 500.0000 mg | ORAL_CAPSULE | Freq: Two times a day (BID) | ORAL | 0 refills | Status: DC

## 2023-10-27 MED ORDER — LORATADINE 10 MG PO TABS: 10.0000 mg | ORAL_TABLET | Freq: Every day | ORAL | 0 refills | Status: DC

## 2023-10-27 MED ORDER — HYDROCORTISONE 1 % EX OINT: 1.0000 | TOPICAL_OINTMENT | Freq: Two times a day (BID) | CUTANEOUS | 0 refills | Status: DC

## 2023-10-27 NOTE — Telephone Encounter (Signed)
 Late entry: Called patient to let her know that she did not need to have a follow up with Charan. Rescheduled her appt with Dr Glendia next week. While on the phone, pt noted that she is having urinary symptoms. Pt is going to keep appt with Charan today for acute visit and see Dr Glendia next week for her regular follow up.

## 2023-10-27 NOTE — Progress Notes (Signed)
 Established Patient Office Visit  Subjective:  Patient ID: Cynthia  Cynthia [name], female    DOB: 1931/06/25  Age: 89 y.o. MRN: [id number]  CC:  Chief Complaint  Patient presents with   Urinary Tract Infection    Burning x 1 week   Discussed the use of a AI scribe software for clinical note transcription with the patient, who gave verbal consent to proceed.  HPI  Cynthia  S [name] is an 89 year old female who presents with symptoms of a urinary tract infection and a skin rash.  She has experienced UTI  symptoms for a week, including malodorous urine and a burning sensation. She avoided over-the-counter medication due to concerns about urine discoloration. A similar episode in March 2024 was treated with ciprofloxacin .  A skin rash appeared a week ago, with non-itchy red spots on her leg, back, and other areas. She has tried OTC creams have not improved the rash. Denise any fall, injury. She suspects laundry tablets might be related and has tried cortisone cream.   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 Resected margins negative. Whole breast radiation   Cancer (HCC) 12/15/2012   left upper arm, malignant melanoma Clark level II, Breslow thickness: 0.35 mm.   Diabetes mellitus (HCC)    Hypercholesterolemia    Hypertension    Myasthenia gravis (HCC)    ocular   Personal history of radiation therapy 2014   Rt. Breast   S/P radiation therapy 2015   BREAST CA    Past Surgical History:  Procedure Laterality Date   BREAST BIOPSY Right 2014   DCIS   BREAST LUMPECTOMY Right 2014   Lumpectomy   BREAST SURGERY Right 2014   lumpectomy   EYE SURGERY     Caterac both eyes    Family History  Problem Relation Age of Onset   Skin  cancer Father    Cancer Father        colon   Colon cancer Brother    Cancer Brother        colon   Breast cancer Other        niece   Skin cancer Sister     Social History   Socioeconomic History   Marital status: Divorced    Spouse name: Not on file   Number of children: Not on file   Years of education: Not on file   Highest education level: Not on file  Occupational History   Not on file  Tobacco Use   Smoking status: Never   Smokeless tobacco: Never  Substance and Sexual Activity   Alcohol use: No    Alcohol/week: 0.0 standard drinks of alcohol   Drug use: No   Sexual activity: Never  Other Topics Concern   Not on file  Social History Narrative   Not on file   Social Drivers of Health   Financial Resource Strain: Low Risk  (08/10/2023)   Received from [facility]   Overall Financial Resource Strain (CARDIA)    Difficulty of Paying Living Expenses: Not hard at all  Food Insecurity: No Food Insecurity (08/10/2023)   Received from [facility]   Hunger  Vital Sign    Within the past 12 months, you worried that your food would run out before you got the money to buy more.: Never true    Within the past 12 months, the food you bought just didn't last and you didn't have money to get more.: Never true  Transportation Needs: Unmet Transportation Needs (08/10/2023)   Received from [facility] - Transportation    In the past 12 months, has lack of transportation kept you from medical appointments or from getting medications?: Yes    Lack of Transportation (Non-Medical): Yes  Physical Activity: Unknown (07/10/2020)   Exercise Vital Sign    Days of Exercise per Week: 0 days    Minutes of Exercise per Session: Not on file  Stress: No Stress Concern Present (07/11/2021)   [employer] of Occupational Health - Occupational Stress Questionnaire    Feeling of Stress : Not at all  Social Connections: Unknown  (07/11/2021)   Social Connection and Isolation Panel    Frequency of Communication with Friends and Family: More than three times a week    Frequency of Social Gatherings with Friends and Family: More than three times a week    Attends Religious Services: Not on file    Active Member of Clubs or Organizations: Not on file    Attends Banker Meetings: Not on file    Marital Status: Not on file  Intimate Partner Violence: Not At Risk (07/11/2021)   Humiliation, Afraid, Rape, and Kick questionnaire    Fear of Current or Ex-Partner: No    Emotionally Abused: No    Physically Abused: No    Sexually Abused: No     Outpatient Medications Prior to Visit  Medication Sig Dispense Refill   ACCU-CHEK SOFTCLIX LANCETS lancets USE ONCE A DAY 100 each 3   amLODipine  (NORVASC ) 10 MG tablet TAKE 1 TABLET BY MOUTH DAILY 90 tablet 3   aspirin 81 MG tablet Take 81 mg by mouth daily.     atorvastatin  (LIPITOR) 20 MG tablet TAKE 1 TABLET BY MOUTH DAILY 90 tablet 3   Calcium  Carbonate-Vitamin D 600-400 MG-UNIT tablet Take 1 tablet by mouth 2 (two) times daily.     CRANBERRY PO Take by mouth daily.     glucose blood (ACCU-CHEK AVIVA PLUS) test strip USE AS INSTRUCTED TO CHECK  BLOOD SUGAR ONCE DAILY AS  NEEDED 100 strip 3   hydrochlorothiazide  (HYDRODIURIL ) 25 MG tablet TAKE 1 TABLET BY MOUTH DAILY 90 tablet 3   Multiple Vitamins-Minerals (ICAPS AREDS 2) CAPS Take 1 capsule by mouth 2 (two) times daily.     Multiple Vitamins-Minerals (PRESERVISION AREDS 2+MULTI VIT PO) Take by mouth.     potassium chloride  (KLOR-CON ) 10 MEQ tablet TAKE 1 TABLET BY MOUTH TWICE  DAILY 180 tablet 3   ramipril  (ALTACE ) 10 MG capsule TAKE 1 CAPSULE BY MOUTH DAILY 90 capsule 3   traZODone  (DESYREL ) 50 MG tablet TAKE 1 TABLET BY MOUTH AT  BEDTIME AS NEEDED FOR SLEEP 90 tablet 3   No facility-administered medications prior to visit.    Allergies  Allergen Reactions   No Known Drug Allergy     ROS Review of  Systems Negative unless indicated in HPI.    Objective:    Physical Exam Constitutional:      Appearance: Normal appearance.  Cardiovascular:     Rate and Rhythm: Normal rate and regular rhythm.     Pulses: Normal pulses.  Heart sounds: Normal heart sounds.  Abdominal:     General: Bowel sounds are normal.     Palpations: Abdomen is soft.     Tenderness: There is no abdominal tenderness. There is no right CVA tenderness or left CVA tenderness.  Musculoskeletal:     Cervical back: Normal range of motion.  Skin:    Findings: Rash present.     Comments: Scattered erythematous macular rash over the legs, arms and back  Neurological:     General: No focal deficit present.     Mental Status: She is alert. Mental status is at baseline.  Psychiatric:        Mood and Affect: Mood normal.        Behavior: Behavior normal.        Thought Content: Thought content normal.        Judgment: Judgment normal.     BP 106/74   Pulse 70   Temp 97.7 F (36.5 C)   Ht 5' 3 (1.6 m)   Wt 135 lb 3.2 oz (61.3 kg)   SpO2 95%   BMI 23.95 kg/m  Wt Readings from Last 3 Encounters:  10/27/23 135 lb 3.2 oz (61.3 kg)  06/26/23 129 lb 9.6 oz (58.8 kg)  02/26/23 140 lb 6.4 oz (63.7 kg)     Health Maintenance  Topic Date Due   Pneumococcal Vaccine: 50+ Years (2 of 2 - PPSV23, PCV20, or PCV21) 01/18/2014   FOOT EXAM  04/10/2021   Medicare Annual Wellness (AWV)  07/12/2022   COVID-19 Vaccine (5 - 2025-26 season) 11/12/2023 (Originally 10/26/2023)   Influenza Vaccine  05/24/2024 (Originally 09/25/2023)   MAMMOGRAM  06/25/2024 (Originally 01/21/2022)   HEMOGLOBIN A1C  12/27/2023   OPHTHALMOLOGY EXAM  06/17/2024   DEXA SCAN  Completed   Zoster Vaccines- Shingrix  Completed   HPV VACCINES  Aged Out   Meningococcal B Vaccine  Aged Out   DTaP/Tdap/Td  Discontinued    There are no preventive care reminders to display for this patient.  Lab Results  Component Value Date   TSH 0.51 06/26/2023    Lab Results  Component Value Date   WBC 7.4 06/26/2023   HGB 13.1 06/26/2023   HCT 40.7 06/26/2023   MCV 85.4 06/26/2023   PLT 246.0 06/26/2023   Lab Results  Component Value Date   NA 136 06/26/2023   K 4.3 06/26/2023   CO2 31 06/26/2023   GLUCOSE 136 (H) 06/26/2023   BUN 13 06/26/2023   CREATININE 0.90 06/26/2023   BILITOT 0.6 06/26/2023   ALKPHOS 106 06/26/2023   AST 17 06/26/2023   ALT 12 06/26/2023   PROT 7.3 06/26/2023   ALBUMIN 4.0 06/26/2023   CALCIUM  10.2 06/26/2023   GFR 56.63 (L) 06/26/2023   Lab Results  Component Value Date   CHOL 139 06/26/2023   Lab Results  Component Value Date   HDL 47.40 06/26/2023   Lab Results  Component Value Date   LDLCALC 69 06/26/2023   Lab Results  Component Value Date   TRIG 116.0 06/26/2023   Lab Results  Component Value Date   CHOLHDL 3 06/26/2023   Lab Results  Component Value Date   HGBA1C 7.7 (H) 06/26/2023      Assessment & Plan:  Acute cystitis with hematuria Assessment & Plan: Dipstick positive for leukocyte and trace blood, negative for nitrite. -Urine culture positive for E. coli. -Continue cephalexin  twice daily for 5 days. -Increase fluid intake.    Dysuria -  POCT Urinalysis Dipstick (Automated) -     Urine Culture -     Urinalysis, Routine w reflex microscopic  Rash Assessment & Plan: Erythematous rash on legs, arm and back. - Prescribe hydrocortisone  cream for topical application. - Recommend cold packs for symptom relief. - Discuss use of oral antihistamines like cetirizine for itching, to be taken at night. - Patient will let us  know if symptoms are not improving will start on Medrol Dosepak.   Other orders -     Hydrocortisone ; Apply 1 Application topically 2 (two) times daily.  Dispense: 56 g; Refill: 0    Follow-up: Return if symptoms worsen or fail to improve.   Christeen Lai, NP

## 2023-10-27 NOTE — Telephone Encounter (Signed)
 Front staff: Disregard this message. I have taken care of it. Will document detailed note in this encounter

## 2023-10-28 ENCOUNTER — Other Ambulatory Visit: Payer: Self-pay

## 2023-10-28 ENCOUNTER — Telehealth: Payer: Self-pay

## 2023-10-28 LAB — URINALYSIS, ROUTINE W REFLEX MICROSCOPIC
Bilirubin Urine: NEGATIVE
Ketones, ur: NEGATIVE
Nitrite: NEGATIVE
Specific Gravity, Urine: <=1.005 — AB (ref 1.000–1.030)
Total Protein, Urine: NEGATIVE
Urine Glucose: NEGATIVE
Urobilinogen, UA: 0.2 (ref 0.0–1.0)
pH: 6.5 (ref 5.0–8.0)

## 2023-10-28 MED ORDER — LORATADINE 10 MG PO TABS: 10.0000 mg | ORAL_TABLET | Freq: Every day | ORAL | 0 refills | Status: DC

## 2023-10-28 MED ORDER — HYDROCORTISONE 1 % EX OINT: 1.0000 | TOPICAL_OINTMENT | Freq: Two times a day (BID) | CUTANEOUS | 0 refills | Status: DC

## 2023-10-28 MED ORDER — CEPHALEXIN 500 MG PO CAPS: 500.0000 mg | ORAL_CAPSULE | Freq: Two times a day (BID) | ORAL | 0 refills | Status: DC

## 2023-10-28 NOTE — Telephone Encounter (Signed)
 Called Patient to find out what the issue was with the medications then called [facility] and the Pharmacist stated their  computers were having issues over half of the day on 10/27/23. [facility] stated they did not have the prescriptions so I resent them. [facility] states they will get the [facility] ready for the Patient.

## 2023-10-28 NOTE — Telephone Encounter (Signed)
 Copied from CRM #[id number]. Topic: Clinical - Prescription Issue >> Oct 28, 2023  8:00 AM Deleta RAMAN wrote: Reason for CRM: patient was not able to get her prescription she does not know the name. Medication was prescribed from yesterday visit.

## 2023-10-29 ENCOUNTER — Other Ambulatory Visit: Payer: Self-pay | Admitting: Nurse Practitioner

## 2023-10-29 ENCOUNTER — Ambulatory Visit: Payer: Self-pay | Admitting: Nurse Practitioner

## 2023-10-29 LAB — URINE CULTURE
MICRO NUMBER:: 16910178
SPECIMEN QUALITY:: ADEQUATE

## 2023-10-29 MED ORDER — HYDROCORTISONE 1 % EX OINT: 1.0000 | TOPICAL_OINTMENT | Freq: Two times a day (BID) | CUTANEOUS | 1 refills | Status: DC

## 2023-10-29 NOTE — Telephone Encounter (Signed)
 I have send  the larger quantity. She is supposed to use it twice a day not more than that. I have also resulted her Culture result. Please inform the pt.

## 2023-10-29 NOTE — Progress Notes (Signed)
 Please inform pt the culture is positive for UTI and she should complete the antibiotic prescribed. She should let us  know if symptoms does not improve after completing the antibiotic.

## 2023-10-29 NOTE — Telephone Encounter (Signed)
 Patient is aware.

## 2023-10-29 NOTE — Telephone Encounter (Signed)
 Pt saw you for this. Can we send in larger quantity?

## 2023-10-29 NOTE — Telephone Encounter (Unsigned)
 Copied from CRM [id number]. Topic: Clinical - Medication Question >> Oct 29, 2023  2:28 PM Mia F wrote: Reason for CRM: Pt says the rx for the hydrocortisone  1 % ointment that was sent for her is not enough for her to use for her whole body. She is to use it 5 times a day and the tube is very small. She is asking for a call from the doctor or nurse to discuss her questions and concerns

## 2023-10-30 ENCOUNTER — Other Ambulatory Visit: Payer: Self-pay

## 2023-10-30 MED ORDER — HYDROCORTISONE 1 % EX OINT: 1.0000 | TOPICAL_OINTMENT | Freq: Two times a day (BID) | CUTANEOUS | 1 refills | Status: DC

## 2023-10-30 NOTE — Progress Notes (Signed)
 I have sent Hydrocortisone  yesterday. Please advise her to use no more than twice daily.

## 2023-11-02 ENCOUNTER — Ambulatory Visit: Payer: Self-pay

## 2023-11-02 NOTE — Telephone Encounter (Signed)
 Copied from CRM [id number]. Topic: Clinical - Medication Question >> Nov 02, 2023  3:32 PM Suzen RAMAN wrote: Reason for CRM: Per patient pharmacy would like to know if she can get the cream form of hydrocortisone  1 % ointment. And quantity 454 Grams  since the rash is all over patient body.    CB#[phone]

## 2023-11-04 ENCOUNTER — Ambulatory Visit: Admitting: Internal Medicine

## 2023-11-04 ENCOUNTER — Encounter: Payer: Self-pay | Admitting: Internal Medicine

## 2023-11-04 ENCOUNTER — Encounter: Payer: Self-pay | Admitting: Nurse Practitioner

## 2023-11-04 VITALS — BP 128/70 | HR 90 | Resp 16 | Ht 63.0 in | Wt 133.0 lb

## 2023-11-04 DIAGNOSIS — R21 Rash and other nonspecific skin eruption: Secondary | ICD-10-CM

## 2023-11-04 DIAGNOSIS — E1165 Type 2 diabetes mellitus with hyperglycemia: Secondary | ICD-10-CM | POA: Diagnosis not present

## 2023-11-04 DIAGNOSIS — E7800 Pure hypercholesterolemia, unspecified: Secondary | ICD-10-CM | POA: Diagnosis not present

## 2023-11-04 DIAGNOSIS — D0511 Intraductal carcinoma in situ of right breast: Secondary | ICD-10-CM

## 2023-11-04 DIAGNOSIS — I10 Essential (primary) hypertension: Secondary | ICD-10-CM

## 2023-11-04 DIAGNOSIS — R300 Dysuria: Secondary | ICD-10-CM | POA: Diagnosis not present

## 2023-11-04 DIAGNOSIS — N3001 Acute cystitis with hematuria: Secondary | ICD-10-CM | POA: Insufficient documentation

## 2023-11-04 DIAGNOSIS — G7000 Myasthenia gravis without (acute) exacerbation: Secondary | ICD-10-CM

## 2023-11-04 DIAGNOSIS — F320 Major depressive disorder, single episode, mild: Secondary | ICD-10-CM

## 2023-11-04 LAB — LIPID PANEL
Cholesterol: 132 mg/dL (ref 0–200)
HDL: 59 mg/dL (ref >39.00)
LDL Cholesterol: 57 mg/dL (ref 0–99)
NonHDL: 72.81
Total CHOL/HDL Ratio: 2
Triglycerides: 80 mg/dL (ref 0.0–149.0)
VLDL: 16 mg/dL (ref 0.0–40.0)

## 2023-11-04 LAB — HEPATIC FUNCTION PANEL
ALT: 15 U/L (ref 0–35)
AST: 18 U/L (ref 0–37)
Albumin: 4.1 g/dL (ref 3.5–5.2)
Alkaline Phosphatase: 98 U/L (ref 39–117)
Bilirubin, Direct: 0.1 mg/dL (ref 0.0–0.3)
Total Bilirubin: 0.6 mg/dL (ref 0.2–1.2)
Total Protein: 7.2 g/dL (ref 6.0–8.3)

## 2023-11-04 LAB — BASIC METABOLIC PANEL WITH GFR
BUN: 15 mg/dL (ref 6–23)
CO2: 30 meq/L (ref 19–32)
Calcium: 9.9 mg/dL (ref 8.4–10.5)
Chloride: 94 meq/L — ABNORMAL LOW (ref 96–112)
Creatinine, Ser: 0.88 mg/dL (ref 0.40–1.20)
GFR: 58.03 mL/min — ABNORMAL LOW (ref >60.00)
Glucose, Bld: 153 mg/dL — ABNORMAL HIGH (ref 70–99)
Potassium: 4.5 meq/L (ref 3.5–5.1)
Sodium: 132 meq/L — ABNORMAL LOW (ref 135–145)

## 2023-11-04 LAB — URINALYSIS, ROUTINE W REFLEX MICROSCOPIC
Bilirubin Urine: NEGATIVE
Hgb urine dipstick: NEGATIVE
Leukocytes,Ua: NEGATIVE
Nitrite: NEGATIVE
RBC / HPF: NONE SEEN (ref 0–?)
Specific Gravity, Urine: 1.015 (ref 1.000–1.030)
Urine Glucose: NEGATIVE
Urobilinogen, UA: 0.2 (ref 0.0–1.0)
pH: 6.5 (ref 5.0–8.0)

## 2023-11-04 LAB — HEMOGLOBIN A1C: Hgb A1c MFr Bld: 7.7 % — ABNORMAL HIGH (ref 4.6–6.5)

## 2023-11-04 MED ORDER — TRIAMCINOLONE ACETONIDE 0.1 % EX CREA: 1.0000 | TOPICAL_CREAM | Freq: Two times a day (BID) | CUTANEOUS | 0 refills | Status: AC

## 2023-11-04 MED ORDER — HYDROCORTISONE 1 % EX CREA: 1.0000 | TOPICAL_CREAM | Freq: Two times a day (BID) | CUTANEOUS | 0 refills | Status: AC

## 2023-11-04 NOTE — Assessment & Plan Note (Signed)
 Dipstick positive for leukocyte and trace blood, negative for nitrite. -Urine culture positive for E. coli. -Continue cephalexin  twice daily for 5 days. -Increase fluid intake.

## 2023-11-04 NOTE — Telephone Encounter (Signed)
 Hydrocortisone  cream has been sent to the pharmacy.  Please advise patient to use it no more than twice a day.  If the symptoms are not improving we can start her on Medrol Dosepak as discussed during the office visit.

## 2023-11-04 NOTE — Telephone Encounter (Signed)
 Called Patient and let her know that Charanpreet Kaur called in the Hydrocortisone  Cream for her. Patient states she needs the tub size not the little tube. I also let Patient know not to use the cream more than twice a day and to let us  know if she is not getting better so we can call in a Medrol dosepak. Patient states she sees Dr. Glendia today.

## 2023-11-04 NOTE — Assessment & Plan Note (Signed)
 Erythematous rash on legs, arm and back. - Prescribe hydrocortisone  cream for topical application. - Recommend cold packs for symptom relief. - Discuss use of oral antihistamines like cetirizine for itching, to be taken at night. - Patient will let us  know if symptoms are not improving will start on Medrol Dosepak.

## 2023-11-04 NOTE — Patient Instructions (Signed)
 Use the cream twice a day. Do not use for more than 7-10 days.

## 2023-11-04 NOTE — Telephone Encounter (Unsigned)
 Copied from CRM [id number]. Topic: Clinical - Prescription Issue >> Nov 04, 2023 11:52 AM Chiquita SQUIBB wrote: Reason for CRM: Diu from [facility] pharmacy is calling in to let the provider know they got the 554 Grams (a pound) of the hydrocortisone  cream 1 % for the patient,  they ordered it and have it in but need the provider to re write it for 554 G of cream. A good call back number for any questions is [phone]

## 2023-11-04 NOTE — Telephone Encounter (Signed)
 Called and cancelled rx for hydrocortisone  per Dr Glendia and advised [facility] to [facility] for triamcinolone  that Dr Glendia just sent in.

## 2023-11-04 NOTE — Progress Notes (Signed)
 Subjective:    Patient ID: Cynthia  GORMAN [name], female    DOB: 13-Dec-1931, 89 y.o.   MRN: [id number]  Patient here for  Chief Complaint  Patient presents with   Medical Management of Chronic Issues    HPI Here for a scheduled follow up - f/u regarding her diabetes, hypertension and hypercholesterolemia. Sees Dr Maree - f/u myasthenia gravis. Seeing ortho for bilateral knee pain. S/p gel injections. Was seen work in appt 10/27/23 - dysuria and malodorous urine. Also had a rash. Treated with keflex , hydrocortisone  cream and loratadine . Reports persistent rash. Received triamcinolone  ointment. States needs the cream - feels works better for her. Persistent rash - legs and arms. Is some better. Also recently noticed vaginal discharge. None for a couple of days now. Took abx for urine. Symptoms improved. Request recheck to confirm no infection. No vomiting reported. No abdominal pain or bowel change reported.    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 Resected margins negative. Whole breast radiation   Cancer (HCC) 12/15/2012   left upper arm, malignant melanoma Clark level II, Breslow thickness: 0.35 mm.   Diabetes mellitus (HCC)    Hypercholesterolemia    Hypertension    Myasthenia gravis (HCC)    ocular   Personal history of radiation therapy 2014   Rt. Breast   S/P radiation therapy 2015   BREAST CA   Past Surgical History:  Procedure Laterality Date   BREAST BIOPSY Right 2014   DCIS   BREAST LUMPECTOMY Right 2014   Lumpectomy   BREAST SURGERY Right 2014   lumpectomy   EYE SURGERY     Caterac both eyes   Family History  Problem Relation Age of Onset   Skin cancer Father    Cancer Father        colon   Colon cancer Brother     Cancer Brother        colon   Breast cancer Other        niece   Skin cancer Sister    Social History   Socioeconomic History   Marital status: Divorced    Spouse name: Not on file   Number of children: Not on file   Years of education: Not on file   Highest education level: Not on file  Occupational History   Not on file  Tobacco Use   Smoking status: Never   Smokeless tobacco: Never  Substance and Sexual Activity   Alcohol use: No    Alcohol/week: 0.0 standard drinks of alcohol   Drug use: No   Sexual activity: Never  Other Topics Concern   Not on file  Social History Narrative   Not on file   Social Drivers of Health   Financial Resource Strain: Low Risk  (08/10/2023)   [facility]   Overall Financial Resource Strain (CARDIA)    Difficulty of Paying Living Expenses: Not hard at all  Food Insecurity: No Food Insecurity (08/10/2023)   Received from [facility]   Hunger Vital Sign    Within the past 12 months, you worried that your food would run out before you got the money  to buy more.: Never true    Within the past 12 months, the food you bought just didn't last and you didn't have money to get more.: Never true  Transportation Needs: Unmet Transportation Needs (08/10/2023)   Received from [facility] - Transportation    In the past 12 months, has lack of transportation kept you from medical appointments or from getting medications?: Yes    Lack of Transportation (Non-Medical): Yes  Physical Activity: Unknown (07/10/2020)   Exercise Vital Sign    Days of Exercise per Week: 0 days    Minutes of Exercise per Session: Not on file  Stress: No Stress Concern Present (07/11/2021)   [employer] of Occupational Health - Occupational Stress Questionnaire    Feeling of Stress : Not at all  Social Connections: Unknown (07/11/2021)   Social Connection and Isolation Panel    Frequency of  Communication with Friends and Family: More than three times a week    Frequency of Social Gatherings with Friends and Family: More than three times a week    Attends Religious Services: Not on Marketing executive or Organizations: Not on file    Attends Banker Meetings: Not on file    Marital Status: Not on file     Review of Systems  Constitutional:  Negative for appetite change and fever.  HENT:  Negative for congestion and sinus pressure.   Respiratory:  Negative for cough, chest tightness and shortness of breath.   Cardiovascular:  Negative for chest pain, palpitations and leg swelling.  Gastrointestinal:  Negative for abdominal pain, diarrhea and vomiting.  Genitourinary:  Negative for difficulty urinating, dysuria and vaginal discharge.  Musculoskeletal:  Negative for joint swelling and myalgias.  Skin:  Positive for rash. Negative for wound.  Neurological:  Negative for dizziness and headaches.  Psychiatric/Behavioral:  Negative for agitation and dysphoric mood.        Objective:     BP 128/70   Pulse 90   Resp 16   Ht 5' 3 (1.6 m)   Wt 133 lb (60.3 kg)   SpO2 98%   BMI 23.56 kg/m  Wt Readings from Last 3 Encounters:  11/04/23 133 lb (60.3 kg)  10/27/23 135 lb 3.2 oz (61.3 kg)  06/26/23 129 lb 9.6 oz (58.8 kg)    Physical Exam Vitals reviewed.  Constitutional:      General: She is not in acute distress.    Appearance: Normal appearance.  HENT:     Head: Normocephalic and atraumatic.     Right Ear: External ear normal.     Left Ear: External ear normal.     Mouth/Throat:     Pharynx: No oropharyngeal exudate or posterior oropharyngeal erythema.  Eyes:     General: No scleral icterus.       Right eye: No discharge.        Left eye: No discharge.     Conjunctiva/sclera: Conjunctivae normal.  Neck:     Thyroid : No thyromegaly.  Cardiovascular:     Rate and Rhythm: Normal rate and regular rhythm.  Pulmonary:     Effort: No  respiratory distress.     Breath sounds: Normal breath sounds. No wheezing.  Abdominal:     General: Bowel sounds are normal.     Palpations: Abdomen is soft.     Tenderness: There is no abdominal tenderness.  Musculoskeletal:        General: No  swelling or tenderness.     Cervical back: Neck supple. No tenderness.  Lymphadenopathy:     Cervical: No cervical adenopathy.  Skin:    Comments: Rash - lower legs and arms.   Neurological:     Mental Status: She is alert.  Psychiatric:        Mood and Affect: Mood normal.        Behavior: Behavior normal.         Outpatient Encounter Medications as of 11/04/2023  Medication Sig   triamcinolone  cream (KENALOG ) 0.1 % Apply 1 Application topically 2 (two) times daily.   ACCU-CHEK SOFTCLIX LANCETS lancets USE ONCE A DAY   amLODipine  (NORVASC ) 10 MG tablet TAKE 1 TABLET BY MOUTH DAILY   aspirin 81 MG tablet Take 81 mg by mouth daily.   atorvastatin  (LIPITOR) 20 MG tablet TAKE 1 TABLET BY MOUTH DAILY   Calcium  Carbonate-Vitamin D 600-400 MG-UNIT tablet Take 1 tablet by mouth 2 (two) times daily.   cephALEXin  (KEFLEX ) 500 MG capsule Take 1 capsule (500 mg total) by mouth 2 (two) times daily.   CRANBERRY PO Take by mouth daily.   glucose blood (ACCU-CHEK AVIVA PLUS) test strip USE AS INSTRUCTED TO CHECK  BLOOD SUGAR ONCE DAILY AS  NEEDED   hydrochlorothiazide  (HYDRODIURIL ) 25 MG tablet TAKE 1 TABLET BY MOUTH DAILY   hydrocortisone  cream 1 % Apply 1 Application topically 2 (two) times daily.   loratadine  (CLARITIN ) 10 MG tablet Take 1 tablet (10 mg total) by mouth daily.   Multiple Vitamins-Minerals (ICAPS AREDS 2) CAPS Take 1 capsule by mouth 2 (two) times daily.   Multiple Vitamins-Minerals (PRESERVISION AREDS 2+MULTI VIT PO) Take by mouth.   potassium chloride  (KLOR-CON ) 10 MEQ tablet TAKE 1 TABLET BY MOUTH TWICE  DAILY   ramipril  (ALTACE ) 10 MG capsule TAKE 1 CAPSULE BY MOUTH DAILY   traZODone  (DESYREL ) 50 MG tablet TAKE 1 TABLET BY  MOUTH AT  BEDTIME AS NEEDED FOR SLEEP   [DISCONTINUED] hydrocortisone  1 % ointment Apply 1 Application topically 2 (two) times daily.   No facility-administered encounter medications on file as of 11/04/2023.     Lab Results  Component Value Date   WBC 7.4 06/26/2023   HGB 13.1 06/26/2023   HCT 40.7 06/26/2023   PLT 246.0 06/26/2023   GLUCOSE 153 (H) 11/04/2023   CHOL 132 11/04/2023   TRIG 80.0 11/04/2023   HDL 59.00 11/04/2023   LDLCALC 57 11/04/2023   ALT 15 11/04/2023   AST 18 11/04/2023   NA 132 (L) 11/04/2023   K 4.5 11/04/2023   CL 94 (L) 11/04/2023   CREATININE 0.88 11/04/2023   BUN 15 11/04/2023   CO2 30 11/04/2023   TSH 0.51 06/26/2023   HGBA1C 7.7 (H) 11/04/2023    MR LUMBAR SPINE WO CONTRAST Result Date: 05/26/2022 CLINICAL DATA:  Low back pain radiating down both legs, right worse than left EXAM: MRI LUMBAR SPINE WITHOUT CONTRAST TECHNIQUE: Multiplanar, multisequence MR imaging of the lumbar spine was performed. No intravenous contrast was administered. COMPARISON:  08/09/2015 FINDINGS: Segmentation:  Standard. Alignment:  2 mm retrolisthesis of L2 on L3, L3 on L4 and L5 on S1. Vertebrae: No acute fracture, evidence of discitis, or aggressive bone lesion. Large vertebral body hemangioma in the L1 vertebral body. Small vertebral body hemangioma in the T12 vertebral body. Conus medullaris and cauda equina: Conus extends to the L1 level. Conus and cauda equina appear normal. Paraspinal and other soft tissues: No acute paraspinal abnormality. Disc levels:  Disc spaces: Degenerative disease with disc height loss at T11-12, L1-2, L2-3, L3-4, L5-S1 and to lesser extent L4-5. T11-12: Mild broad-based disc bulge. No foraminal or central canal stenosis. T12-L1: Mild broad-based disc bulge. No foraminal or central canal stenosis. L1-L2: Broad-based disc bulge. Mild bilateral facet arthropathy. Mild spinal stenosis. Mild left foraminal stenosis. No right foraminal stenosis. L2-L3:  Broad-based disc bulge. Mild bilateral facet arthropathy. Mild-moderate spinal stenosis. Bilateral subarticular recess stenosis. Moderate bilateral foraminal stenosis. L3-L4: Broad-based disc bulge. Mild bilateral facet arthropathy. Left subarticular recess stenosis. Severe left foraminal stenosis. No right foraminal stenosis. L4-L5: Broad-based disc bulge with a broad right subarticular disc protrusion impinging upon the right intraspinal L5 nerve root. Severe left facet arthropathy and mild right facet arthropathy. Mild-moderate spinal stenosis. Left subarticular recess stenosis. Severe left foraminal stenosis. No right foraminal stenosis. L5-S1: Broad-based disc bulge. Mild bilateral facet arthropathy. Bilateral subarticular recess stenosis. Moderate-severe left foraminal stenosis. Mild-moderate right foraminal stenosis. No spinal stenosis. IMPRESSION: 1. Diffuse lumbar spine spondylosis as described above. 2. No acute osseous injury of the lumbar spine. Electronically Signed   By: Julaine Blanch M.D.   On: 05/26/2022 07:20       Assessment & Plan:  Dysuria Assessment & Plan: Check urine to confirm no infection.   Orders: -     Urinalysis, Routine w reflex microscopic -     Urine Culture  Primary hypertension Assessment & Plan: Blood pressure as outlined.  Continue amlodipine , ramipril  and hctz. No changes today.  Follow pressures. Check metabolic panel.   Orders: -     Basic metabolic panel with GFR  Type 2 diabetes mellitus with hyperglycemia, without long-term current use of insulin (HCC) Assessment & Plan: Will check met b and A1c today. Will hold on oral steroids. Treat rash with TCC.   Orders: -     Hemoglobin A1c  Hypercholesterolemia Assessment & Plan: On lipitor.  Low cholesterol diet and exercise.  Check lipid panel today. No changes today.   Orders: -     Hepatic function panel -     Lipid panel  Rash Assessment & Plan: Persistent rash. Request cream. Does not feel  the ointment helps as well as the cream. Has improved some per report. Will prescribe triamcinolone  cream. Call with update. Do not use on face or genital area.    Myasthenia gravis [facility]) Assessment & Plan: Has been followed by Dr Maree previously.  Stable.    Depression, major, single episode, mild (HCC) Assessment & Plan: Increased stress. Have discussed psychiatry referral. She has declined. Will follow. Notify me if she feels she needs further intervention.    Ductal carcinoma in situ (DCIS) of right breast Assessment & Plan: Saw Dr Dessa 01/29/21.  Mammogram 01/22/21 - Birads I.  Have discussed continuing yearly mammograms. She declines further mammogram testing.    Other orders -     Triamcinolone  Acetonide; Apply 1 Application topically 2 (two) times daily.  Dispense: 453.6 g; Refill: 0     Allena Hamilton, MD

## 2023-11-05 ENCOUNTER — Ambulatory Visit: Payer: Self-pay | Admitting: Internal Medicine

## 2023-11-05 DIAGNOSIS — E871 Hypo-osmolality and hyponatremia: Secondary | ICD-10-CM

## 2023-11-05 LAB — URINE CULTURE
MICRO NUMBER:: 16949301
SPECIMEN QUALITY:: ADEQUATE

## 2023-11-09 ENCOUNTER — Encounter: Payer: Self-pay | Admitting: Internal Medicine

## 2023-11-09 NOTE — Assessment & Plan Note (Signed)
 Persistent rash. Request cream. Does not feel the ointment helps as well as the cream. Has improved some per report. Will prescribe triamcinolone  cream. Call with update. Do not use on face or genital area.

## 2023-11-09 NOTE — Assessment & Plan Note (Signed)
 Blood pressure as outlined.  Continue amlodipine , ramipril  and hctz. No changes today.  Follow pressures. Check metabolic panel.

## 2023-11-09 NOTE — Assessment & Plan Note (Signed)
 Will check met b and A1c today. Will hold on oral steroids. Treat rash with TCC.

## 2023-11-09 NOTE — Assessment & Plan Note (Signed)
Check urine to confirm no infection.  

## 2023-11-09 NOTE — Assessment & Plan Note (Signed)
 Saw Dr Marquita Situ 01/29/21.  Mammogram 01/22/21 - Birads I.  Have discussed continuing yearly mammograms. She declines further mammogram testing.

## 2023-11-09 NOTE — Assessment & Plan Note (Signed)
 Increased stress. Have discussed psychiatry referral. She has declined. Will follow. Notify me if she feels she needs further intervention.

## 2023-11-09 NOTE — Assessment & Plan Note (Signed)
 On lipitor.  Low cholesterol diet and exercise.  Check lipid panel today. No changes today.

## 2023-11-09 NOTE — Assessment & Plan Note (Signed)
 Has been followed by Dr Sherryll Burger previously.  Stable.

## 2023-11-10 NOTE — Telephone Encounter (Unsigned)
 Copied from CRM [id number]. Topic: Clinical - Medication Question >> Nov 10, 2023  8:39 AM Thersia BROCKS wrote: Reason for CRM: Patient called in would like trish to give her a callback patient wanted to know if she needs to fast for lab appt also has a question about the medication that she is taking.

## 2023-11-11 ENCOUNTER — Other Ambulatory Visit (INDEPENDENT_AMBULATORY_CARE_PROVIDER_SITE_OTHER)

## 2023-11-11 ENCOUNTER — Other Ambulatory Visit: Payer: Self-pay | Admitting: *Deleted

## 2023-11-11 DIAGNOSIS — R300 Dysuria: Secondary | ICD-10-CM

## 2023-11-11 DIAGNOSIS — E871 Hypo-osmolality and hyponatremia: Secondary | ICD-10-CM

## 2023-11-11 NOTE — Telephone Encounter (Signed)
 I received a secure chat message from lab that stated - patient was seen for a lab appt today. She brought a urine sample with her and stated she is still having issues using the bathroom. I took the sample from her and stated I would reach out to you for next steps if any. Please call and see what symptoms pt is having. I just saw her for follow up appt and urine culture was negative.

## 2023-11-12 LAB — URINALYSIS, ROUTINE W REFLEX MICROSCOPIC
Bilirubin Urine: NEGATIVE
Hgb urine dipstick: NEGATIVE
Ketones, ur: NEGATIVE
Nitrite: NEGATIVE
Specific Gravity, Urine: 1.01 (ref 1.000–1.030)
Total Protein, Urine: NEGATIVE
Urine Glucose: NEGATIVE
Urobilinogen, UA: 0.2 (ref 0.0–1.0)
pH: 7 (ref 5.0–8.0)

## 2023-11-12 LAB — SODIUM: Sodium: 132 meq/L — ABNORMAL LOW (ref 135–145)

## 2023-11-12 NOTE — Telephone Encounter (Unsigned)
 Copied from CRM [id number]. Topic: General - Other >> Nov 12, 2023 10:36 AM Berneda FALCON wrote: Reason for CRM: Pt states she is returning phone call to [facility] and wanted her to know that she had a UTI and it came back negative but when she urinates, there is burning with it for about a week now. No other symptoms noted.

## 2023-11-13 ENCOUNTER — Ambulatory Visit: Payer: Self-pay | Admitting: Internal Medicine

## 2023-11-13 MED ORDER — CEFDINIR 300 MG PO CAPS: 300.0000 mg | ORAL_CAPSULE | Freq: Two times a day (BID) | ORAL | 0 refills | Status: DC

## 2023-11-14 LAB — URINE CULTURE
MICRO NUMBER:: 16980207
SPECIMEN QUALITY:: ADEQUATE

## 2023-11-17 NOTE — Telephone Encounter (Signed)
 Copied from CRM [id number]. Topic: Clinical - Lab/Test Results >> Nov 17, 2023  9:15 AM Turkey A wrote: Reason for CRM: Patient returned call to [facility] regarding labs. Patient is feeling ok but would like to speak with Trish.

## 2023-11-21 ENCOUNTER — Other Ambulatory Visit: Payer: Self-pay | Admitting: Nurse Practitioner

## 2023-11-23 ENCOUNTER — Other Ambulatory Visit: Payer: Self-pay

## 2023-11-23 MED ORDER — LORATADINE 10 MG PO TABS: 10.0000 mg | ORAL_TABLET | Freq: Every day | ORAL | 0 refills | Status: AC

## 2023-11-25 ENCOUNTER — Other Ambulatory Visit

## 2023-11-25 ENCOUNTER — Other Ambulatory Visit: Payer: Self-pay

## 2023-11-25 ENCOUNTER — Ambulatory Visit: Payer: Self-pay

## 2023-11-25 DIAGNOSIS — R300 Dysuria: Secondary | ICD-10-CM

## 2023-11-25 NOTE — Telephone Encounter (Signed)
 Ok to check urine. Will need appt if does not clear.

## 2023-11-25 NOTE — Telephone Encounter (Signed)
 Orders placed and lab appt scheduled. Patient is aware of below.

## 2023-11-25 NOTE — Addendum Note (Signed)
 Addended by: TANDA HARVEY D on: 11/25/2023 01:42 PM   Modules accepted: Orders

## 2023-11-25 NOTE — Telephone Encounter (Signed)
See me about this.

## 2023-11-25 NOTE — Telephone Encounter (Signed)
 FYI Only or Action Required?: Action required by provider: clinical question for provider.  Patient was last seen in primary care on 11/04/2023 by Glendia Shad, MD.  Called Nurse Triage reporting No chief complaint on file..  Symptoms began several days ago.  Interventions attempted: Nothing.  Symptoms are: unchanged.  Triage Disposition: See Physician Within 24 Hours  Patient/caregiver understands and will follow disposition?: No, wishes to speak with PCP  Copied from CRM #[id number]. Topic: Clinical - Red Word Triage >> Nov 25, 2023 10:04 AM Timindy P wrote: [facility] that prompted transfer to Nurse Triage: Pt is being treated for a kidney infection and medication is not helping. She is stating she feels like something isnt right/not herself Reason for Disposition  [1] Taking antibiotic > 72 hours (3 days) for UTI AND [2] painful urination or frequency is SAME (unchanged, not better)    Last took antibiotic 11/17/23, reports not better  Answer Assessment - Initial Assessment Questions Patient requesting Call Back, reports was told by Trish to call back if no improvement. No available appts today. Advised UC today or ED if symptoms worsen.  1. MAIN SYMPTOM: What is the main symptom you are concerned about? (e.g., painful urination, urine frequency)     Painful urination 2. BETTER-SAME-WORSE: Are you getting better, staying the same, or getting worse compared to how you felt at your last visit to the doctor (most recent medical visit)?     I don't feel the same, it hurts by not as bad; think it's not working 3. PAIN: How bad is the pain?  (e.g., Scale 1-10; mild, moderate, or severe)     Does not have pain; little twinge 4. FEVER: Do you have a fever? If Yes, ask: What is it, how was it measured, and when did it start?     denies 5. OTHER SYMPTOMS: Do you have any other symptoms? (e.g., blood in the urine, flank pain, vaginal discharge)     Denies fever, chills, abd pain,  back pain,n/v no odor, blood 6. DIAGNOSIS: When was the UTI diagnosed? By whom? Was it a kidney infection, bladder infection or both?     Kidney infection 7. ANTIBIOTIC: What antibiotic(s) are you taking? How many times per day?     Cedfinir-completed 8. ANTIBIOTIC - START DATE: When did you start taking the antibiotic? 11/13/23  Protocols used: Urinary Tract Infection on Antibiotic Follow-up Call - [facility]

## 2023-11-26 ENCOUNTER — Ambulatory Visit: Payer: Self-pay | Admitting: Internal Medicine

## 2023-11-26 LAB — MICROSCOPIC MESSAGE

## 2023-11-26 LAB — URINE CULTURE
MICRO NUMBER:: 17042288
Result:: NO GROWTH
SPECIMEN QUALITY:: ADEQUATE

## 2023-11-26 LAB — URINALYSIS, ROUTINE W REFLEX MICROSCOPIC
Bacteria, UA: NONE SEEN /HPF
Bilirubin Urine: NEGATIVE
Glucose, UA: NEGATIVE
Hgb urine dipstick: NEGATIVE
Hyaline Cast: NONE SEEN /LPF
Ketones, ur: NEGATIVE
Nitrite: NEGATIVE
Protein, ur: NEGATIVE
RBC / HPF: NONE SEEN /HPF (ref 0–2)
Specific Gravity, Urine: 1.011 (ref 1.001–1.035)
pH: 6.5 (ref 5.0–8.0)

## 2024-01-12 ENCOUNTER — Other Ambulatory Visit: Payer: Self-pay | Admitting: Internal Medicine

## 2024-01-20 ENCOUNTER — Telehealth: Payer: Self-pay

## 2024-01-20 NOTE — Telephone Encounter (Signed)
 I have notified patient that she does not need to fast and she is too early to recheck her A1C.

## 2024-01-20 NOTE — Telephone Encounter (Signed)
 Copied from CRM [id number]. Topic: Clinical - Request for Lab/Test Order >> Jan 20, 2024  9:40 AM Macario HERO wrote: Reason for CRM: Patient called asking if it's time for her to check her sugar when she comes in 12/2? She stated she would rather do it that day and not eat so she won't have to come back again. Requesting a call back.

## 2024-01-26 ENCOUNTER — Ambulatory Visit: Admitting: Internal Medicine

## 2024-01-26 ENCOUNTER — Encounter: Payer: Self-pay | Admitting: Internal Medicine

## 2024-01-26 VITALS — BP 126/70 | HR 81 | Temp 98.8°F | Ht 63.0 in | Wt 133.6 lb

## 2024-01-26 DIAGNOSIS — Z23 Encounter for immunization: Secondary | ICD-10-CM | POA: Diagnosis not present

## 2024-01-26 DIAGNOSIS — E7800 Pure hypercholesterolemia, unspecified: Secondary | ICD-10-CM | POA: Diagnosis not present

## 2024-01-26 DIAGNOSIS — G7000 Myasthenia gravis without (acute) exacerbation: Secondary | ICD-10-CM

## 2024-01-26 DIAGNOSIS — E1165 Type 2 diabetes mellitus with hyperglycemia: Secondary | ICD-10-CM

## 2024-01-26 DIAGNOSIS — I10 Essential (primary) hypertension: Secondary | ICD-10-CM | POA: Diagnosis not present

## 2024-01-26 MED ORDER — TRAZODONE HCL 50 MG PO TABS: 50.0000 mg | ORAL_TABLET | Freq: Every evening | ORAL | 1 refills | Status: AC | PRN

## 2024-01-26 MED ORDER — RAMIPRIL 10 MG PO CAPS: 10.0000 mg | ORAL_CAPSULE | Freq: Every day | ORAL | 3 refills | Status: AC

## 2024-01-26 NOTE — Progress Notes (Unsigned)
 Subjective:    Patient ID: Cynthia  GORMAN [name], female    DOB: 05-Sep-1931, 89 y.o.   MRN: [id number]  Patient here for  Chief Complaint  Patient presents with   Medical Management of Chronic Issues    HPI Here for a scheduled follow up - f/u regarding her diabetes, hypertension and hypercholesterolemia. Sees Dr Maree - f/u myasthenia gravis. Stable. Seeing ortho for bilateral knee pain. S/p gel injections.  saw podiatry - 12/17/23 - toenail - trimmed and filed. Recommended ABIs. Unsure of results. Overall appears to be doing better. No chest pain or sob reported. No abdominal pain or bowel change reported.    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 Resected margins negative. Whole breast radiation   Cancer (HCC) 12/15/2012   left upper arm, malignant melanoma Clark level II, Breslow thickness: 0.35 mm.   Diabetes mellitus (HCC)    Hypercholesterolemia    Hypertension    Myasthenia gravis (HCC)    ocular   Personal history of radiation therapy 2014   Rt. Breast   S/P radiation therapy 2015   BREAST CA   Past Surgical History:  Procedure Laterality Date   BREAST BIOPSY Right 2014   DCIS   BREAST LUMPECTOMY Right 2014   Lumpectomy   BREAST SURGERY Right 2014   lumpectomy   EYE SURGERY     Caterac both eyes   Family History  Problem Relation Age of Onset   Skin cancer Father    Cancer Father        colon   Colon cancer Brother    Cancer Brother        colon   Breast cancer Other        niece   Skin cancer Sister    Social History   Socioeconomic History   Marital status: Divorced    Spouse name: Not on file   Number of children: Not on file   Years of education: Not on file   Highest education level: Not on file   Occupational History   Not on file  Tobacco Use   Smoking status: Never   Smokeless tobacco: Never  Substance and Sexual Activity   Alcohol use: No    Alcohol/week: 0.0 standard drinks of alcohol   Drug use: No   Sexual activity: Never  Other Topics Concern   Not on file  Social History Narrative   Not on file   Social Drivers of Health   Financial Resource Strain: Low Risk  (08/10/2023)   Received from [facility]   Overall Financial Resource Strain (CARDIA)    Difficulty of Paying Living Expenses: Not hard at all  Food Insecurity: No Food Insecurity (08/10/2023)   Received from [facility]   Hunger Vital Sign    Within the past 12 months, you worried that your food would run out before you got the money to buy more.: Never true    Within the past 12 months, the food you bought just didn't last and you didn't have money to get more.: Never true  Transportation Needs: Unmet Transportation Needs (08/10/2023)   Received from [facility]  PRAPARE - Transportation    In the past 12 months, has lack of transportation kept you from medical appointments or from getting medications?: Yes    Lack of Transportation (Non-Medical): Yes  Physical Activity: Unknown (07/10/2020)   Exercise Vital Sign    Days of Exercise per Week: 0 days    Minutes of Exercise per Session: Not on file  Stress: No Stress Concern Present (07/11/2021)   [employer] of Occupational Health - Occupational Stress Questionnaire    Feeling of Stress : Not at all  Social Connections: Unknown (07/11/2021)   Social Connection and Isolation Panel    Frequency of Communication with Friends and Family: More than three times a week    Frequency of Social Gatherings with Friends and Family: More than three times a week    Attends Religious Services: Not on Marketing Executive or Organizations: Not on file    Attends Banker Meetings: Not on  file    Marital Status: Not on file     Review of Systems  Constitutional:  Negative for appetite change and unexpected weight change.  HENT:  Negative for congestion and sinus pressure.   Respiratory:  Negative for cough, chest tightness and shortness of breath.   Cardiovascular:  Negative for chest pain, palpitations and leg swelling.  Gastrointestinal:  Negative for diarrhea, nausea and vomiting.  Genitourinary:  Negative for difficulty urinating and dysuria.  Musculoskeletal:  Negative for joint swelling and myalgias.  Skin:  Negative for color change and rash.  Neurological:  Negative for dizziness and headaches.  Psychiatric/Behavioral:  Negative for agitation and dysphoric mood.        Objective:     BP 126/70   Pulse 81   Temp 98.8 F (37.1 C) (Oral)   Ht 5' 3 (1.6 m)   Wt 133 lb 9.6 oz (60.6 kg)   SpO2 94%   BMI 23.67 kg/m  Wt Readings from Last 3 Encounters:  01/26/24 133 lb 9.6 oz (60.6 kg)  11/04/23 133 lb (60.3 kg)  10/27/23 135 lb 3.2 oz (61.3 kg)    Physical Exam Vitals reviewed.  Constitutional:      General: She is not in acute distress.    Appearance: Normal appearance.  HENT:     Head: Normocephalic and atraumatic.     Right Ear: External ear normal.     Left Ear: External ear normal.     Mouth/Throat:     Pharynx: No oropharyngeal exudate or posterior oropharyngeal erythema.  Eyes:     General: No scleral icterus.       Right eye: No discharge.        Left eye: No discharge.     Conjunctiva/sclera: Conjunctivae normal.  Neck:     Thyroid : No thyromegaly.  Cardiovascular:     Rate and Rhythm: Normal rate and regular rhythm.  Pulmonary:     Effort: No respiratory distress.     Breath sounds: Normal breath sounds. No wheezing.  Abdominal:     General: Bowel sounds are normal.     Palpations: Abdomen is soft.     Tenderness: There is no abdominal tenderness.  Musculoskeletal:        General: No swelling or tenderness.     Cervical  back: Neck supple. No tenderness.  Lymphadenopathy:     Cervical: No cervical adenopathy.  Skin:    Findings: No erythema or rash.  Neurological:     Mental Status: She  is alert.  Psychiatric:        Mood and Affect: Mood normal.        Behavior: Behavior normal.         Outpatient Encounter Medications as of 01/26/2024  Medication Sig   ACCU-CHEK SOFTCLIX LANCETS lancets USE ONCE A DAY   amLODipine  (NORVASC ) 10 MG tablet TAKE 1 TABLET BY MOUTH DAILY   aspirin 81 MG tablet Take 81 mg by mouth daily.   atorvastatin  (LIPITOR) 20 MG tablet TAKE 1 TABLET BY MOUTH DAILY   Calcium  Carbonate-Vitamin D 600-400 MG-UNIT tablet Take 1 tablet by mouth 2 (two) times daily.   CRANBERRY PO Take by mouth daily.   glucose blood (ACCU-CHEK AVIVA PLUS) test strip USE AS INSTRUCTED TO CHECK  BLOOD SUGAR ONCE DAILY AS  NEEDED   hydrochlorothiazide  (HYDRODIURIL ) 25 MG tablet TAKE 1 TABLET BY MOUTH DAILY   hydrocortisone  cream 1 % Apply 1 Application topically 2 (two) times daily.   loratadine  (CLARITIN ) 10 MG tablet Take 1 tablet (10 mg total) by mouth daily.   Multiple Vitamins-Minerals (ICAPS AREDS 2) CAPS Take 1 capsule by mouth 2 (two) times daily.   Multiple Vitamins-Minerals (PRESERVISION AREDS 2+MULTI VIT PO) Take by mouth.   potassium chloride  (KLOR-CON ) 10 MEQ tablet TAKE 1 TABLET BY MOUTH TWICE  DAILY   triamcinolone  cream (KENALOG ) 0.1 % Apply 1 Application topically 2 (two) times daily.   ramipril  (ALTACE ) 10 MG capsule Take 1 capsule (10 mg total) by mouth daily.   traZODone  (DESYREL ) 50 MG tablet Take 1 tablet (50 mg total) by mouth at bedtime as needed. for sleep   [DISCONTINUED] cefdinir  (OMNICEF ) 300 MG capsule Take 1 capsule (300 mg total) by mouth 2 (two) times daily.   [DISCONTINUED] cephALEXin  (KEFLEX ) 500 MG capsule Take 1 capsule (500 mg total) by mouth 2 (two) times daily.   [DISCONTINUED] ramipril  (ALTACE ) 10 MG capsule TAKE 1 CAPSULE BY MOUTH DAILY   [DISCONTINUED] traZODone   (DESYREL ) 50 MG tablet TAKE 1 TABLET BY MOUTH AT  BEDTIME AS NEEDED FOR SLEEP   No facility-administered encounter medications on file as of 01/26/2024.     Lab Results  Component Value Date   WBC 7.4 06/26/2023   HGB 13.1 06/26/2023   HCT 40.7 06/26/2023   PLT 246.0 06/26/2023   GLUCOSE 153 (H) 11/04/2023   CHOL 132 11/04/2023   TRIG 80.0 11/04/2023   HDL 59.00 11/04/2023   LDLCALC 57 11/04/2023   ALT 15 11/04/2023   AST 18 11/04/2023   NA 132 (L) 11/11/2023   K 4.5 11/04/2023   CL 94 (L) 11/04/2023   CREATININE 0.88 11/04/2023   BUN 15 11/04/2023   CO2 30 11/04/2023   TSH 0.51 06/26/2023   HGBA1C 7.7 (H) 11/04/2023    MR LUMBAR SPINE WO CONTRAST Result Date: 05/26/2022 CLINICAL DATA:  Low back pain radiating down both legs, right worse than left EXAM: MRI LUMBAR SPINE WITHOUT CONTRAST TECHNIQUE: Multiplanar, multisequence MR imaging of the lumbar spine was performed. No intravenous contrast was administered. COMPARISON:  08/09/2015 FINDINGS: Segmentation:  Standard. Alignment:  2 mm retrolisthesis of L2 on L3, L3 on L4 and L5 on S1. Vertebrae: No acute fracture, evidence of discitis, or aggressive bone lesion. Large vertebral body hemangioma in the L1 vertebral body. Small vertebral body hemangioma in the T12 vertebral body. Conus medullaris and cauda equina: Conus extends to the L1 level. Conus and cauda equina appear normal. Paraspinal and other soft tissues: No acute paraspinal abnormality. Disc levels:  Disc spaces: Degenerative disease with disc height loss at T11-12, L1-2, L2-3, L3-4, L5-S1 and to lesser extent L4-5. T11-12: Mild broad-based disc bulge. No foraminal or central canal stenosis. T12-L1: Mild broad-based disc bulge. No foraminal or central canal stenosis. L1-L2: Broad-based disc bulge. Mild bilateral facet arthropathy. Mild spinal stenosis. Mild left foraminal stenosis. No right foraminal stenosis. L2-L3: Broad-based disc bulge. Mild bilateral facet arthropathy.  Mild-moderate spinal stenosis. Bilateral subarticular recess stenosis. Moderate bilateral foraminal stenosis. L3-L4: Broad-based disc bulge. Mild bilateral facet arthropathy. Left subarticular recess stenosis. Severe left foraminal stenosis. No right foraminal stenosis. L4-L5: Broad-based disc bulge with a broad right subarticular disc protrusion impinging upon the right intraspinal L5 nerve root. Severe left facet arthropathy and mild right facet arthropathy. Mild-moderate spinal stenosis. Left subarticular recess stenosis. Severe left foraminal stenosis. No right foraminal stenosis. L5-S1: Broad-based disc bulge. Mild bilateral facet arthropathy. Bilateral subarticular recess stenosis. Moderate-severe left foraminal stenosis. Mild-moderate right foraminal stenosis. No spinal stenosis. IMPRESSION: 1. Diffuse lumbar spine spondylosis as described above. 2. No acute osseous injury of the lumbar spine. Electronically Signed   By: Julaine Blanch M.D.   On: 05/26/2022 07:20       Assessment & Plan:  Myasthenia gravis [facility]) Assessment & Plan: Has been followed by Dr Maree previously.  Stable.    Type 2 diabetes mellitus with hyperglycemia, without long-term current use of insulin (HCC) Assessment & Plan: On no medication. Follow met b and A1c.   Lab Results  Component Value Date   HGBA1C 7.7 (H) 11/04/2023     Orders: -     Hemoglobin A1c; Future  Hypercholesterolemia Assessment & Plan: On lipitor.  Low cholesterol diet and exercise.  Follow lipid panel.   Orders: -     Hepatic function panel; Future -     Lipid panel; Future  Primary hypertension Assessment & Plan: Blood pressure as outlined.  Continue amlodipine , ramipril  and hctz. No changes today.  Follow pressures. Follow metabolic panel.   Orders: -     Basic metabolic panel with GFR; Future  Need for influenza vaccination -     Flu vaccine HIGH DOSE PF(Fluzone Trivalent)  Other orders -     Ramipril ; Take 1 capsule (10 mg  total) by mouth daily.  Dispense: 90 capsule; Refill: 3 -     traZODone  HCl; Take 1 tablet (50 mg total) by mouth at bedtime as needed. for sleep  Dispense: 90 tablet; Refill: 1     Allena Hamilton, MD

## 2024-01-31 ENCOUNTER — Encounter: Payer: Self-pay | Admitting: Internal Medicine

## 2024-01-31 NOTE — Assessment & Plan Note (Signed)
On lipitor.  Low cholesterol diet and exercise.  Follow lipid panel.   

## 2024-01-31 NOTE — Assessment & Plan Note (Signed)
 Has been followed by Dr Sherryll Burger previously.  Stable.

## 2024-01-31 NOTE — Assessment & Plan Note (Signed)
 On no medication. Follow met b and A1c.   Lab Results  Component Value Date   HGBA1C 7.7 (H) 11/04/2023

## 2024-01-31 NOTE — Assessment & Plan Note (Signed)
 Blood pressure as outlined.  Continue amlodipine , ramipril  and hctz. No changes today.  Follow pressures. Follow metabolic panel.

## 2024-02-10 ENCOUNTER — Ambulatory Visit: Payer: Self-pay

## 2024-02-10 NOTE — Telephone Encounter (Signed)
 Please call her and let her know that if she is having increased cough and congestion, she does need to be evaluated. Let her know that I will be out of office and I do feel she needs to be evaluated.

## 2024-02-10 NOTE — Telephone Encounter (Signed)
 Tried to reach back out to pt to work in appt. Pt line is busy. Will try to call back tomorrow morning

## 2024-02-10 NOTE — Telephone Encounter (Signed)
 Noted. Agree with f/u to see how she is doing.

## 2024-02-10 NOTE — Telephone Encounter (Signed)
°  FYI Only or Action Required?: Action required by provider: request for appointment and update on patient condition.  Patient was last seen in primary care on 01/26/2024 by Glendia Shad, MD.  Called Nurse Triage reporting Cough.  Symptoms began several days ago.  Interventions attempted: OTC medications: Equate allergy relief.  Symptoms are: stable.  Triage Disposition: Home Care  Patient/caregiver understands and will follow disposition?: Message from Franky GRADE sent at 02/10/2024  9:33 AM EST  Reason for Triage: Patient is calling because she got the high dose flu vaccine last week and has since developed what patient states is a chest cold, cough and congestion. Patient is not willing to see any other provider only wants to see Dr.Scott.   Reason for Disposition  Cough  Answer Assessment - Initial Assessment Questions 1. ONSET: When did the cough begin?      Saturday, 3 days   2. SEVERITY: How bad is the cough today?       Unchanged since onset  3. SPUTUM: Describe the color of your sputum (e.g., none, dry cough; clear, white, yellow, green) denies       4. HEMOPTYSIS: Are you coughing up any blood? If Yes, ask: How much? (e.g., flecks, streaks, tablespoons, etc.) denies      5. DIFFICULTY BREATHING: Are you having difficulty breathing? If Yes, ask: How bad is it? (e.g., mild, moderate, severe)  denies      6. FEVER: Do you have a fever? If Yes, ask: What is your temperature, how was it measured, and when did it start? denies       7. CARDIAC HISTORY: Do you have any history of heart disease? (e.g., heart attack, congestive heart failure)  Per pt's chart,     pt has hx of HTN  8. LUNG HISTORY: Do you have any history of lung disease?  (e.g., pulmonary embolus, asthma, emphysema)     Per pt's chart,    pt does not have lung hx   9. PE RISK FACTORS: Do you have a history of blood clots? (or: recent major surgery, recent prolonged travel,  bedridden)     Per pt's chart,   pt does not have pe risk factors    10. OTHER SYMPTOMS: Pt denies wheezing, chest pain       Pt states nasal drainage is white in color.  Pt reports cough and runny nose Pt is taking OTC Equate allergy relief 10 mg Attempted to schedule an appt with pt's PCP, however, soonest appt is in Feb. Pt states she would only see her PCP. It appt is not required, if the primary care provider sees fit to call in medications for her symptoms, pt states that would be fine. Pt agrees with plan of care, will call back for any worsening symptoms  Protocols used: Cough - Acute Non-Productive-A-AH

## 2024-02-20 ENCOUNTER — Other Ambulatory Visit: Payer: Self-pay | Admitting: Internal Medicine

## 2024-05-30 ENCOUNTER — Encounter: Admitting: Internal Medicine
